# Patient Record
Sex: Female | Born: 1965 | Race: Black or African American | Hispanic: No | Marital: Single | Smoking: Never smoker
Health system: Southern US, Community
[De-identification: ages and names within clinical notes are randomized; demographics above are authoritative.]

## PROBLEM LIST (undated history)

## (undated) DIAGNOSIS — K219 Gastro-esophageal reflux disease without esophagitis: Secondary | ICD-10-CM

## (undated) DIAGNOSIS — E669 Obesity, unspecified: Secondary | ICD-10-CM

## (undated) DIAGNOSIS — G709 Myoneural disorder, unspecified: Secondary | ICD-10-CM

## (undated) DIAGNOSIS — R7303 Prediabetes: Secondary | ICD-10-CM

## (undated) DIAGNOSIS — D649 Anemia, unspecified: Secondary | ICD-10-CM

## (undated) DIAGNOSIS — I10 Essential (primary) hypertension: Secondary | ICD-10-CM

## (undated) DIAGNOSIS — M1990 Unspecified osteoarthritis, unspecified site: Secondary | ICD-10-CM

## (undated) DIAGNOSIS — J45909 Unspecified asthma, uncomplicated: Secondary | ICD-10-CM

## (undated) DIAGNOSIS — T7840XA Allergy, unspecified, initial encounter: Secondary | ICD-10-CM

## (undated) HISTORY — DX: Gastro-esophageal reflux disease without esophagitis: K21.9

## (undated) HISTORY — DX: Myoneural disorder, unspecified: G70.9

## (undated) HISTORY — DX: Prediabetes: R73.03

## (undated) HISTORY — DX: Obesity, unspecified: E66.9

## (undated) HISTORY — DX: Unspecified osteoarthritis, unspecified site: M19.90

## (undated) HISTORY — DX: Allergy, unspecified, initial encounter: T78.40XA

---

## 2007-06-27 ENCOUNTER — Emergency Department (HOSPITAL_COMMUNITY): Admission: EM | Admit: 2007-06-27 | Discharge: 2007-06-27 | Payer: Self-pay | Admitting: Family Medicine

## 2007-07-20 ENCOUNTER — Ambulatory Visit: Payer: Self-pay | Admitting: Family Medicine

## 2007-07-26 ENCOUNTER — Ambulatory Visit (HOSPITAL_COMMUNITY): Admission: RE | Admit: 2007-07-26 | Discharge: 2007-07-26 | Payer: Self-pay | Admitting: Family Medicine

## 2007-08-02 ENCOUNTER — Encounter: Admission: RE | Admit: 2007-08-02 | Discharge: 2007-10-18 | Payer: Self-pay | Admitting: Family Medicine

## 2007-09-28 ENCOUNTER — Ambulatory Visit: Payer: Self-pay | Admitting: Internal Medicine

## 2008-03-02 ENCOUNTER — Ambulatory Visit: Payer: Self-pay | Admitting: Internal Medicine

## 2008-03-03 ENCOUNTER — Encounter (INDEPENDENT_AMBULATORY_CARE_PROVIDER_SITE_OTHER): Payer: Self-pay | Admitting: Internal Medicine

## 2008-03-03 LAB — CONVERTED CEMR LAB
AST: 10 units/L (ref 0–37)
Basophils Absolute: 0 10*3/uL (ref 0.0–0.1)
CO2: 24 meq/L (ref 19–32)
Chloride: 104 meq/L (ref 96–112)
Cholesterol: 168 mg/dL (ref 0–200)
Creatinine, Ser: 0.79 mg/dL (ref 0.40–1.20)
Glucose, Bld: 97 mg/dL (ref 70–99)
LDL Cholesterol: 97 mg/dL (ref 0–99)
Lymphs Abs: 2.3 10*3/uL (ref 0.7–4.0)
Microalb, Ur: 73.1 mg/dL — ABNORMAL HIGH (ref 0.00–1.89)
Monocytes Absolute: 0.6 10*3/uL (ref 0.1–1.0)
Monocytes Relative: 9 % (ref 3–12)
Potassium: 4.1 meq/L (ref 3.5–5.3)
RBC: 4.79 M/uL (ref 3.87–5.11)
Sodium: 139 meq/L (ref 135–145)

## 2008-03-08 ENCOUNTER — Ambulatory Visit (HOSPITAL_COMMUNITY): Admission: RE | Admit: 2008-03-08 | Discharge: 2008-03-08 | Payer: Self-pay | Admitting: Internal Medicine

## 2008-03-13 ENCOUNTER — Encounter (INDEPENDENT_AMBULATORY_CARE_PROVIDER_SITE_OTHER): Payer: Self-pay | Admitting: Internal Medicine

## 2008-03-13 LAB — CONVERTED CEMR LAB
Creatinine, Urine: 108.5 mg/dL
Protein, Ur: 160 mg/24hr — ABNORMAL HIGH (ref 50–100)

## 2008-03-20 ENCOUNTER — Ambulatory Visit: Payer: Self-pay | Admitting: Internal Medicine

## 2008-03-20 LAB — CONVERTED CEMR LAB
RPR Ser Ql: REACTIVE — AB
RPR Titer: 1:2 {titer}
Vit D, 1,25-Dihydroxy: 18 — ABNORMAL LOW (ref 30–89)

## 2008-03-22 ENCOUNTER — Ambulatory Visit (HOSPITAL_COMMUNITY): Admission: RE | Admit: 2008-03-22 | Discharge: 2008-03-22 | Payer: Self-pay | Admitting: Family Medicine

## 2008-03-23 ENCOUNTER — Ambulatory Visit (HOSPITAL_COMMUNITY): Admission: RE | Admit: 2008-03-23 | Discharge: 2008-03-23 | Payer: Self-pay | Admitting: Internal Medicine

## 2012-02-15 ENCOUNTER — Emergency Department (INDEPENDENT_AMBULATORY_CARE_PROVIDER_SITE_OTHER)
Admission: EM | Admit: 2012-02-15 | Discharge: 2012-02-15 | Disposition: A | Discharge status: Home / Self Care | Payer: Self-pay | Source: Home / Self Care | Attending: Emergency Medicine | Admitting: Emergency Medicine

## 2012-02-15 ENCOUNTER — Encounter (HOSPITAL_COMMUNITY): Payer: Self-pay

## 2012-02-15 DIAGNOSIS — J3489 Other specified disorders of nose and nasal sinuses: Secondary | ICD-10-CM

## 2012-02-15 DIAGNOSIS — J029 Acute pharyngitis, unspecified: Secondary | ICD-10-CM

## 2012-02-15 DIAGNOSIS — R0981 Nasal congestion: Secondary | ICD-10-CM

## 2012-02-15 HISTORY — DX: Unspecified asthma, uncomplicated: J45.909

## 2012-02-15 MED ORDER — FEXOFENADINE-PSEUDOEPHED ER 60-120 MG PO TB12: 1.0000 | ORAL_TABLET | Freq: Two times a day (BID) | ORAL | Status: AC

## 2012-02-15 NOTE — ED Provider Notes (Signed)
History     CSN: [id number]  Arrival date & time 02/15/12  1057   First MD Initiated Contact with Patient 02/15/12 1116      Chief Complaint  Patient presents with  . URI    (Consider location/radiation/quality/duration/timing/severity/associated sxs/prior treatment) HPI Comments: The patient presents urgent care describing that for about 3 days she was having a sore throat she felt warm at one 0.2 days ago and has been having a runny nose with active congestion, scratchy throat and eyes irritated and tearing frequently. Patient denies any shortness of breath, wheezing abdominal pain nausea or vomiting. She thinks she might have allergies or may be as a cold but is not sure. She was told by her employer that she needed to bring a note stating that she can go back and she is no longer contagious.  Patient is a 46 y.o. female presenting with URI. The history is provided by the patient.  URI The primary symptoms include fever, sore throat and cough. Primary symptoms do not include wheezing, myalgias, arthralgias or rash. The current episode started 2 days ago. This is a new problem.  The sore throat is not accompanied by stridor.    Past Medical History  Diagnosis Date  . Asthma     History reviewed. No pertinent past surgical history.  History reviewed. No pertinent family history.  History  Substance Use Topics  . Smoking status: Never Smoker   . Smokeless tobacco: Not on file  . Alcohol Use: No    OB History    Grav Para Term Preterm Abortions TAB SAB Ect Mult Living                  Review of Systems  Constitutional: Positive for fever.  HENT: Positive for sore throat.   Eyes: Positive for itching. Negative for photophobia and redness.  Respiratory: Positive for cough. Negative for chest tightness, shortness of breath, wheezing and stridor.   Musculoskeletal: Negative for myalgias and arthralgias.  Skin: Negative for color change, rash and wound.  Neurological:  Negative for light-headedness.    Allergies  Penicillins  Home Medications   Current Outpatient Rx  Name Route Sig Dispense Refill  . FEXOFENADINE-PSEUDOEPHED ER 60-120 MG PO TB12 Oral Take 1 tablet by mouth every 12 (twelve) hours. 14 tablet 0    BP 143/76  Pulse 75  Temp 98.5 F (36.9 C) (Oral)  Resp 12  SpO2 100%  Physical Exam  Nursing note and vitals reviewed. Constitutional: Vital signs are normal. She appears well-developed and well-nourished.  Non-toxic appearance. She does not have a sickly appearance. She does not appear ill. No distress.  HENT:  Mouth/Throat: Uvula is midline, oropharynx is clear and moist and mucous membranes are normal. No oropharyngeal exudate, posterior oropharyngeal edema, posterior oropharyngeal erythema or tonsillar abscesses.  Eyes: Conjunctivae are normal. Right eye exhibits no discharge. Left eye exhibits no discharge.  Neck: Neck supple. No JVD present.  Pulmonary/Chest: Effort normal and breath sounds normal. She has no decreased breath sounds.  Abdominal: Soft.  Musculoskeletal: Normal range of motion.  Lymphadenopathy:    She has no cervical adenopathy.  Neurological: She is alert.  Skin: No rash noted. No erythema.    ED Course  Procedures (including critical care time)  Labs Reviewed - No data to display No results found.   1. Nasal congestion   2. Sore throat       MDM  Patient symptomatic for 3 days. Rhinitis, mild cough and sore  throat. Physical exam was unremarkable patient is afebrile. Prescription for Allegra-D was prescribed for her to use for 5-7 days.        Jimmie Molly, MD 02/15/12 [phone]

## 2012-02-15 NOTE — ED Notes (Signed)
States she has been sick for 3 days; her job wants a note to say she is not contagious, as she works w food at [employer]; congestion, eyes watering; no relief w [facility]

## 2012-10-26 ENCOUNTER — Emergency Department (INDEPENDENT_AMBULATORY_CARE_PROVIDER_SITE_OTHER): Payer: Self-pay

## 2012-10-26 ENCOUNTER — Emergency Department (INDEPENDENT_AMBULATORY_CARE_PROVIDER_SITE_OTHER)
Admission: EM | Admit: 2012-10-26 | Discharge: 2012-10-26 | Disposition: A | Discharge status: Home / Self Care | Payer: Self-pay | Source: Home / Self Care | Attending: Family Medicine | Admitting: Family Medicine

## 2012-10-26 DIAGNOSIS — S93409A Sprain of unspecified ligament of unspecified ankle, initial encounter: Secondary | ICD-10-CM

## 2012-10-26 DIAGNOSIS — S93401A Sprain of unspecified ligament of right ankle, initial encounter: Secondary | ICD-10-CM

## 2012-10-26 MED ORDER — AZITHROMYCIN 250 MG PO TABS: 250.0000 mg | ORAL_TABLET | Freq: Every day | ORAL | Status: DC

## 2012-10-26 MED ORDER — IBUPROFEN 800 MG PO TABS: 800.0000 mg | ORAL_TABLET | Freq: Three times a day (TID) | ORAL | Status: DC

## 2012-10-26 NOTE — ED Notes (Signed)
Pt c/o right ankle inj onset yest 8am States she twisted her ankle while at work Sx include: pain, swelling; hurts to bear pressure   She is alert and oriented w/no signs of acute distress.

## 2012-10-26 NOTE — ED Provider Notes (Signed)
History     CSN: [id number]  Arrival date & time 10/26/12  1108   First MD Initiated Contact with Patient 10/26/12 1235      Chief Complaint  Patient presents with  . Foot Injury    (Consider location/radiation/quality/duration/timing/severity/associated sxs/prior treatment) Patient is a 47 y.o. female presenting with ankle pain. The history is provided by the patient. No language interpreter was used.  Ankle Pain Location:  Ankle Ankle location:  R ankle Pain details:    Quality:  Aching and throbbing   Severity:  Moderate   Onset quality:  Sudden   Timing:  Constant Chronicity:  New Prior injury to area:  Yes Relieved by:  None tried  Pt also complains of a cough for the past 2 weeks.  Pt reports clear production.  No fever. Past Medical History  Diagnosis Date  . Asthma     No past surgical history on file.  No family history on file.  History  Substance Use Topics  . Smoking status: Never Smoker   . Smokeless tobacco: Not on file  . Alcohol Use: No    OB History   Grav Para Term Preterm Abortions TAB SAB Ect Mult Living                  Review of Systems  Musculoskeletal: Positive for myalgias and joint swelling.  All other systems reviewed and are negative.    Allergies  Penicillins  Home Medications   Current Outpatient Rx  Name  Route  Sig  Dispense  Refill  . fexofenadine-pseudoephedrine (ALLEGRA-D) 60-120 MG per tablet   Oral   Take 1 tablet by mouth every 12 (twelve) hours.   14 tablet   0     BP 137/69  Pulse 73  Temp(Src) 97.9 F (36.6 C) (Oral)  Resp 16  SpO2 100%  LMP 10/01/2012  Physical Exam  Nursing note and vitals reviewed. Constitutional: She is oriented to person, place, and time. She appears well-developed and well-nourished.  Eyes: Pupils are equal, round, and reactive to light.  Neck: Normal range of motion.  Cardiovascular: Normal rate and regular rhythm.   Pulmonary/Chest: Effort normal and breath sounds  normal.  Abdominal: Soft.  Musculoskeletal: She exhibits tenderness.  Swollen tender ankle,  From with pain,  nv and ns intact  Neurological: She is alert and oriented to person, place, and time. She has normal reflexes.  Skin: Skin is warm.  Psychiatric: She has a normal mood and affect.    ED Course  Procedures (including critical care time)  Labs Reviewed - No data to display Dg Foot Complete Right  10/26/2012  *RADIOLOGY REPORT*  Clinical Data: Pain post trauma  RIGHT FOOT COMPLETE - 3+ VIEW  Comparison: None.  Findings:  Frontal, oblique, lateral views were obtained.  There is no fracture or dislocation.  Joint spaces appear intact.  There is spurring in the dorsal mid foot.  No erosive change.  IMPRESSION: Mild arthropathy.  No fracture or dislocation.   Original Report Authenticated By: Bretta Bang, M.D.      No diagnosis found.    MDM  aso and crutches       Elson Areas, PA-C 10/26/12 1407  Lonia Skinner Oberlin, PA-C 10/26/12 1421

## 2012-10-27 NOTE — ED Provider Notes (Signed)
Medical screening examination/treatment/procedure(s) were performed by non-physician practitioner and as supervising physician I was immediately available for consultation/collaboration.   [facility]; MD  Sharin Grave, MD 10/27/12 1036

## 2013-04-26 ENCOUNTER — Encounter (HOSPITAL_COMMUNITY): Payer: Self-pay | Admitting: Emergency Medicine

## 2013-04-26 ENCOUNTER — Emergency Department (INDEPENDENT_AMBULATORY_CARE_PROVIDER_SITE_OTHER)
Admission: EM | Admit: 2013-04-26 | Discharge: 2013-04-26 | Disposition: A | Discharge status: Home / Self Care | Payer: Self-pay | Source: Home / Self Care | Attending: Family Medicine | Admitting: Family Medicine

## 2013-04-26 DIAGNOSIS — L501 Idiopathic urticaria: Secondary | ICD-10-CM

## 2013-04-26 MED ORDER — TRIAMCINOLONE ACETONIDE 40 MG/ML IJ SUSP
40.0000 mg | Freq: Once | INTRAMUSCULAR | Status: AC
  Administered 2013-04-26: 40 mg via INTRAMUSCULAR

## 2013-04-26 MED ORDER — HYDROXYZINE HCL 25 MG PO TABS: 25.0000 mg | ORAL_TABLET | Freq: Four times a day (QID) | ORAL | Status: DC

## 2013-04-26 MED ORDER — TRIAMCINOLONE ACETONIDE 40 MG/ML IJ SUSP
INTRAMUSCULAR | Status: AC
  Filled 2013-04-26: qty 1

## 2013-04-26 NOTE — ED Provider Notes (Addendum)
CSN: [id number]     Arrival date & time 04/26/13  1705 History   First MD Initiated Contact with Patient 04/26/13 1801     Chief Complaint  Patient presents with  . Rash   (Consider location/radiation/quality/duration/timing/severity/associated sxs/prior Treatment) Patient is a 47 y.o. female presenting with rash. The history is provided by the patient.  Rash Pain severity:  No pain Onset quality:  Sudden Duration:  8 hours Progression:  Improving Chronicity:  New   Past Medical History  Diagnosis Date  . Asthma    History reviewed. No pertinent past surgical history. History reviewed. No pertinent family history. History  Substance Use Topics  . Smoking status: Never Smoker   . Smokeless tobacco: Not on file  . Alcohol Use: No   OB History   Grav Para Term Preterm Abortions TAB SAB Ect Mult Living                 Review of Systems  Constitutional: Negative.   Gastrointestinal: Negative.   Genitourinary: Negative.   Musculoskeletal: Negative.   Skin: Positive for rash.    Allergies  Penicillins  Home Medications   Current Outpatient Rx  Name  Route  Sig  Dispense  Refill  . azithromycin (ZITHROMAX) 250 MG tablet   Oral   Take 1 tablet (250 mg total) by mouth daily. Take first 2 tablets together, then 1 every day until finished.   6 tablet   0   . hydrOXYzine (ATARAX/VISTARIL) 25 MG tablet   Oral   Take 1 tablet (25 mg total) by mouth every 6 (six) hours. For hives and itching   30 tablet   0   . ibuprofen (ADVIL,MOTRIN) 800 MG tablet   Oral   Take 1 tablet (800 mg total) by mouth 3 (three) times daily.   21 tablet   0    BP 146/87  Pulse 75  Temp(Src) 98.6 F (37 C) (Oral)  Resp 16  SpO2 99%  LMP 04/04/2013 Physical Exam  Nursing note and vitals reviewed. Constitutional: She is oriented to person, place, and time. She appears well-developed and well-nourished.  HENT:  Mouth/Throat: Oropharynx is clear and moist.  Neck: Normal range of  motion. Neck supple.  Pulmonary/Chest: Effort normal and breath sounds normal.  Neurological: She is alert and oriented to person, place, and time.  Skin: Skin is warm and dry. Rash noted.  Scattered hives on bilat upper ext, prutitic, off and on migratory appearance.    ED Course  Procedures (including critical care time) Labs Review Labs Reviewed - No data to display Imaging Review No results found.  EKG Interpretation     Ventricular Rate:    PR Interval:    QRS Duration:   QT Interval:    QTC Calculation:   R Axis:     Text Interpretation:              MDM      Linna Hoff, MD 04/26/13 2004  Linna Hoff, MD 04/26/13 2005

## 2013-04-26 NOTE — ED Notes (Signed)
C/o rash/bumps all over body which started early this morning.  States bumps does itch

## 2013-04-27 ENCOUNTER — Emergency Department (HOSPITAL_COMMUNITY)
Admission: EM | Admit: 2013-04-27 | Discharge: 2013-04-27 | Disposition: A | Discharge status: Home / Self Care | Payer: Self-pay

## 2013-04-27 ENCOUNTER — Other Ambulatory Visit: Payer: Self-pay

## 2014-07-07 ENCOUNTER — Emergency Department (INDEPENDENT_AMBULATORY_CARE_PROVIDER_SITE_OTHER): Payer: Self-pay

## 2014-07-07 ENCOUNTER — Emergency Department (INDEPENDENT_AMBULATORY_CARE_PROVIDER_SITE_OTHER)
Admission: EM | Admit: 2014-07-07 | Discharge: 2014-07-07 | Disposition: A | Discharge status: Home / Self Care | Payer: Self-pay | Source: Home / Self Care | Attending: Emergency Medicine | Admitting: Emergency Medicine

## 2014-07-07 ENCOUNTER — Encounter (HOSPITAL_COMMUNITY): Payer: Self-pay | Admitting: *Deleted

## 2014-07-07 DIAGNOSIS — M79673 Pain in unspecified foot: Secondary | ICD-10-CM

## 2014-07-07 DIAGNOSIS — M79671 Pain in right foot: Secondary | ICD-10-CM

## 2014-07-07 DIAGNOSIS — M2141 Flat foot [pes planus] (acquired), right foot: Secondary | ICD-10-CM

## 2014-07-07 DIAGNOSIS — M2142 Flat foot [pes planus] (acquired), left foot: Secondary | ICD-10-CM

## 2014-07-07 MED ORDER — TRAMADOL HCL 50 MG PO TABS: 50.0000 mg | ORAL_TABLET | Freq: Four times a day (QID) | ORAL | Status: DC | PRN

## 2014-07-07 NOTE — ED Notes (Signed)
Discoloration R foot over instep onset 1 month ago.  No known injury.  Is on her feet at work all the time.  Pain got worse in Dec.

## 2014-07-07 NOTE — Discharge Instructions (Signed)
You have some arthritis in your foot.  The arthritis and loss of your arches are contributing to your foot pain. Try getting an [facility] to be [facility] for orthotics. You can also try an arch support orthotic at [facility] When you start wearing the orthotic, start with 2 hours a day and gradually increase until you are wearing them all day.  Follow up as needed.

## 2014-07-07 NOTE — ED Provider Notes (Signed)
CSN: [id number]     Arrival date & time 07/07/14  1422 History   First MD Initiated Contact with Patient 07/07/14 1457     Chief Complaint  Patient presents with  . Foot Injury   (Consider location/radiation/quality/duration/timing/severity/associated sxs/prior Treatment) HPI  She is a 49 year old woman here for evaluation of right foot pain. This started about a month ago and has gradually been getting worse. She has noted some swelling over the medial foot. The pain is located at the medial foot where it joins the ankle. The pain will sometimes shoot all the way up her leg. She does work on her feet all day. She does not remember a specific injury. She has not had a recent increase in weightbearing activity.  Past Medical History  Diagnosis Date  . Asthma    History reviewed. No pertinent past surgical history. Family History  Problem Relation Age of Onset  . Diabetes Mother   . Kidney failure Mother   . Cancer Father     prostate   History  Substance Use Topics  . Smoking status: Never Smoker   . Smokeless tobacco: Not on file  . Alcohol Use: No   OB History    No data available     Review of Systems As in history of present illness Allergies  Penicillins  Home Medications   Prior to Admission medications   Medication Sig Start Date End Date Taking? Authorizing Provider  ibuprofen (ADVIL,MOTRIN) 200 MG tablet Take 800 mg by mouth every 6 (six) hours as needed for moderate pain.   Yes Historical Provider, MD  azithromycin (ZITHROMAX) 250 MG tablet Take 1 tablet (250 mg total) by mouth daily. Take first 2 tablets together, then 1 every day until finished. 10/26/12   Elson AreasLeslie K Sofia, PA-C  hydrOXYzine (ATARAX/VISTARIL) 25 MG tablet Take 1 tablet (25 mg total) by mouth every 6 (six) hours. For hives and itching 04/26/13   Linna HoffJames D Kindl, MD  ibuprofen (ADVIL,MOTRIN) 800 MG tablet Take 1 tablet (800 mg total) by mouth 3 (three) times daily. 10/26/12   Elson AreasLeslie K Sofia, PA-C   traMADol (ULTRAM) 50 MG tablet Take 1 tablet (50 mg total) by mouth every 6 (six) hours as needed. 07/07/14   Charm RingsErin J Honig, MD   BP 148/85 mmHg  Pulse 68  Temp(Src) 98.6 F (37 C) (Oral)  Resp 12  SpO2 100%  LMP 07/07/2014 Physical Exam  Constitutional: She is oriented to person, place, and time. She appears well-developed and well-nourished. No distress.  Cardiovascular: Normal rate.   Pulmonary/Chest: Effort normal.  Musculoskeletal:  Right foot: Mild swelling over the medial proximal foot. Tender to palpation over the deltoid ligaments. She has 5 out of 5 strength in dorsiflexion, plantar flexion, inversion, fevers and. Pain with resisted dorsiflexion and inversion. 2+ DP pulse. Bilateral pes planus. Also some splaying of the toes.  Neurological: She is alert and oriented to person, place, and time.    ED Course  Procedures (including critical care time) Labs Review Labs Reviewed - No data to display  Imaging Review Dg Foot Complete Right  07/07/2014   CLINICAL DATA:  Initial encounter for right foot pain for 2 months without trauma.  EXAM: RIGHT FOOT COMPLETE - 3+ VIEW  COMPARISON:  10/26/2012  FINDINGS: Mild pes planus deformity. Small calcaneal spur. Mild midfoot osteoarthritis. Apparent soft tissue swelling about the hindfoot. This was present on the prior exam. No periosteal reaction or callus deposition.  IMPRESSION: No acute osseous abnormality.  Hindfoot and ankle soft tissue swelling, nonspecific and felt to be grossly similar.   Electronically Signed   By: Jeronimo Greaves M.D.   On: 07/07/2014 16:07     MDM   1. Right foot pain   2. Foot pain   3. Foot pain   4. Pes planus of both feet    Her pain is likely secondary to arthritis and loss of the normal foot architecture. Prescription for tramadol to use as needed for severe pain. Recommended follow-up at the sports medicine center for evaluation for orthotics. As she does not have insurance right now, she could also  try an arch support insole from Dr. Margart Sickles. Follow-up as needed.    Charm Rings, MD 07/07/14 [phone]

## 2015-03-23 ENCOUNTER — Emergency Department (HOSPITAL_COMMUNITY)
Admission: EM | Admit: 2015-03-23 | Discharge: 2015-03-23 | Disposition: A | Discharge status: Home / Self Care | Payer: Self-pay | Attending: Emergency Medicine | Admitting: Emergency Medicine

## 2015-03-23 ENCOUNTER — Encounter (HOSPITAL_COMMUNITY): Payer: Self-pay | Admitting: Nurse Practitioner

## 2015-03-23 DIAGNOSIS — Z79899 Other long term (current) drug therapy: Secondary | ICD-10-CM | POA: Insufficient documentation

## 2015-03-23 DIAGNOSIS — R109 Unspecified abdominal pain: Secondary | ICD-10-CM | POA: Insufficient documentation

## 2015-03-23 DIAGNOSIS — J45909 Unspecified asthma, uncomplicated: Secondary | ICD-10-CM | POA: Insufficient documentation

## 2015-03-23 DIAGNOSIS — R350 Frequency of micturition: Secondary | ICD-10-CM | POA: Insufficient documentation

## 2015-03-23 DIAGNOSIS — Z791 Long term (current) use of non-steroidal anti-inflammatories (NSAID): Secondary | ICD-10-CM | POA: Insufficient documentation

## 2015-03-23 DIAGNOSIS — N939 Abnormal uterine and vaginal bleeding, unspecified: Secondary | ICD-10-CM | POA: Insufficient documentation

## 2015-03-23 DIAGNOSIS — Z3202 Encounter for pregnancy test, result negative: Secondary | ICD-10-CM | POA: Insufficient documentation

## 2015-03-23 DIAGNOSIS — Z880 Allergy status to penicillin: Secondary | ICD-10-CM | POA: Insufficient documentation

## 2015-03-23 DIAGNOSIS — Z862 Personal history of diseases of the blood and blood-forming organs and certain disorders involving the immune mechanism: Secondary | ICD-10-CM | POA: Insufficient documentation

## 2015-03-23 DIAGNOSIS — R42 Dizziness and giddiness: Secondary | ICD-10-CM | POA: Insufficient documentation

## 2015-03-23 HISTORY — DX: Anemia, unspecified: D64.9

## 2015-03-23 LAB — I-STAT CHEM 8, ED
BUN: 14 mg/dL (ref 6–20)
CREATININE: 0.8 mg/dL (ref 0.44–1.00)
Calcium, Ion: 1.23 mmol/L (ref 1.12–1.23)
Chloride: 103 mmol/L (ref 101–111)
Glucose, Bld: 92 mg/dL (ref 65–99)
HEMATOCRIT: 34 % — AB (ref 36.0–46.0)
HEMOGLOBIN: 11.6 g/dL — AB (ref 12.0–15.0)
Potassium: 3.8 mmol/L (ref 3.5–5.1)
SODIUM: 139 mmol/L (ref 135–145)
TCO2: 24 mmol/L (ref 0–100)

## 2015-03-23 LAB — URINALYSIS, ROUTINE W REFLEX MICROSCOPIC
BILIRUBIN URINE: NEGATIVE
Glucose, UA: NEGATIVE mg/dL
KETONES UR: NEGATIVE mg/dL
NITRITE: NEGATIVE
PH: 5.5 (ref 5.0–8.0)
Protein, ur: NEGATIVE mg/dL
Specific Gravity, Urine: 1.02 (ref 1.005–1.030)
Urobilinogen, UA: 0.2 mg/dL (ref 0.0–1.0)

## 2015-03-23 LAB — CBC
HEMATOCRIT: 31 % — AB (ref 36.0–46.0)
Hemoglobin: 9.5 g/dL — ABNORMAL LOW (ref 12.0–15.0)
MCH: 21.2 pg — ABNORMAL LOW (ref 26.0–34.0)
MCHC: 30.6 g/dL (ref 30.0–36.0)
MCV: 69 fL — ABNORMAL LOW (ref 78.0–100.0)
Platelets: 281 10*3/uL (ref 150–400)
RBC: 4.49 MIL/uL (ref 3.87–5.11)
RDW: 18 % — AB (ref 11.5–15.5)
WBC: 5 10*3/uL (ref 4.0–10.5)

## 2015-03-23 LAB — I-STAT BETA HCG BLOOD, ED (MC, WL, AP ONLY)

## 2015-03-23 LAB — URINE MICROSCOPIC-ADD ON

## 2015-03-23 MED ORDER — MEGESTROL ACETATE 40 MG/ML PO SUSP
100.0000 mg | Freq: Every day | ORAL | Status: DC
  Administered 2015-03-23: 100 mg via ORAL
  Filled 2015-03-23: qty 5

## 2015-03-23 MED ORDER — MEGESTROL ACETATE 40 MG PO TABS: 40.0000 mg | ORAL_TABLET | Freq: Two times a day (BID) | ORAL | Status: DC

## 2015-03-23 NOTE — ED Provider Notes (Signed)
CSN: [id number]     Arrival date & time 03/23/15  1220 History   First MD Initiated Contact with Patient 03/23/15 1251     Chief Complaint  Patient presents with  . Vaginal Bleeding   Tammie Solis is a 49 y.o. female with a history of asthma and anemia who presents to the ED complaining of heavy vaginal bleeding since yesterday. The patient reports she's had intermittent vaginal bleeding for the past month. She reports she had 2 weeks of bleeding approximately 2 weeks ago. She reports for the last week she's had no bleeding and then yesterday she started having heavy vaginal bleeding again. She reports she has been changing her pad almost every 30 minutes today. She complains of midline low abdominal cramping currently. Patient also reports feeling lightheaded with position change today. She also reports urinary frequency since yesterday but no other urinary symptoms. The patient reports she still has regular menstrual cycles up until this past month. The patient is not followed by an OB/GYN. The patient denies fevers, chills, loss of consciousness, shortness of breath, chest pain, nausea, vomiting, diarrhea, dysuria, rashes or recent sexual activity.  (Consider location/radiation/quality/duration/timing/severity/associated sxs/prior Treatment) HPI  Past Medical History  Diagnosis Date  . Asthma   . Anemia    History reviewed. No pertinent past surgical history. Family History  Problem Relation Age of Onset  . Diabetes Mother   . Kidney failure Mother   . Cancer Father     prostate   Social History  Substance Use Topics  . Smoking status: Never Smoker   . Smokeless tobacco: None  . Alcohol Use: No   OB History    No data available     Review of Systems  Constitutional: Negative for fever and chills.  HENT: Negative for congestion and sore throat.   Eyes: Negative for visual disturbance.  Respiratory: Negative for cough and shortness of breath.   Cardiovascular: Negative for  chest pain and palpitations.  Gastrointestinal: Positive for abdominal pain. Negative for nausea, vomiting, diarrhea and blood in stool.  Genitourinary: Positive for frequency, vaginal bleeding and menstrual problem. Negative for dysuria, urgency, flank pain, decreased urine volume, vaginal discharge, difficulty urinating and genital sores.  Musculoskeletal: Negative for back pain and neck pain.  Skin: Negative for rash.  Neurological: Positive for light-headedness. Negative for syncope, weakness, numbness and headaches.      Allergies  Penicillins  Home Medications   Prior to Admission medications   Medication Sig Start Date End Date Taking? Authorizing Provider  azithromycin (ZITHROMAX) 250 MG tablet Take 1 tablet (250 mg total) by mouth daily. Take first 2 tablets together, then 1 every day until finished. 10/26/12   Elson Areas, PA-C  hydrOXYzine (ATARAX/VISTARIL) 25 MG tablet Take 1 tablet (25 mg total) by mouth every 6 (six) hours. For hives and itching 04/26/13   Linna Hoff, MD  ibuprofen (ADVIL,MOTRIN) 200 MG tablet Take 800 mg by mouth every 6 (six) hours as needed for moderate pain.    Historical Provider, MD  ibuprofen (ADVIL,MOTRIN) 800 MG tablet Take 1 tablet (800 mg total) by mouth 3 (three) times daily. 10/26/12   Elson Areas, PA-C  megestrol (MEGACE) 40 MG tablet Take 1 tablet (40 mg total) by mouth 2 (two) times daily. 03/23/15   Everlene Farrier, PA-C  traMADol (ULTRAM) 50 MG tablet Take 1 tablet (50 mg total) by mouth every 6 (six) hours as needed. 07/07/14   Charm Rings, MD   BP 117/70  mmHg  Pulse 74  Temp(Src) 98 F (36.7 C) (Oral)  Resp 17  SpO2 98% Physical Exam  Constitutional: She is oriented to person, place, and time. She appears well-developed and well-nourished. No distress.  Nontoxic appearing.  HENT:  Head: Normocephalic and atraumatic.  Mouth/Throat: Oropharynx is clear and moist.  Eyes: Conjunctivae are normal. Pupils are equal, round, and  reactive to light. Right eye exhibits no discharge. Left eye exhibits no discharge.  Neck: Neck supple.  Cardiovascular: Normal rate, regular rhythm, normal heart sounds and intact distal pulses.  Exam reveals no gallop and no friction rub.   No murmur heard. Pulmonary/Chest: Effort normal and breath sounds normal. No respiratory distress. She has no wheezes. She has no rales.  Abdominal: Soft. Bowel sounds are normal. She exhibits no distension and no mass. There is tenderness. There is no guarding.  Abdomen is soft. Bowel sounds are present. Patient has some mild suprapubic abdominal tenderness to palpation. No lateral tenderness appreciated. No right lower quadrant abdominal tenderness.  Genitourinary:  Pelvic exam performed by me with female nurse chaperone. No external lesions or rashes noted. The patient has a moderate amount of blood in her vaginal vault. No vaginal lesions noted. No cervical lacerations noted. Patient's cervix is closed. Patient's cervix is oozing some mild amount of blood and her vaginal vault. There is suprapubic abdominal tenderness, but no adnexal tenderness or fullness.   Musculoskeletal: She exhibits no edema or tenderness.  No lower extremity edema or tenderness.  Lymphadenopathy:    She has no cervical adenopathy.  Neurological: She is alert and oriented to person, place, and time. Coordination normal.  Skin: Skin is warm and dry. No rash noted. She is not diaphoretic. No erythema. No pallor.  Psychiatric: She has a normal mood and affect. Her behavior is normal.  Nursing note and vitals reviewed.   ED Course  Procedures (including critical care time) Labs Review Labs Reviewed  CBC - Abnormal; Notable for the following:    Hemoglobin 9.5 (*)    HCT 31.0 (*)    MCV 69.0 (*)    MCH 21.2 (*)    RDW 18.0 (*)    All other components within normal limits  URINALYSIS, ROUTINE W REFLEX MICROSCOPIC (NOT AT [facility]) - Abnormal; Notable for the following:    Color,  Urine RED (*)    APPearance CLOUDY (*)    Hgb urine dipstick LARGE (*)    Leukocytes, UA SMALL (*)    All other components within normal limits  I-STAT CHEM 8, ED - Abnormal; Notable for the following:    Hemoglobin 11.6 (*)    HCT 34.0 (*)    All other components within normal limits  URINE CULTURE  URINE MICROSCOPIC-ADD ON  I-STAT BETA HCG BLOOD, ED ([facility], [facility], [facility] ONLY)    Imaging Review No results found.    EKG Interpretation None      Filed Vitals:   03/23/15 1235 03/23/15 1453  BP: 144/71 117/70  Pulse: 83 74  Temp: 98.4 F (36.9 C) 98 F (36.7 C)  TempSrc: Oral Oral  Resp: 17 17  SpO2: 100% 98%     MDM   Meds given in ED:  Medications  megestrol (MEGACE) 40 MG/ML suspension 100 mg (not administered)    New Prescriptions   MEGESTROL (MEGACE) 40 MG TABLET    Take 1 tablet (40 mg total) by mouth 2 (two) times daily.    Final diagnoses:  Vaginal bleeding   This  is  a 49 y.o. female with a history of asthma and anemia who presents to the ED complaining of heavy vaginal bleeding since yesterday. The patient reports she's had intermittent vaginal bleeding for the past month. She reports she had 2 weeks of bleeding approximately 2 weeks ago. She reports for the last week she's had no bleeding and then yesterday she started having heavy vaginal bleeding again. She reports she has been changing her pad almost every 30 minutes today. She complains of midline low abdominal cramping currently. Patient also complains of some urinary frequency starting today but otherwise no urinary symptoms. On exam the patient is afebrile nontoxic appearing. Her abdomen is soft and she has a mild suprapubic abdominal tenderness to palpation without peritoneal signs. No adnexal tenderness or fullness. On pelvic exam the patient has no external lesions are lacerations noted. Patient has a moderate amount of blood in her vaginal vault. Her cervix is closed and using a small amount of blood into her  vaginal vault. It is not briskly filling the vaginal vault. There is no evidence of cervical laceration. No cervical motion tenderness. No adnexal tenderness or fullness. Mild suprapubic tenderness to palpation. She is a negative i-STAT hCG. CBC reveals a hemoglobin of 9.5 with a hematocrit of 31. This is similar to her CBC in 2009. I-STAT Chem-8 is unremarkable. Urinalysis is markedly for small leukocytes and large hemoglobin.  Nitrite negative. Will send urine for culture. Orthostatic vital signs were obtained which were negative for orthostatics. Will provide Megace 100 mg in the ED. reevaluation the patient reports she is ambulated to the bathroom and did not feel lightheaded. She feels comfortable being discharged. I discussed very strict return precautions and specific reasons she should return to the ED immediately. We'll discharge with prescription for Megace 40 mg twice a day and I advised her to follow-up with OB/GYN at the [facility] outpatient clinic in 2 days, this Monday. She reports she will be able to do this. I advised the patient to follow-up with their primary care provider this week. I advised the patient to return to the emergency department with new or worsening symptoms or new concerns. The patient verbalized understanding and agreement with plan.     This patient was discussed with Dr. Hyacinth Meeker who agrees with assessment and plan.    Everlene Farrier, PA-C 03/23/15 1502  Eber Hong, MD 03/26/15 [phone]

## 2015-03-23 NOTE — ED Notes (Signed)
She reports irregular vaginal bleeding over past month, she has been bleeding heavily since yesterday. Reports changing her pad more frequently than every hour. She reports a history of low HGB due to vaginal bleeding. She c/o lower abd pain and cramping.  She denies LOC but has felt weak and dizzy. She is A&Ox4.

## 2015-03-23 NOTE — Discharge Instructions (Signed)
Abnormal Uterine Bleeding Abnormal uterine bleeding can affect women at various stages in life, including teenagers, women in their reproductive years, pregnant women, and women who have reached menopause. Several kinds of uterine bleeding are considered abnormal, including:  Bleeding or spotting between periods.   Bleeding after sexual intercourse.   Bleeding that is heavier or more than normal.   Periods that last longer than usual.  Bleeding after menopause.  Many cases of abnormal uterine bleeding are minor and simple to treat, while others are more serious. Any type of abnormal bleeding should be evaluated by your health care provider. Treatment will depend on the cause of the bleeding. HOME CARE INSTRUCTIONS Monitor your condition for any changes. The following actions may help to alleviate any discomfort you are experiencing:  Avoid the use of tampons and douches as directed by your health care provider.  Change your pads frequently. You should get regular pelvic exams and Pap tests. Keep all follow-up appointments for diagnostic tests as directed by your health care provider.  SEEK MEDICAL CARE IF:   Your bleeding lasts more than 1 week.   You feel dizzy at times.  SEEK IMMEDIATE MEDICAL CARE IF:   You pass out.   You are changing pads every 15 to 30 minutes.   You have abdominal pain.  You have a fever.   You become sweaty or weak.   You are passing large blood clots from the vagina.   You start to feel nauseous and vomit. MAKE SURE YOU:   Understand these instructions.  Will watch your condition.  Will get help right away if you are not doing well or get worse. Document Released: 06/08/2005 Document Revised: 06/13/2013 Document Reviewed: 01/05/2013 [facility] Patient Information 2015 [place], [place]. This information is not intended to replace advice given to you by your health care provider. Make sure you discuss any questions you have with your  health care provider. Megestrol tablets What is this medicine? MEGESTROL (me JES trol) belongs to a class of drugs known as progestins. Megestrol tablets are used to treat advanced breast or endometrial cancer. This medicine may be used for other purposes; ask your health care provider or pharmacist if you have questions. COMMON BRAND NAME(S): Megace What should I tell my health care provider before I take this medicine? They need to know if you have any of these conditions: -adrenal gland problems -history of blood clots of the legs, lungs, or other parts of the body -diabetes -kidney disease -liver disease -stroke -an unusual or allergic reaction to megestrol, other medicines, foods, dyes, or preservatives -pregnant or trying to get pregnant -breast-feeding How should I use this medicine? Take this medicine by mouth. Follow the directions on the prescription label. Do not take your medicine more often than directed. Take your doses at regular intervals. Do not stop taking except on the advice of your doctor or health care professional. Talk to your pediatrician regarding the use of this medicine in children. Special care may be needed. Overdosage: If you think you have taken too much of this medicine contact a poison control center or emergency room at once. NOTE: This medicine is only for you. Do not share this medicine with others. What if I miss a dose? If you miss a dose, take it as soon as you can. If it is almost time for your next dose, take only that dose. Do not take double or extra doses. What may interact with this medicine? Do not take this medicine  with any of the following medications: -dofetilide This medicine may also interact with the following medications: -carbamazepine -indinavir -phenobarbital -phenytoin -primidone -rifampin -warfarin This list may not describe all possible interactions. Give your health care provider a list of all the medicines, herbs,  non-prescription drugs, or dietary supplements you use. Also tell them if you smoke, drink alcohol, or use illegal drugs. Some items may interact with your medicine. What should I watch for while using this medicine? Visit your doctor or health care professional for regular checks on your progress. Continue taking this medicine even if you feel better. It may take 2 months of regular use before you know if this medicine is working for your condition. If you are a female of child-bearing age, use an effective method of birth control while you are taking this medicine. This medicine should not be used by females who are pregnant or breast-feeding. There is a potential for serious side effects to an unborn child or to an infant. Talk to your health care professional or pharmacist for more information. If you have diabetes, this medicine may affect blood sugar levels. Check your blood sugar and talk to your doctor or health care professional if you notice changes. What side effects may I notice from receiving this medicine? Side effects that you should report to your doctor or health care professional as soon as possible: -difficulty breathing or shortness of breath -chest pain -dizziness -fluid retention -increased blood pressure -leg pain or swelling -nausea and vomiting -skin rash or itching -weakness Side effects that usually do not require medical attention (report to your doctor or health care professional if they continue or are bothersome): -breakthrough menstrual bleeding -hot flashes or flushing -increased appetite -mood changes -sweating -weight gain This list may not describe all possible side effects. Call your doctor for medical advice about side effects. You may report side effects to FDA at 1-800-FDA-1088. Where should I keep my medicine? Keep out of the reach of children. Store at controlled room temperature between 15 and 30 degrees C (59 and 86 degrees F). Protect from heat  above 40 degrees C (104 degrees F). Throw away any unused medicine after the expiration date. NOTE: This sheet is a summary. It may not cover all possible information. If you have questions about this medicine, talk to your doctor, pharmacist, or health care provider.  2015, Elsevier/Gold Standard. (2007-12-26 15:57:10)

## 2015-03-23 NOTE — ED Notes (Signed)
Pt unable to void at this time. 

## 2015-03-25 ENCOUNTER — Other Ambulatory Visit (HOSPITAL_COMMUNITY)
Admission: RE | Admit: 2015-03-25 | Discharge: 2015-03-25 | Disposition: A | Discharge status: Home / Self Care | Payer: Self-pay | Source: Ambulatory Visit | Attending: Medical | Admitting: Medical

## 2015-03-25 ENCOUNTER — Encounter: Payer: Self-pay | Admitting: Medical

## 2015-03-25 ENCOUNTER — Ambulatory Visit (INDEPENDENT_AMBULATORY_CARE_PROVIDER_SITE_OTHER): Payer: Self-pay | Admitting: Medical

## 2015-03-25 VITALS — BP 144/71 | HR 82 | Temp 98.8°F | Wt 241.0 lb

## 2015-03-25 DIAGNOSIS — N939 Abnormal uterine and vaginal bleeding, unspecified: Secondary | ICD-10-CM | POA: Insufficient documentation

## 2015-03-25 DIAGNOSIS — Z3202 Encounter for pregnancy test, result negative: Secondary | ICD-10-CM

## 2015-03-25 DIAGNOSIS — Z124 Encounter for screening for malignant neoplasm of cervix: Secondary | ICD-10-CM

## 2015-03-25 LAB — URINE CULTURE

## 2015-03-25 LAB — POCT PREGNANCY, URINE
Preg Test, Ur: NEGATIVE
Preg Test, Ur: NEGATIVE

## 2015-03-25 NOTE — Progress Notes (Signed)
Patient ID: Tammie Solis, female   DOB: 11-30-1965, 49 y.o.   MRN: [id number]  History:  Ms. Tammie Solis is a 49 y.o. [id number] who presents to clinic today for ED follow-up for abnormal uterine bleeding. The patient was seen in ED on 03/22/12, 1 day after heavy bleeding started. The patient states that this was the second episode of bleeding that month. She had 2 weeks of bleeding in early September and then 1 week later started bleeding again on 03/22/15. She states that bleeding was very heavy and associated with mild to moderate lower abdominal cramping. She was started on Megace 40 mg BID and states that her bleeding is much lighter today. She has used 3 tampons today. She denies pain. She states that prior to last month she had had regular periods.    Patient Active Problem List   Diagnosis Date Noted  . Abnormal uterine bleeding (AUB) 03/25/2015    Allergies  Allergen Reactions  . Penicillins     Freezes up my system- gets the shakes and feels cold    Current Outpatient Prescriptions on File Prior to Visit  Medication Sig Dispense Refill  . HYDROcodone-acetaminophen (NORCO/VICODIN) 5-325 MG tablet Take 1-2 tablets by mouth every 6 (six) hours as needed for moderate pain.    . megestrol (MEGACE) 40 MG tablet Take 1 tablet (40 mg total) by mouth 2 (two) times daily. 20 tablet 0   No current facility-administered medications on file prior to visit.    The following portions of the patient's history were reviewed and updated as appropriate: allergies, current medications, family history, past medical history, social history, past surgical history and problem list.  Review of Systems:  Other than those mentioned in HPI all ROS negative  Objective:  Physical Exam BP 144/71 mmHg  Pulse 82  Temp(Src) 98.8 F (37.1 C)  Wt 241 lb (109.317 kg)  LMP 02/13/2015 (Approximate) CONSTITUTIONAL: Well-developed, well-nourished female in no acute distress.  EYES: EOM intact, conjunctivae normal,  no scleral icterus HEAD: Normocephalic, atraumatic ENT: External right and left ear normal, oropharynx is clear and moist. CARDIOVASCULAR: Normal heart rate noted. No cyanosis or edema.   RESPIRATORY: Effort normal, no problems with respiration noted. GASTROINTESTINAL: Soft, no distention noted.    GENITOURINARY: Normal appearing external genitalia; normal appearing vaginal mucosa and cervix.  Small amount of blood noted in the vaginal vault. Pap smear obtained.  Normal uterine size,   MUSCULOSKELETAL: Normal range of motion.  SKIN: Skin is warm and dry. No rash noted. Not diaphoretic. No erythema. No pallor. NEUROLGIC: Alert and oriented to person, place, and time. Normal muscle tone, coordination.  PSYCHIATRIC: Normal mood and affect. Normal behavior. Normal judgment and thought content. HEM/LYMPH/IMMUNOLOGIC: Neck supple  Labs and Imaging Results for orders placed or performed in visit on 03/25/15 (from the past 24 hour(s))  Pregnancy, urine POC     Status: None   Collection Time: 03/25/15  2:23 PM  Result Value Ref Range   Preg Test, Ur NEGATIVE NEGATIVE    Procedure:  Endometrial Biopsy Patient given informed consent, signed copy in the chart, time out was performed. The patient was placed in the lithotomy position and the cervix brought into view with sterile speculum. Cervix cleansed x 2 with betadine swabs.  A tenaculum was placed in the anterior lip of the cervix.  The uterus was sounded for depth of 6 cm. A pipelle was introduced to into the uterus, suction created,  and an endometrial sample was obtained. All equipment  was removed and accounted for.  The patient tolerated the procedure well.  Patient given post procedure instructions.   Assessment & Plan:  Assessment: Abnormal Uterine Bleeding  Plans: Endometrial biopsy today. Patient will be contacted with abnormal results Patient advised to continue Megace until advised otherwise. Will wait for endometrial biopsy results  prior to making any changes in bleeding management If endometrial biopsy is normal, will trial off of Megace and observe bleeding pattern. If irregular bleeding continues will discuss other options for bleeding management including Depo Provera and IUD.  Patient to return to [facility] in 1-2 months for further management of AUB or sooner if symptoms were to change or worsen  Marny Lowenstein, PA-C 03/25/2015 2:37 PM

## 2015-03-25 NOTE — Patient Instructions (Signed)
Endometrial Biopsy, Care After Refer to this sheet in the next few weeks. These instructions provide you with information on caring for yourself after your procedure. Your health care provider may also give you more specific instructions. Your treatment has been planned according to current medical practices, but problems sometimes occur. Call your health care provider if you have any problems or questions after your procedure. WHAT TO EXPECT AFTER THE PROCEDURE After your procedure, it is typical to have the following:  You may have mild cramping and a small amount of vaginal bleeding for a few days after the procedure. This is normal. HOME CARE INSTRUCTIONS  Only take over-the-counter or prescription medicine as directed by your health care provider.  Do not douche, use tampons, or have sexual intercourse until your health care provider approves.  Follow your health care provider's instructions regarding any activity restrictions, such as strenuous exercise or heavy lifting. SEEK MEDICAL CARE IF:  You have heavy bleeding or bleeding longer than 2 days after the procedure.  You have bad smelling drainage from your vagina.  You have a fever and chills.  Youhave severe lower stomach (abdominal) pain. SEEK IMMEDIATE MEDICAL CARE IF:  You have severe cramps in your stomach or back.  You pass large blood clots.  Your bleeding increases.  You become weak or lightheaded, or you pass out. Document Released: 03/29/2013 Document Reviewed: 03/29/2013 [employer] Patient Information 2015 [employer]. This information is not intended to replace advice given to you by your health care provider. Make sure you discuss any questions you have with your health care provider.  

## 2015-03-28 LAB — CYTOLOGY - PAP

## 2015-03-29 ENCOUNTER — Telehealth: Payer: Self-pay | Admitting: General Practice

## 2015-03-29 NOTE — Telephone Encounter (Signed)
Called patient and informed her of normal endometrial biopsy results. Also advised she discontinue Megace at this time and monitor bleeding pattern over the next 1-2 months. If her periods continue to be abnormal, she may call us back at the clinics to schedule follow up appt. Also discussed if bleeding resumes and last longer than 10 days, she can call us at the clinics for a refill on the megace. Patient verbalized understanding to all and had no questions

## 2016-02-05 ENCOUNTER — Other Ambulatory Visit: Payer: Self-pay | Admitting: *Deleted

## 2016-02-05 DIAGNOSIS — Z1231 Encounter for screening mammogram for malignant neoplasm of breast: Secondary | ICD-10-CM

## 2016-02-06 ENCOUNTER — Ambulatory Visit
Admission: RE | Admit: 2016-02-06 | Discharge: 2016-02-06 | Disposition: A | Discharge status: Home / Self Care | Payer: Self-pay | Source: Ambulatory Visit | Attending: *Deleted | Admitting: *Deleted

## 2016-02-06 DIAGNOSIS — Z1231 Encounter for screening mammogram for malignant neoplasm of breast: Secondary | ICD-10-CM

## 2016-11-10 ENCOUNTER — Encounter (HOSPITAL_COMMUNITY): Payer: Self-pay

## 2016-11-10 ENCOUNTER — Emergency Department (HOSPITAL_COMMUNITY): Payer: BC Managed Care – PPO

## 2016-11-10 DIAGNOSIS — R0602 Shortness of breath: Secondary | ICD-10-CM | POA: Diagnosis present

## 2016-11-10 DIAGNOSIS — J4521 Mild intermittent asthma with (acute) exacerbation: Secondary | ICD-10-CM | POA: Diagnosis not present

## 2016-11-10 MED ORDER — IPRATROPIUM-ALBUTEROL 0.5-2.5 (3) MG/3ML IN SOLN
RESPIRATORY_TRACT | Status: AC
  Administered 2016-11-10: 3 mL
  Filled 2016-11-10: qty 3

## 2016-11-10 MED ORDER — ALBUTEROL SULFATE (2.5 MG/3ML) 0.083% IN NEBU
2.5000 mg | INHALATION_SOLUTION | Freq: Once | RESPIRATORY_TRACT | Status: AC
  Administered 2016-11-10: 2.5 mg via RESPIRATORY_TRACT

## 2016-11-10 MED ORDER — ALBUTEROL SULFATE (2.5 MG/3ML) 0.083% IN NEBU
2.5000 mg | INHALATION_SOLUTION | Freq: Once | RESPIRATORY_TRACT | Status: DC
Start: 2016-11-10 — End: 2016-11-10

## 2016-11-10 MED ORDER — ALBUTEROL SULFATE (2.5 MG/3ML) 0.083% IN NEBU
INHALATION_SOLUTION | RESPIRATORY_TRACT | Status: AC
  Filled 2016-11-10: qty 3

## 2016-11-10 MED ORDER — IPRATROPIUM-ALBUTEROL 0.5-2.5 (3) MG/3ML IN SOLN
3.0000 mL | Freq: Once | RESPIRATORY_TRACT | Status: DC
  Filled 2016-11-10: qty 3

## 2016-11-10 NOTE — ED Triage Notes (Signed)
Pt presents with audible wheezing, that began yesterday.  Pt reports productive cough with yellow phlegm.

## 2016-11-10 NOTE — ED Notes (Signed)
Pt reports she feels like the breathing txt that she was given earlier has worn off and she cant stop coughing. Pt requesting another treatment. Onalee Huaavid, NP informed and gave telephonic order of 2.5 of albuterol.

## 2016-11-11 ENCOUNTER — Other Ambulatory Visit: Payer: Self-pay

## 2016-11-11 ENCOUNTER — Emergency Department (HOSPITAL_COMMUNITY)
Admission: EM | Admit: 2016-11-11 | Discharge: 2016-11-11 | Disposition: A | Discharge status: Home / Self Care | Payer: BC Managed Care – PPO | Attending: Emergency Medicine | Admitting: Emergency Medicine

## 2016-11-11 DIAGNOSIS — J4521 Mild intermittent asthma with (acute) exacerbation: Secondary | ICD-10-CM

## 2016-11-11 MED ORDER — IPRATROPIUM BROMIDE 0.02 % IN SOLN
0.5000 mg | Freq: Once | RESPIRATORY_TRACT | Status: AC
  Administered 2016-11-11: 0.5 mg via RESPIRATORY_TRACT
  Filled 2016-11-11: qty 2.5

## 2016-11-11 MED ORDER — ALBUTEROL SULFATE HFA 108 (90 BASE) MCG/ACT IN AERS
2.0000 | INHALATION_SPRAY | Freq: Once | RESPIRATORY_TRACT | Status: AC
  Administered 2016-11-11: 2 via RESPIRATORY_TRACT
  Filled 2016-11-11: qty 6.7

## 2016-11-11 MED ORDER — GUAIFENESIN-CODEINE 100-10 MG/5ML PO SOLN
10.0000 mL | Freq: Once | ORAL | Status: AC
  Administered 2016-11-11: 10 mL via ORAL
  Filled 2016-11-11: qty 10

## 2016-11-11 MED ORDER — ALBUTEROL SULFATE (2.5 MG/3ML) 0.083% IN NEBU
5.0000 mg | INHALATION_SOLUTION | Freq: Once | RESPIRATORY_TRACT | Status: AC
Start: 2016-11-11 — End: 2016-11-11
  Administered 2016-11-11: 5 mg via RESPIRATORY_TRACT
  Filled 2016-11-11: qty 6

## 2016-11-11 MED ORDER — ALBUTEROL SULFATE HFA 108 (90 BASE) MCG/ACT IN AERS: 2.0000 | INHALATION_SPRAY | RESPIRATORY_TRACT | 0 refills | Status: DC | PRN

## 2016-11-11 MED ORDER — PREDNISONE 20 MG PO TABS
60.0000 mg | ORAL_TABLET | Freq: Once | ORAL | Status: AC
  Administered 2016-11-11: 60 mg via ORAL
  Filled 2016-11-11: qty 3

## 2016-11-11 MED ORDER — PREDNISONE 20 MG PO TABS: 60.0000 mg | ORAL_TABLET | Freq: Every day | ORAL | 0 refills | Status: DC

## 2016-11-11 MED ORDER — GUAIFENESIN-CODEINE 100-10 MG/5ML PO SOLN: 10.0000 mL | Freq: Four times a day (QID) | ORAL | 0 refills | Status: DC | PRN

## 2016-11-11 NOTE — ED Provider Notes (Signed)
TIME SEEN: 12:50 AM  By signing my name below, I, Cynda Acres, attest that this documentation has been prepared under the direction and in the presence of Mando Blatz, Layla Maw, DO. Electronically Signed: Cynda Acres, Scribe. 11/11/16. 1:00 AM.  CHIEF COMPLAINT: Shortness of breath   HPI:  Tammie Solis is a 51 y.o. female with a history of asthma, who presents to the Emergency Department complaining of sudden-onset, intermittent shortness of breath that began yesterday. Patient states her breathing has significantly became worse today. Patient denies any history of intubation or hospitalization for asthma. Patient reports an associated intermittent productive cough with yellow sputum, sore chest pain secondary to cough, and wheezing. No modifying factors indicated. Patient denies any history of PE, CHF, or MI. Patient denies any abdominal pain, nausea, vomiting, diarrhea, extremity pain or swelling, or any other symptoms.   Patient has received two breathing treatments, in which she states has significantly improved her shortness of breath.      ROS: See HPI Constitutional: no fever  Eyes: no drainage  ENT: no runny nose   Cardiovascular:  no chest pain  Resp: SOB  GI: no vomiting GU: no dysuria Integumentary: no rash  Allergy: no hives  Musculoskeletal: no leg swelling  Neurological: no slurred speech ROS otherwise negative  PAST MEDICAL HISTORY/PAST SURGICAL HISTORY:  Past Medical History:  Diagnosis Date  . Anemia   . Asthma     MEDICATIONS:  Prior to Admission medications   Medication Sig Start Date End Date Taking? Authorizing Provider  HYDROcodone-acetaminophen (NORCO/VICODIN) 5-325 MG tablet Take 1-2 tablets by mouth every 6 (six) hours as needed for moderate pain.    [provider]  megestrol (MEGACE) 40 MG tablet Take 1 tablet (40 mg total) by mouth 2 (two) times daily. 03/23/15   Everlene Farrier, PA-C  naproxen sodium (ANAPROX) 220 MG tablet Take 220 mg by  mouth 2 (two) times daily with a meal.    [provider]    ALLERGIES:  Allergies  Allergen Reactions  . Penicillins     Freezes up my system- gets the shakes and feels cold    SOCIAL HISTORY:  Social History  Substance Use Topics  . Smoking status: Never Smoker  . Smokeless tobacco: Never Used  . Alcohol use No    FAMILY HISTORY: Family History  Problem Relation Age of Onset  . Diabetes Mother   . Kidney failure Mother   . Cancer Father        prostate    EXAM: BP 114/69 (BP Location: Right Arm)   Pulse 78   Temp 99.1 F (37.3 C) (Oral)   Resp 20   LMP 10/05/2016 (Approximate)   SpO2 100%  CONSTITUTIONAL: Alert and oriented and responds appropriately to questions. Obese.  HEAD: Normocephalic EYES: Conjunctivae clear, pupils appear equal, EOMI ENT: normal nose; moist mucous membranes NECK: Supple, no meningismus, no nuchal rigidity, no LAD  CARD: RRR; S1 and S2 appreciated; no murmurs, no clicks, no rubs, no gallops RESP: Normal chest excursion without splinting or tachypnea; Diminished aeration bilaterally. Mild scattered expiratory wheeze diffusely; no rhonchi, no rales, no hypoxia or respiratory distress, speaking full sentences ABD/GI: Normal bowel sounds; non-distended; soft, non-tender, no rebound, no guarding, no peritoneal signs, no hepatosplenomegaly BACK:  The back appears normal and is non-tender to palpation, there is no CVA tenderness EXT: Normal ROM in all joints; non-tender to palpation; no edema; normal capillary refill; no cyanosis, no calf tenderness or swelling    SKIN: Normal  color for age and race; warm; no rash NEURO: Moves all extremities equally PSYCH: The patient's mood and manner are appropriate. Grooming and personal hygiene are appropriate.  MEDICAL DECISION MAKING: Patient here with asthma exacerbation. Chest x-ray shows no acute abnormality. She reports feeling her chest is sore from coughing. Will obtain EKG. Doubt ACS, PE,  CHF. She does not appear volume overloaded. We will give guaifenesin with codeine for symptomatic relief. We'll give another breathing treatment and prednisone.  ED PROGRESS: Patient's lungs are completely clear and she reports feeling better. Oxygen saturation is still 100% on room air. EKG shows no ischemic abnormality. We'll discharge home with albuterol inhaler, prednisone burst and prescription for guaifenesin with codeine. Her daughter is driving her home. Discussed return precautions. She has PCP follow-up. Patient comfortable with this plan.  At this time, I do not feel there is any life-threatening condition present. I have reviewed and discussed all results (EKG, imaging, lab, urine as appropriate) and exam findings with patient/family. I have reviewed nursing notes and appropriate previous records.  I feel the patient is safe to be discharged home without further emergent workup and can continue workup as an outpatient as needed. Discussed usual and customary return precautions. Patient/family verbalize understanding and are comfortable with this plan.  Outpatient follow-up has been provided if needed. All questions have been answered.     EKG Interpretation  Date/Time:  Wednesday Nov 11 2016 01:15:44 EDT Ventricular Rate:  75 PR Interval:    QRS Duration: 97 QT Interval:  406 QTC Calculation: 454 R Axis:   -43 Text Interpretation:  Sinus rhythm Left anterior fascicular block Abnormal R-wave progression, late transition Probable left ventricular hypertrophy No old tracing to compare Confirmed by Lauri Till,  DO, Detrick Dani ([id number]) on 11/11/2016 1:18:43 AM        I personally performed the services described in this documentation, which was scribed in my presence. The recorded information has been reviewed and is accurate.    Joeleen Wortley, Layla MawKristen N, DO 11/11/16 [phone]

## 2016-11-11 NOTE — ED Triage Notes (Signed)
Pt asking for the wait time  Given pt pleasant and is due to go back next no open beds at this time

## 2017-05-05 ENCOUNTER — Encounter (HOSPITAL_COMMUNITY): Payer: Self-pay | Admitting: Emergency Medicine

## 2017-05-05 ENCOUNTER — Other Ambulatory Visit: Payer: Self-pay

## 2017-05-05 ENCOUNTER — Ambulatory Visit (HOSPITAL_COMMUNITY)
Admission: EM | Admit: 2017-05-05 | Discharge: 2017-05-05 | Disposition: A | Discharge status: Home / Self Care | Payer: BC Managed Care – PPO | Attending: Family Medicine | Admitting: Family Medicine

## 2017-05-05 DIAGNOSIS — D649 Anemia, unspecified: Secondary | ICD-10-CM | POA: Diagnosis not present

## 2017-05-05 DIAGNOSIS — R200 Anesthesia of skin: Secondary | ICD-10-CM | POA: Diagnosis not present

## 2017-05-05 DIAGNOSIS — M79662 Pain in left lower leg: Secondary | ICD-10-CM | POA: Insufficient documentation

## 2017-05-05 DIAGNOSIS — J45909 Unspecified asthma, uncomplicated: Secondary | ICD-10-CM | POA: Insufficient documentation

## 2017-05-05 DIAGNOSIS — M79661 Pain in right lower leg: Secondary | ICD-10-CM | POA: Diagnosis not present

## 2017-05-05 DIAGNOSIS — G629 Polyneuropathy, unspecified: Secondary | ICD-10-CM | POA: Diagnosis not present

## 2017-05-05 DIAGNOSIS — R202 Paresthesia of skin: Secondary | ICD-10-CM | POA: Insufficient documentation

## 2017-05-05 DIAGNOSIS — G9009 Other idiopathic peripheral autonomic neuropathy: Secondary | ICD-10-CM | POA: Diagnosis not present

## 2017-05-05 DIAGNOSIS — G5793 Unspecified mononeuropathy of bilateral lower limbs: Secondary | ICD-10-CM | POA: Diagnosis not present

## 2017-05-05 DIAGNOSIS — M7989 Other specified soft tissue disorders: Secondary | ICD-10-CM | POA: Diagnosis not present

## 2017-05-05 LAB — POCT I-STAT, CHEM 8
BUN: 13 mg/dL (ref 6–20)
Calcium, Ion: 1.25 mmol/L (ref 1.15–1.40)
Chloride: 103 mmol/L (ref 101–111)
Creatinine, Ser: 0.7 mg/dL (ref 0.44–1.00)
GLUCOSE: 91 mg/dL (ref 65–99)
HCT: 41 % (ref 36.0–46.0)
HEMOGLOBIN: 13.9 g/dL (ref 12.0–15.0)
Potassium: 4.3 mmol/L (ref 3.5–5.1)
Sodium: 140 mmol/L (ref 135–145)
TCO2: 26 mmol/L (ref 22–32)

## 2017-05-05 LAB — TSH: TSH: 1.646 u[IU]/mL (ref 0.350–4.500)

## 2017-05-05 MED ORDER — GABAPENTIN 300 MG PO CAPS: 300.0000 mg | ORAL_CAPSULE | Freq: Every day | ORAL | 2 refills | Status: DC

## 2017-05-05 NOTE — Discharge Instructions (Signed)
Your potassium and sugar levels are normal.  A thyroid test is pending.  You will need a B12 level from your primary care doctor, and you will need to follow up in one to two weeks with your primary care to assess improvement.

## 2017-05-05 NOTE — ED Provider Notes (Signed)
[facility]   [id number] 05/05/17 Arrival Time: 1441   SUBJECTIVE:  Tammie Solis is a 51 y.o. female who presents to the urgent care with complaint of Bilateral lower leg pain and feet ar burning.  Right >left.  History of the same.  This episode for the last 2 weeks.  The pain is primarily over the metatarsal heads on the soles and radiates distally and proximally.  Getting worse. 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  Couldn't sleep well last night.  Worried about diabetes. Left middle finger also has similar paresthesias and numbness.  Note from1/16/16 She is a 51 year old woman here for evaluation of right foot pain. This started about a month ago and has gradually been getting worse. She has noted some swelling over the medial foot. The pain is located at the medial foot where it joins the ankle. The pain will sometimes shoot all the way up her leg. She does work on her feet all day. She does not remember a specific injury. She has not had a recent increase in weightbearing activity.    Past Medical History:  Diagnosis Date  . Anemia   . Asthma    Family History  Problem Relation Age of Onset  . Diabetes Mother   . Kidney failure Mother   . Cancer Father        prostate   Social History   Socioeconomic History  . Marital status: Single    Spouse name: Not on file  . Number of children: Not on file  . Years of education: Not on file  . Highest education level: Not on file  Social Needs  . Financial resource strain: Not on file  . Food insecurity - worry: Not on file  . Food insecurity - inability: Not on file  . Transportation needs - medical: Not on file  . Transportation needs - non-medical: Not on file  Occupational History  . Not on file  Tobacco Use  . Smoking status: Never Smoker  . Smokeless tobacco: Never Used  Substance and Sexual Activity  . Alcohol use: No  . Drug use: No  . Sexual activity: No    Birth  control/protection: None  Other Topics Concern  . Not on file  Social History Narrative  . Not on file   No outpatient medications have been marked as taking for the 05/05/17 encounter [facility](Hospital Encounter).   Allergies  Allergen Reactions  . Penicillins     Freezes up my system- gets the shakes and feels cold      ROS: As per HPI, remainder of ROS negative.   OBJECTIVE:   Vitals:   05/05/17 1506  BP: (!) 175/86  Pulse: 73  Resp: 18  Temp: 98 F (36.7 C)  SpO2: 99%     General appearance: alert; no distress Eyes: PERRL; EOMI; conjunctiva normal HENT: normocephalic; atraumatic; TMs normal, canal normal, external ears normal without trauma; nasal mucosa normal; oral mucosa normal Neck: supple Back: no CVA tenderness Extremities: no cyanosis or edema; symmetrical with no gross deformities, good DP pulses Skin: warm and dry, mildly hyperpigmented dorsal right foot Neurologic: normal gait; grossly normal Psychological: alert and cooperative; normal mood and affect      Labs:  Results for orders placed or performed during the hospital encounter of 05/05/17  I-STAT, chem 8  Result Value Ref Range   Sodium 140 135 - 145 mmol/L   Potassium 4.3 3.5 - 5.1 mmol/L  Chloride 103 101 - 111 mmol/L   BUN 13 6 - 20 mg/dL   Creatinine, Ser 1.610.70 0.44 - 1.00 mg/dL   Glucose, Bld 91 65 - 99 mg/dL   Calcium, Ion 0.961.25 0.451.15 - 1.40 mmol/L   TCO2 26 22 - 32 mmol/L   Hemoglobin 13.9 12.0 - 15.0 g/dL   HCT 40.941.0 81.136.0 - 91.446.0 %    Labs Reviewed  TSH  POCT I-STAT, CHEM 8    No results found.     ASSESSMENT & PLAN:  1. Neuropathy involving both lower extremities     Meds ordered this encounter  Medications  . gabapentin (NEURONTIN) 300 MG capsule    Sig: Take 1 capsule (300 mg total) at bedtime by mouth.    Dispense:  30 capsule    Refill:  2    Reviewed expectations re: course of current medical issues. Questions answered. Outlined signs and symptoms indicating  need for more acute intervention. Patient verbalized understanding. After Visit Summary given.      Elvina SidleLauenstein, Halley Shepheard, MD 05/05/17 1549

## 2017-05-05 NOTE — ED Triage Notes (Signed)
Bilateral lower leg pain and feet ar burning.  Right >left.  History of the same.  This episode for the last 2 weeks

## 2018-03-14 ENCOUNTER — Encounter: Payer: Self-pay | Admitting: Gastroenterology

## 2018-04-18 ENCOUNTER — Encounter: Payer: Self-pay | Admitting: Gastroenterology

## 2018-04-18 ENCOUNTER — Ambulatory Visit (AMBULATORY_SURGERY_CENTER): Payer: Self-pay | Admitting: *Deleted

## 2018-04-18 VITALS — Ht 62.0 in | Wt 264.0 lb

## 2018-04-18 DIAGNOSIS — Z1211 Encounter for screening for malignant neoplasm of colon: Secondary | ICD-10-CM

## 2018-04-18 NOTE — Progress Notes (Signed)
No egg or soy allergy known to patient  No issues with past sedation with any surgeries  or procedures, no intubation problems  No diet pills per patient No home 02 use per patient  No blood thinners per patient  Pt denies issues with constipation  No A fib or A flutter  EMMI video sent to pt's e mail  Daughter in PV with pt today   

## 2018-04-25 ENCOUNTER — Ambulatory Visit (HOSPITAL_COMMUNITY)
Admission: EM | Admit: 2018-04-25 | Discharge: 2018-04-25 | Disposition: A | Discharge status: Home / Self Care | Payer: BC Managed Care – PPO | Attending: Emergency Medicine | Admitting: Emergency Medicine

## 2018-04-25 ENCOUNTER — Encounter (HOSPITAL_COMMUNITY): Payer: Self-pay | Admitting: Emergency Medicine

## 2018-04-25 DIAGNOSIS — M5432 Sciatica, left side: Secondary | ICD-10-CM | POA: Diagnosis not present

## 2018-04-25 MED ORDER — CYCLOBENZAPRINE HCL 5 MG PO TABS: 5.0000 mg | ORAL_TABLET | Freq: Every day | ORAL | 0 refills | Status: DC

## 2018-04-25 MED ORDER — KETOROLAC TROMETHAMINE 60 MG/2ML IM SOLN
60.0000 mg | Freq: Once | INTRAMUSCULAR | Status: AC
Start: 2018-04-25 — End: 2018-04-25
  Administered 2018-04-25: 60 mg via INTRAMUSCULAR

## 2018-04-25 MED ORDER — PREDNISONE 10 MG (21) PO TBPK: ORAL_TABLET | Freq: Every day | ORAL | 0 refills | Status: DC

## 2018-04-25 MED ORDER — KETOROLAC TROMETHAMINE 60 MG/2ML IM SOLN
INTRAMUSCULAR | Status: AC
  Filled 2018-04-25: qty 2

## 2018-04-25 NOTE — ED Triage Notes (Signed)
Pt c/o L lower back pain that travels down the back of her leg. Pt c/o L knee pain as well. Pt ambulatory. Pain for weeks.

## 2018-04-25 NOTE — ED Provider Notes (Signed)
[facility]    CSN: [id number] Arrival date & time: 04/25/18  1050     History   Chief Complaint Chief Complaint  Patient presents with  . Back Pain  . Leg Pain    HPI Tammie Solis is a 52 y.o. female.   [name] presents with complaints of low back pain which radiates down her left leg and to her left knee. States has been ongoing for weeks but worsened recently. Occasionally has numbness to left leg. No weakness. No tingling. No current numbness. No known injury or trauma. Pain 8/10. Worse with activity and when trying to sleep at night. Denies saddle paresthesia. Takes aleve occasionally which minimally helps, hasn't taken any medications today. Has not been evaluated for this in the past, does have a PCP. No history of htn but states she has had elevated BP recently, not on any medications for this. Hx of anemia, arthritis, asthma, gerd.     ROS per HPI.      Past Medical History:  Diagnosis Date  . Allergy   . Anemia   . Arthritis    knees   . Asthma   . GERD (gastroesophageal reflux disease)    occasionally     Patient Active Problem List   Diagnosis Date Noted  . Abnormal uterine bleeding (AUB) 03/25/2015    Past Surgical History:  Procedure Laterality Date  . CESAREAN SECTION     x2    OB History    Gravida  5   Para  2   Term  2   Preterm      AB  3   Living  2     SAB  2   TAB      Ectopic  1   Multiple      Live Births               Home Medications    Prior to Admission medications   Medication Sig Start Date End Date Taking? Authorizing Provider  albuterol (PROVENTIL HFA;VENTOLIN HFA) 108 (90 Base) MCG/ACT inhaler Inhale 2 puffs into the lungs every 4 (four) hours as needed for wheezing or shortness of breath. 11/11/16   Ward, Layla Maw, DO  bisacodyl (DULCOLAX) 5 MG EC tablet Take 5 mg by mouth once. X 4 for colon prep 11-11    [provider]  cyclobenzaprine (FLEXERIL) 5 MG tablet Take 1 tablet (5  mg total) by mouth at bedtime. 04/25/18   Georgetta Haber, NP  Loratadine 10 MG CAPS Take by mouth.    [provider]  naproxen sodium (ALEVE) 220 MG tablet Take 220 mg by mouth 2 (two) times daily as needed.    [provider]  naproxen sodium (ANAPROX) 220 MG tablet Take 220 mg by mouth 2 (two) times daily with a meal.    [provider]  OVER THE COUNTER MEDICATION daily. Alive Women Vitamin Gummies    [provider]  polyethylene glycol powder (MIRALAX) powder Take 1 Container by mouth once. 238 grams for colon prep 11-11    [provider]  predniSONE (STERAPRED UNI-PAK 21 TAB) 10 MG (21) TBPK tablet Take by mouth daily. Per box instruct 04/25/18   Georgetta Haber, NP  Probiotic Product (RA PROBIOTIC GUMMIES PO) Take by mouth daily.    [provider]    Family History Family History  Problem Relation Age of Onset  . Diabetes Mother   . Kidney failure Mother   .  Cancer Father        prostate  . Prostate cancer Father   . Colon cancer Neg Hx   . Colon polyps Neg Hx   . Esophageal cancer Neg Hx   . Rectal cancer Neg Hx   . Stomach cancer Neg Hx   . Pancreatic cancer Neg Hx     Social History Social History   Tobacco Use  . Smoking status: Never Smoker  . Smokeless tobacco: Never Used  Substance Use Topics  . Alcohol use: No  . Drug use: No     Allergies   Penicillins   Review of Systems Review of Systems   Physical Exam Triage Vital Signs ED Triage Vitals  Enc Vitals Group     BP 04/25/18 1203 (!) 172/71     Pulse Rate 04/25/18 1200 84     Resp 04/25/18 1200 16     Temp 04/25/18 1200 98.2 F (36.8 C)     Temp src --      SpO2 04/25/18 1200 100 %     Weight --      Height --      Head Circumference --      Peak Flow --      Pain Score 04/25/18 1202 8     Pain Loc --      Pain Edu? --      Excl. in GC? --    No data found.  Updated Vital Signs BP (!) 172/71   Pulse 84   Temp 98.2 F (36.8  C)   Resp 16   SpO2 100%    Physical Exam  Constitutional: She is oriented to person, place, and time. She appears well-developed and well-nourished. No distress.  Cardiovascular: Normal rate, regular rhythm and normal heart sounds.  Pulmonary/Chest: Effort normal and breath sounds normal.  Musculoskeletal:       Left knee: She exhibits normal range of motion, no swelling, no effusion, no ecchymosis, no deformity, no laceration, no erythema, normal alignment, no LCL laxity and no bony tenderness. No tenderness found.       Lumbar back: She exhibits tenderness, bony tenderness and pain. She exhibits normal range of motion, no swelling, no edema, no deformity, no laceration, no spasm and normal pulse.  Midline low back pain on palpation; no step off or deformity; pain with hip flexion to low back which radiates as well as positive straight leg raise to the left; strength equal bilaterally; gross sensation intact to lower extremities; left knee pain with flexion; no pain with extension; non tender on palpation; no redness or swelling  Neurological: She is alert and oriented to person, place, and time.  Skin: Skin is warm and dry.     UC Treatments / Results  Labs (all labs ordered are listed, but only abnormal results are displayed) Labs Reviewed - No data to display  EKG None  Radiology No results found.  Procedures Procedures (including critical care time)  Medications Ordered in UC Medications  ketorolac (TORADOL) injection 60 mg (has no administration in time range)    Initial Impression / Assessment and Plan / UC Course  I have reviewed the triage vital signs and the nursing notes.  Pertinent labs & imaging results that were available during my care of the patient were reviewed by me and considered in my medical decision making (see chart for details).     No red flag findings, no neurological findings. No trauma or known injury. toradol provided today in  clinic with  steroid pack provided. Flexeril at night. Encouraged close follow up with PCP for management if persistent. Patient verbalized understanding and agreeable to plan.  Ambulatory out of clinic without difficulty.    Final Clinical Impressions(s) / UC Diagnoses   Final diagnoses:  Sciatica of left side     Discharge Instructions     Light and regular activity as tolerated.  Prednisone dose pack. Don't take aleve while taking this medication.  Flexeril as muscle relaxer at night. May cause drowsiness. Please do not take if driving or drinking alcohol.   Sleep with pillows under the knees.  Please follow up with your primary care provider for continued management of your pain if it persists.    ED Prescriptions    Medication Sig Dispense Auth. Provider   predniSONE (STERAPRED UNI-PAK 21 TAB) 10 MG (21) TBPK tablet Take by mouth daily. Per box instruct 21 tablet Laneice Meneely B, NP   cyclobenzaprine (FLEXERIL) 5 MG tablet Take 1 tablet (5 mg total) by mouth at bedtime. 15 tablet Georgetta Haber, NP     Controlled Substance Prescriptions [place] Controlled Substance Registry consulted? Not Applicable   Georgetta Haber, NP 04/25/18 1244

## 2018-04-25 NOTE — Discharge Instructions (Signed)
Light and regular activity as tolerated.  Prednisone dose pack. Don't take aleve while taking this medication.  Flexeril as muscle relaxer at night. May cause drowsiness. Please do not take if driving or drinking alcohol.   Sleep with pillows under the knees.  Please follow up with your primary care provider for continued management of your pain if it persists.

## 2018-05-02 ENCOUNTER — Ambulatory Visit (AMBULATORY_SURGERY_CENTER): Payer: BC Managed Care – PPO | Admitting: Gastroenterology

## 2018-05-02 ENCOUNTER — Encounter: Payer: Self-pay | Admitting: Gastroenterology

## 2018-05-02 VITALS — BP 130/78 | HR 76 | Temp 96.8°F | Resp 16 | Ht 62.0 in | Wt 264.0 lb

## 2018-05-02 DIAGNOSIS — Z1211 Encounter for screening for malignant neoplasm of colon: Secondary | ICD-10-CM

## 2018-05-02 HISTORY — PX: COLONOSCOPY: SHX174

## 2018-05-02 MED ORDER — SODIUM CHLORIDE 0.9 % IV SOLN: 500.0000 mL | Freq: Once | INTRAVENOUS | Status: DC

## 2018-05-02 NOTE — Op Note (Signed)
[facility] Patient Name: Tammie Solis Procedure Date: 05/02/2018 2:05 PM MRN: [id number] Endoscopist: Tressia Danas MD, MD Age: 52 Referring MD:  Date of Birth: 1965/08/03 Gender: Female Account #: [id number] Procedure:                Colonoscopy Indications:              Screening for colorectal malignant neoplasm. No                            known family history of colon cancer or polyps. No                            baseline GI symptoms. Medicines:                See the Anesthesia note for documentation of the                            administered medications Procedure:                Pre-Anesthesia Assessment:                           - Prior to the procedure, a History and Physical                            was performed, and patient medications and                            allergies were reviewed. The patient's tolerance of                            previous anesthesia was also reviewed. The risks                            and benefits of the procedure and the sedation                            options and risks were discussed with the patient.                            All questions were answered, and informed consent                            was obtained. Prior Anticoagulants: The patient has                            taken no previous anticoagulant or antiplatelet                            agents. ASA Grade Assessment: III - A patient with                            severe systemic disease. After reviewing the risks  and benefits, the patient was deemed in                            satisfactory condition to undergo the procedure.                           After obtaining informed consent, the colonoscope                            was passed under direct vision. Throughout the                            procedure, the patient's blood pressure, pulse, and                            oxygen saturations were monitored  continuously. The                            Colonoscope was introduced through the anus and                            advanced to the the terminal ileum, with                            identification of the appendiceal orifice and IC                            valve. The colonoscopy was performed without                            difficulty. The patient tolerated the procedure                            well. The quality of the bowel preparation was good. Scope In: 2:11:09 PM Scope Out: 2:22:30 PM Scope Withdrawal Time: 0 hours 5 minutes 38 seconds  Total Procedure Duration: 0 hours 11 minutes 21 seconds  Findings:                 The perianal and digital rectal examinations were                            normal.                           A few small-mouthed diverticula were found in the                            sigmoid colon and descending colon.                           The exam was otherwise without abnormality on                            direct and retroflexion views. Complications:  No immediate complications. Estimated Blood Loss:     Estimated blood loss: none. Estimated blood loss:                            none. Impression:               - The entire examined colon is normal on direct and                            retroflexion views.                           - No specimens collected. Recommendation:           - Discharge patient to home.                           - Resume previous diet today. High-fiber diet                            recommended.                           - Continue present medications.                           - Repeat colonoscopy in 10 years for screening                            purposes or earlier with the onset of any new                            gastrointestinal symptoms. Tressia Danas MD, MD 05/02/2018 2:26:25 PM This report has been signed electronically.

## 2018-05-02 NOTE — Patient Instructions (Signed)
Handouts provided:  Diverticulosis and High Fiber Diet  YOU HAD AN ENDOSCOPIC PROCEDURE TODAY AT THE [facility]:   Refer to the procedure report that was given to you for any specific questions about what was found during the examination.  If the procedure report does not answer your questions, please call your gastroenterologist to clarify.  If you requested that your care partner not be given the details of your procedure findings, then the procedure report has been included in a sealed envelope for you to review at your convenience later.  YOU SHOULD EXPECT: Some feelings of bloating in the abdomen. Passage of more gas than usual.  Walking can help get rid of the air that was put into your GI tract during the procedure and reduce the bloating. If you had a lower endoscopy (such as a colonoscopy or flexible sigmoidoscopy) you may notice spotting of blood in your stool or on the toilet paper. If you underwent a bowel prep for your procedure, you may not have a normal bowel movement for a few days.  Please Note:  You might notice some irritation and congestion in your nose or some drainage.  This is from the oxygen used during your procedure.  There is no need for concern and it should clear up in a day or so.  SYMPTOMS TO REPORT IMMEDIATELY:   Following lower endoscopy (colonoscopy or flexible sigmoidoscopy):  Excessive amounts of blood in the stool  Significant tenderness or worsening of abdominal pains  Swelling of the abdomen that is new, acute  Fever of 100F or higher  For urgent or emergent issues, a gastroenterologist can be reached at any hour by calling [phone].   DIET:  We do recommend a small meal at first, but then you may proceed to your regular diet.  Drink plenty of fluids but you should avoid alcoholic beverages for 24 hours.  ACTIVITY:  You should plan to take it easy for the rest of today and you should NOT DRIVE or use heavy machinery until tomorrow  (because of the sedation medicines used during the test).    FOLLOW UP: Our staff will call the number listed on your records the next business day following your procedure to check on you and address any questions or concerns that you may have regarding the information given to you following your procedure. If we do not reach you, we will leave a message.  However, if you are feeling well and you are not experiencing any problems, there is no need to return our call.  We will assume that you have returned to your regular daily activities without incident.  If any biopsies were taken you will be contacted by phone or by letter within the next 1-3 weeks.  Please call us at [phone] if you have not heard about the biopsies in 3 weeks.    SIGNATURES/CONFIDENTIALITY: You and/or your care partner have signed paperwork which will be entered into your electronic medical record.  These signatures attest to the fact that that the information above on your After Visit Summary has been reviewed and is understood.  Full responsibility of the confidentiality of this discharge information lies with you and/or your care-partner.

## 2018-05-02 NOTE — Progress Notes (Signed)
PT taken to PACU. Monitors in place. VSS. Report given to RN. 

## 2018-05-03 ENCOUNTER — Telehealth: Payer: Self-pay | Admitting: *Deleted

## 2018-05-03 NOTE — Telephone Encounter (Signed)
  Follow up Call-  Call back number 05/02/2018  Post procedure Call Back phone  # [phone]  Permission to leave phone message Yes  Some recent data might be hidden     Patient questions:  Do you have a fever, pain , or abdominal swelling? No. Pain Score  0 *  Have you tolerated food without any problems? Yes.    Have you been able to return to your normal activities? Yes.    Do you have any questions about your discharge instructions: Diet   No. Medications  No. Follow up visit  No.  Do you have questions or concerns about your Care? No.  Actions: * If pain score is 4 or above: No action needed, pain <4.

## 2019-04-13 ENCOUNTER — Ambulatory Visit (HOSPITAL_COMMUNITY)
Admission: EM | Admit: 2019-04-13 | Discharge: 2019-04-13 | Disposition: A | Discharge status: Home / Self Care | Payer: BC Managed Care – PPO | Attending: Family Medicine | Admitting: Family Medicine

## 2019-04-13 ENCOUNTER — Other Ambulatory Visit: Payer: Self-pay

## 2019-04-13 ENCOUNTER — Encounter (HOSPITAL_COMMUNITY): Payer: Self-pay

## 2019-04-13 DIAGNOSIS — B372 Candidiasis of skin and nail: Secondary | ICD-10-CM | POA: Diagnosis not present

## 2019-04-13 MED ORDER — NYSTATIN 100000 UNIT/GM EX POWD: Freq: Four times a day (QID) | CUTANEOUS | 0 refills | Status: DC

## 2019-04-13 MED ORDER — NYSTATIN-TRIAMCINOLONE 100000-0.1 UNIT/GM-% EX CREA: TOPICAL_CREAM | CUTANEOUS | 0 refills | Status: DC

## 2019-04-13 NOTE — Discharge Instructions (Signed)
Keep area clean and dry Apply cream 2 times a day and rub in thoroughly When the rash is cleared up, you will apply powder every morning and night to prevent recurrence Wash area, pat dry, and apply powder to dry skin Return as needed

## 2019-04-13 NOTE — ED Triage Notes (Signed)
Pt presents to UC w/ c/o rash on right lower abdomen fold. Pt states she noticed it yesterday but is unsure of when it started.

## 2019-04-13 NOTE — ED Provider Notes (Signed)
[facility]    CSN: [id number] Arrival date & time: 04/13/19  1233      History   Chief Complaint No chief complaint on file.   HPI Tammie Solis is a 53 y.o. female.   HPI  Rash underneath abdominal pannus on the right side.  Itching and stinging.  Malodorous.  Just noticed it yesterday.  Never had problems before.  States she is always been "plus sized".  Past Medical History:  Diagnosis Date  . Allergy   . Anemia   . Arthritis    knees   . Asthma   . GERD (gastroesophageal reflux disease)    occasionally   . Neuromuscular disorder [facility])    sciatica left leg    Patient Active Problem List   Diagnosis Date Noted  . Abnormal uterine bleeding (AUB) 03/25/2015    Past Surgical History:  Procedure Laterality Date  . CESAREAN SECTION     x2    OB History    Gravida  5   Para  2   Term  2   Preterm      AB  3   Living  2     SAB  2   TAB      Ectopic  1   Multiple      Live Births               Home Medications    Prior to Admission medications   Medication Sig Start Date End Date Taking? Authorizing Provider  Loratadine 10 MG CAPS Take by mouth.    [provider]  naproxen sodium (ALEVE) 220 MG tablet Take 220 mg by mouth 2 (two) times daily as needed.    [provider]  nystatin (MYCOSTATIN/NYSTOP) powder Apply topically 4 (four) times daily. 04/13/19   Eustace Moore, MD  nystatin-triamcinolone [facility] II) cream Apply to affected area daily 04/13/19   Eustace Moore, MD  OVER THE COUNTER MEDICATION daily. Alive Women Vitamin Gummies    [provider]  polyethylene glycol powder (MIRALAX) powder Take 1 Container by mouth once. 238 grams for colon prep 11-11    [provider]  Probiotic Product (RA PROBIOTIC GUMMIES PO) Take by mouth daily.    [provider]  albuterol (PROVENTIL HFA;VENTOLIN HFA) 108 (90 Base) MCG/ACT inhaler Inhale 2 puffs into the lungs every 4  (four) hours as needed for wheezing or shortness of breath. Patient not taking: Reported on 05/02/2018 11/11/16 04/13/19  Ward, Layla Maw, DO    Family History Family History  Problem Relation Age of Onset  . Diabetes Mother   . Kidney failure Mother   . Cancer Father        prostate  . Prostate cancer Father   . Colon cancer Neg Hx   . Colon polyps Neg Hx   . Esophageal cancer Neg Hx   . Rectal cancer Neg Hx   . Stomach cancer Neg Hx   . Pancreatic cancer Neg Hx     Social History Social History   Tobacco Use  . Smoking status: Never Smoker  . Smokeless tobacco: Never Used  Substance Use Topics  . Alcohol use: No  . Drug use: No     Allergies   Penicillins   Review of Systems Review of Systems  Constitutional: Negative for chills and fever.  HENT: Negative for ear pain and sore throat.   Eyes: Negative for pain and visual disturbance.  Respiratory: Negative for cough  and shortness of breath.   Cardiovascular: Negative for chest pain and palpitations.  Gastrointestinal: Negative for abdominal pain and vomiting.  Genitourinary: Negative for dysuria and hematuria.  Musculoskeletal: Negative for arthralgias and back pain.  Skin: Positive for rash. Negative for color change.  Neurological: Negative for seizures and syncope.  All other systems reviewed and are negative.    Physical Exam Triage Vital Signs ED Triage Vitals  Enc Vitals Group     BP 04/13/19 1343 (!) 154/70     Pulse Rate 04/13/19 1340 81     Resp 04/13/19 1340 16     Temp 04/13/19 1343 98.4 F (36.9 C)     Temp Source 04/13/19 1340 Oral     SpO2 04/13/19 1340 99 %     Weight --      Height --      Head Circumference --      Peak Flow --      Pain Score 04/13/19 1344 0     Pain Loc --      Pain Edu? --      Excl. in GC? --    No data found.  Updated Vital Signs BP (!) 154/70 (BP Location: Right Arm)   Pulse 78   Temp 98.4 F (36.9 C) (Oral)   Resp 16   SpO2 100%      Physical  Exam Constitutional:      General: She is not in acute distress.    Appearance: She is well-developed. She is obese.  HENT:     Head: Normocephalic and atraumatic.  Eyes:     Conjunctiva/sclera: Conjunctivae normal.     Pupils: Pupils are equal, round, and reactive to light.  Neck:     Musculoskeletal: Normal range of motion.  Cardiovascular:     Rate and Rhythm: Normal rate and regular rhythm.     Heart sounds: Normal heart sounds.  Pulmonary:     Effort: Pulmonary effort is normal. No respiratory distress.     Breath sounds: Normal breath sounds.  Abdominal:     General: There is no distension.     Palpations: Abdomen is soft.     Comments: Large abdominal pannus.  Underneath the right side there is erythema in the fold no tenderness.  No satellite lesions.  No odor  Musculoskeletal: Normal range of motion.  Skin:    General: Skin is warm and dry.  Neurological:     Mental Status: She is alert.      UC Treatments / Results  Labs (all labs ordered are listed, but only abnormal results are displayed) Labs Reviewed - No data to display  EKG   Radiology No results found.  Procedures Procedures (including critical care time)  Medications Ordered in UC Medications - No data to display  Initial Impression / Assessment and Plan / UC Course  I have reviewed the triage vital signs and the nursing notes.  Pertinent labs & imaging results that were available during my care of the patient were reviewed by me and considered in my medical decision making (see chart for details).     No evidence of cellulitis.  No open skin.  No tenderness.  Likely a candidal rash.  Treat accordingly and have her come back if worse Final Clinical Impressions(s) / UC Diagnoses   Final diagnoses:  Candidal intertrigo     Discharge Instructions     Keep area clean and dry Apply cream 2 times a day and rub in thoroughly  When the rash is cleared up, you will apply powder every morning  and night to prevent recurrence Wash area, pat dry, and apply powder to dry skin Return as needed   ED Prescriptions    Medication Sig Dispense Auth. Provider   nystatin-triamcinolone (MYCOLOG II) cream Apply to affected area daily 15 g Raylene Everts, MD   nystatin (MYCOSTATIN/NYSTOP) powder Apply topically 4 (four) times daily. 15 g Raylene Everts, MD     PDMP not reviewed this encounter.   Raylene Everts, MD 04/13/19 [phone]

## 2019-08-17 ENCOUNTER — Ambulatory Visit: Payer: BC Managed Care – PPO | Attending: Family

## 2019-08-17 DIAGNOSIS — Z23 Encounter for immunization: Secondary | ICD-10-CM | POA: Insufficient documentation

## 2019-08-17 NOTE — Progress Notes (Signed)
   Covid-19 Vaccination Clinic  Name:  Tammie Solis    MRN: [id number] DOB: January 20, 1966  08/17/2019  [name] was observed post Covid-19 immunization for 15 minutes without incidence. She was provided with Vaccine Information Sheet and instruction to access the V-Safe system.   [name] was instructed to call 911 with any severe reactions post vaccine: Marland Kitchen Difficulty breathing  . Swelling of your face and throat  . A fast heartbeat  . A bad rash all over your body  . Dizziness and weakness    Immunizations Administered    Name Date Dose VIS Date Route   Moderna COVID-19 Vaccine 08/17/2019  2:07 PM 0.5 mL 05/23/2019 Intramuscular   Manufacturer: Moderna   Lot: [id number]   NDC: 62035-597-41

## 2019-09-19 ENCOUNTER — Ambulatory Visit: Payer: BC Managed Care – PPO | Attending: Family

## 2019-09-19 DIAGNOSIS — Z23 Encounter for immunization: Secondary | ICD-10-CM

## 2019-09-19 NOTE — Progress Notes (Signed)
   Covid-19 Vaccination Clinic  Name:  Tammie Solis    MRN: [id number] DOB: 03-25-1966  09/19/2019  [name] was observed post Covid-19 immunization for 15 minutes without incident. She was provided with Vaccine Information Sheet and instruction to access the V-Safe system.   [name] was instructed to call 911 with any severe reactions post vaccine: Marland Kitchen Difficulty breathing  . Swelling of face and throat  . A fast heartbeat  . A bad rash all over body  . Dizziness and weakness   Immunizations Administered    Name Date Dose VIS Date Route   Moderna COVID-19 Vaccine 09/19/2019  1:09 PM 0.5 mL 05/23/2019 Intramuscular   Manufacturer: Moderna   Lot: [id number]   NDC: 90300-923-30

## 2020-03-01 ENCOUNTER — Ambulatory Visit (HOSPITAL_COMMUNITY)
Admission: EM | Admit: 2020-03-01 | Discharge: 2020-03-01 | Disposition: A | Discharge status: Home / Self Care | Payer: BC Managed Care – PPO | Attending: Physician Assistant | Admitting: Physician Assistant

## 2020-03-01 ENCOUNTER — Other Ambulatory Visit: Payer: Self-pay

## 2020-03-01 ENCOUNTER — Encounter (HOSPITAL_COMMUNITY): Payer: Self-pay

## 2020-03-01 DIAGNOSIS — M5432 Sciatica, left side: Secondary | ICD-10-CM | POA: Diagnosis not present

## 2020-03-01 DIAGNOSIS — G8929 Other chronic pain: Secondary | ICD-10-CM

## 2020-03-01 DIAGNOSIS — R030 Elevated blood-pressure reading, without diagnosis of hypertension: Secondary | ICD-10-CM

## 2020-03-01 DIAGNOSIS — M25562 Pain in left knee: Secondary | ICD-10-CM

## 2020-03-01 MED ORDER — DICLOFENAC SODIUM 1 % EX GEL: 4.0000 g | Freq: Four times a day (QID) | CUTANEOUS | 0 refills | Status: DC

## 2020-03-01 MED ORDER — NAPROXEN 500 MG PO TABS: 500.0000 mg | ORAL_TABLET | Freq: Two times a day (BID) | ORAL | 0 refills | Status: AC

## 2020-03-01 MED ORDER — TIZANIDINE HCL 4 MG PO TABS: 4.0000 mg | ORAL_TABLET | Freq: Every day | ORAL | 0 refills | Status: AC

## 2020-03-01 NOTE — Discharge Instructions (Signed)
Take the naproxen as prescribed with food for the next 3 to 4 days and then as needed Take the Zanaflex/tizanidine as a muscle relaxer only at night, this will make you sleepy do not drive, drink alcohol or operate machinery within 8 hours of taking Apply the gel to your left knee up to 4 times a day  Call the internal medicine center today take their soonest available appointment, you need to have your blood pressure rechecked  Call the sports medicine group for follow-up of your chronic leg pains  If you develop chest pain shortness of breath or severe headache or other concerning symptoms go to emergency department

## 2020-03-01 NOTE — ED Provider Notes (Signed)
[facility]    CSN: [id number] Arrival date & time: 03/01/20  1142      History   Chief Complaint No chief complaint on file.   HPI Tammie Solis is a 54 y.o. female.   Patient with history of sciatica and neuropathic pain in lower extremities presents for left-sided leg pain.  She reports pain will at times start in her hip and radiate down her leg.  Other times she will note pain starting in the knee and radiating down the rest of her leg.  She reports this comes and goes.  Is been more frequent lately.  She works on her feet a lot.  She reports bending causes more pain.  Denies any change in bowel or bladder.  Denies weakness, tingling.  Denies any traumas.  Has occasionally tried ibuprofen without much relief.   States she has never been diagnosed with high blood pressure.  Denies any chest pain shortness of breath.  Denies headache, dizziness.     Past Medical History:  Diagnosis Date  . Allergy   . Anemia   . Arthritis    knees   . Asthma   . GERD (gastroesophageal reflux disease)    occasionally   . Neuromuscular disorder [facility])    sciatica left leg    Patient Active Problem List   Diagnosis Date Noted  . Abnormal uterine bleeding (AUB) 03/25/2015    Past Surgical History:  Procedure Laterality Date  . CESAREAN SECTION     x2    OB History    Gravida  5   Para  2   Term  2   Preterm      AB  3   Living  2     SAB  2   TAB      Ectopic  1   Multiple      Live Births               Home Medications    Prior to Admission medications   Medication Sig Start Date End Date Taking? Authorizing Provider  Loratadine 10 MG CAPS Take by mouth.   Yes [provider]  naproxen sodium (ALEVE) 220 MG tablet Take 220 mg by mouth 2 (two) times daily as needed.   Yes [provider]  polyethylene glycol powder (MIRALAX) powder Take 1 Container by mouth once. 238 grams for colon prep 11-11   Yes [provider]    diclofenac Sodium (VOLTAREN) 1 % GEL Apply 4 g topically 4 (four) times daily. 03/01/20   Latonyia Lopata, Veryl Speak, PA-C  naproxen (NAPROSYN) 500 MG tablet Take 1 tablet (500 mg total) by mouth 2 (two) times daily with a meal for 7 days. 03/01/20 03/08/20  Shaketha Jeon, Veryl Speak, PA-C  nystatin (MYCOSTATIN/NYSTOP) powder Apply topically 4 (four) times daily. 04/13/19   Eustace Moore, MD  nystatin-triamcinolone [facility] II) cream Apply to affected area daily 04/13/19   Eustace Moore, MD  OVER THE COUNTER MEDICATION daily. Alive Women Vitamin Gummies    [provider]  Probiotic Product (RA PROBIOTIC GUMMIES PO) Take by mouth daily.    [provider]  tiZANidine (ZANAFLEX) 4 MG tablet Take 1 tablet (4 mg total) by mouth at bedtime for 10 days. 03/01/20 03/11/20  Kongmeng Santoro, Veryl Speak, PA-C  albuterol (PROVENTIL HFA;VENTOLIN HFA) 108 (90 Base) MCG/ACT inhaler Inhale 2 puffs into the lungs every 4 (four) hours as needed for wheezing or shortness of breath. Patient not taking: Reported  on 05/02/2018 11/11/16 04/13/19  Ward, Layla Maw, DO    Family History Family History  Problem Relation Age of Onset  . Diabetes Mother   . Kidney failure Mother   . Cancer Father        prostate  . Prostate cancer Father   . Colon cancer Neg Hx   . Colon polyps Neg Hx   . Esophageal cancer Neg Hx   . Rectal cancer Neg Hx   . Stomach cancer Neg Hx   . Pancreatic cancer Neg Hx     Social History Social History   Tobacco Use  . Smoking status: Never Smoker  . Smokeless tobacco: Never Used  Substance Use Topics  . Alcohol use: No  . Drug use: No     Allergies   Penicillins   Review of Systems Review of Systems   Physical Exam Triage Vital Signs ED Triage Vitals  Enc Vitals Group     BP 03/01/20 1319 (!) 194/84     Pulse Rate 03/01/20 1319 76     Resp 03/01/20 1319 20     Temp 03/01/20 1319 98.6 F (37 C)     Temp Source 03/01/20 1319 Oral     SpO2 03/01/20 1319 98 %     Weight --       Height --      Head Circumference --      Peak Flow --      Pain Score 03/01/20 1317 10     Pain Loc --      Pain Edu? --      Excl. in GC? --    No data found.  Updated Vital Signs BP (!) 194/84 (BP Location: Right Wrist)   Pulse 76   Temp 98.6 F (37 C) (Oral)   Resp 20   SpO2 98%   Provider recheck of blood pressure 166/76 Visual Acuity Right Eye Distance:   Left Eye Distance:   Bilateral Distance:    Right Eye Near:   Left Eye Near:    Bilateral Near:     Physical Exam Vitals and nursing note reviewed.  Constitutional:      General: She is not in acute distress.    Appearance: She is well-developed. She is not ill-appearing.  HENT:     Head: Normocephalic and atraumatic.  Eyes:     Conjunctiva/sclera: Conjunctivae normal.  Cardiovascular:     Rate and Rhythm: Normal rate and regular rhythm.     Heart sounds: No murmur heard.   Pulmonary:     Effort: Pulmonary effort is normal. No respiratory distress.     Breath sounds: Normal breath sounds.  Abdominal:     Palpations: Abdomen is soft.     Tenderness: There is no abdominal tenderness.  Musculoskeletal:     Cervical back: Neck supple.     Comments: There is some tenderness to palpation in the lower lumbar spinal musculature into the left gluteus.  No significant tenderness of the musculature of the upper leg or lower leg.  There is no lower leg swelling.  Patient is ambulatory without a lot of issue.  Full range of motion at the hip and knee.  Some pain elicited with straight leg raise of the left leg.  No pain with FABER or FADIR.  Knee joint stable to anterior and posterior drawer.  No bony tenderness.  Distal pulse 2+.  Sensation intact.  Strength is 5 out of 5 equal bilaterally lower extremities.  Skin:  General: Skin is warm and dry.  Neurological:     Mental Status: She is alert.      UC Treatments / Results  Labs (all labs ordered are listed, but only abnormal results are displayed) Labs  Reviewed - No data to display  EKG   Radiology No results found.  Procedures Procedures (including critical care time)  Medications Ordered in UC Medications - No data to display  Initial Impression / Assessment and Plan / UC Course  I have reviewed the triage vital signs and the nursing notes.  Pertinent labs & imaging results that were available during my care of the patient were reviewed by me and considered in my medical decision making (see chart for details).     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  Also found to have elevated blood pressure in clinic.  Asymptomatic from blood pressure standpoint.  May be elevated due to pain.  Will trial NSAID and muscle relaxer.  Encouraging her to establish with primary care follow-up on her blood pressure.  Encouraged her to follow-up with sports medicine group to discuss her chronic lower extremity issues.  Discussed return, follow-up and emergency room precautions.  Patient verbalized understand plan of care Final Clinical Impressions(s) / UC Diagnoses   Final diagnoses:  Sciatica of left side  Elevated blood pressure reading in office without diagnosis of hypertension  Chronic pain of left knee     Discharge Instructions     Take the naproxen as prescribed with food for the next 3 to 4 days and then as needed Take the Zanaflex/tizanidine as a muscle relaxer only at night, this will make you sleepy do not drive, drink alcohol or operate machinery within 8 hours of taking Apply the gel to your left knee up to 4 times a day  Call the internal medicine center today take their soonest available appointment, you need to have your blood pressure rechecked  Call the sports medicine group for follow-up of your chronic leg pains  If you develop chest pain shortness of breath  or severe headache or other concerning symptoms go to emergency department      ED Prescriptions    Medication Sig Dispense Auth. Provider   naproxen (NAPROSYN) 500 MG tablet Take 1 tablet (500 mg total) by mouth 2 (two) times daily with a meal for 7 days. 14 tablet Guillermina Shaft, Veryl Speak, PA-C   tiZANidine (ZANAFLEX) 4 MG tablet Take 1 tablet (4 mg total) by mouth at bedtime for 10 days. 10 tablet Kycen Spalla, Veryl Speak, PA-C   diclofenac Sodium (VOLTAREN) 1 % GEL Apply 4 g topically 4 (four) times daily. 100 g Penni Penado, Veryl Speak, PA-C     PDMP not reviewed this encounter.   Hermelinda Medicus, PA-C 03/01/20 2306

## 2020-03-01 NOTE — ED Triage Notes (Signed)
Pt c/o left hip/leg pain chronic in nature but worsening for the past two weeks. Pt describes pain along anterior aspect hip/leg/back area and radiates to ankle. Also c/o right knee pain chronic in nature. Denies numbness, tingling to feet. +2 DP pulses bilaterally, +1 edema to BLE.

## 2020-03-11 ENCOUNTER — Ambulatory Visit: Payer: BC Managed Care – PPO | Admitting: Family Medicine

## 2020-03-12 ENCOUNTER — Encounter: Payer: Self-pay | Admitting: Student

## 2020-03-12 ENCOUNTER — Other Ambulatory Visit: Payer: Self-pay

## 2020-03-12 ENCOUNTER — Ambulatory Visit: Payer: BC Managed Care – PPO | Admitting: Student

## 2020-03-12 VITALS — BP 175/61 | HR 67 | Temp 99.1°F | Ht 62.0 in | Wt 274.7 lb

## 2020-03-12 DIAGNOSIS — Z114 Encounter for screening for human immunodeficiency virus [HIV]: Secondary | ICD-10-CM

## 2020-03-12 DIAGNOSIS — Z833 Family history of diabetes mellitus: Secondary | ICD-10-CM

## 2020-03-12 DIAGNOSIS — D509 Iron deficiency anemia, unspecified: Secondary | ICD-10-CM | POA: Insufficient documentation

## 2020-03-12 DIAGNOSIS — M7062 Trochanteric bursitis, left hip: Secondary | ICD-10-CM | POA: Insufficient documentation

## 2020-03-12 DIAGNOSIS — M1612 Unilateral primary osteoarthritis, left hip: Secondary | ICD-10-CM | POA: Diagnosis not present

## 2020-03-12 DIAGNOSIS — I10 Essential (primary) hypertension: Secondary | ICD-10-CM

## 2020-03-12 DIAGNOSIS — Z1159 Encounter for screening for other viral diseases: Secondary | ICD-10-CM

## 2020-03-12 DIAGNOSIS — Z0000 Encounter for general adult medical examination without abnormal findings: Secondary | ICD-10-CM

## 2020-03-12 LAB — GLUCOSE, CAPILLARY: Glucose-Capillary: 96 mg/dL (ref 70–99)

## 2020-03-12 LAB — POCT GLYCOSYLATED HEMOGLOBIN (HGB A1C): Hemoglobin A1C: 5.3 % (ref 4.0–5.6)

## 2020-03-12 MED ORDER — NAPROXEN SODIUM 220 MG PO TABS: 220.0000 mg | ORAL_TABLET | Freq: Two times a day (BID) | ORAL | 2 refills | Status: DC | PRN

## 2020-03-12 MED ORDER — LISINOPRIL-HYDROCHLOROTHIAZIDE 10-12.5 MG PO TABS: 1.0000 | ORAL_TABLET | Freq: Every day | ORAL | 3 refills | Status: DC

## 2020-03-12 MED ORDER — DICLOFENAC SODIUM 1 % EX GEL: 4.0000 g | Freq: Two times a day (BID) | CUTANEOUS | 0 refills | Status: DC

## 2020-03-12 NOTE — Patient Instructions (Addendum)
Dear Tammie Solis,  It was a pleasure meeting you in clinic today.  For your hypertension, we will start a medication that is a combination of two medications to lower your blood pressure (lisinopril and HCTZ). We will also obtain some labs which I will call you to discuss the results.  For your hip pain and leg pain, continue taking the naproxen twice daily as needed, applying voltaren gel twice daily as needed, and we have placed a referral to physical therapy.  Finally, for your weight management, we recommend attempting the DASH diet which I have included below. Also, your weight loss should help with your leg pain. As your leg pain improves with physical therapy, your activity level should increase as well.  Otherwise, if you have any new symptoms or concerns please let us know. We will schedule a follow-up in 4 weeks to see how you are doing, but please come to clinic sooner if you desire.  Sincerely, Despina Hidden, MD   DASH Eating Plan DASH stands for "Dietary Approaches to Stop Hypertension." The DASH eating plan is a healthy eating plan that has been shown to reduce high blood pressure (hypertension). It may also reduce your risk for type 2 diabetes, heart disease, and stroke. The DASH eating plan may also help with weight loss. What are tips for following this plan?  General guidelines  Avoid eating more than 2,300 mg (milligrams) of salt (sodium) a day. If you have hypertension, you may need to reduce your sodium intake to 1,500 mg a day.  Limit alcohol intake to no more than 1 drink a day for nonpregnant women and 2 drinks a day for men. One drink equals 12 oz of beer, 5 oz of wine, or 1 oz of hard liquor.  Work with your health care provider to maintain a healthy body weight or to lose weight. Ask what an ideal weight is for you.  Get at least 30 minutes of exercise that causes your heart to beat faster (aerobic exercise) most days of the week. Activities may include walking,  swimming, or biking.  Work with your health care provider or diet and nutrition specialist (dietitian) to adjust your eating plan to your individual calorie needs. Reading food labels   Check food labels for the amount of sodium per serving. Choose foods with less than 5 percent of the Daily Value of sodium. Generally, foods with less than 300 mg of sodium per serving fit into this eating plan.  To find whole grains, look for the word "whole" as the first word in the ingredient list. Shopping  Buy products labeled as "low-sodium" or "no salt added."  Buy fresh foods. Avoid canned foods and premade or frozen meals. Cooking  Avoid adding salt when cooking. Use salt-free seasonings or herbs instead of table salt or sea salt. Check with your health care provider or pharmacist before using salt substitutes.  Do not fry foods. Cook foods using healthy methods such as baking, boiling, grilling, and broiling instead.  Cook with heart-healthy oils, such as olive, canola, soybean, or sunflower oil. Meal planning  Eat a balanced diet that includes: ? 5 or more servings of fruits and vegetables each day. At each meal, try to fill half of your plate with fruits and vegetables. ? Up to 6-8 servings of whole grains each day. ? Less than 6 oz of lean meat, poultry, or fish each day. A 3-oz serving of meat is about the same size as a deck of  cards. One egg equals 1 oz. ? 2 servings of low-fat dairy each day. ? A serving of nuts, seeds, or beans 5 times each week. ? Heart-healthy fats. Healthy fats called Omega-3 fatty acids are found in foods such as flaxseeds and coldwater fish, like sardines, salmon, and mackerel.  Limit how much you eat of the following: ? Canned or prepackaged foods. ? Food that is high in trans fat, such as fried foods. ? Food that is high in saturated fat, such as fatty meat. ? Sweets, desserts, sugary drinks, and other foods with added sugar. ? Full-fat dairy  products.  Do not salt foods before eating.  Try to eat at least 2 vegetarian meals each week.  Eat more home-cooked food and less restaurant, buffet, and fast food.  When eating at a restaurant, ask that your food be prepared with less salt or no salt, if possible. What foods are recommended? The items listed may not be a complete list. Talk with your dietitian about what dietary choices are best for you. Grains Whole-grain or whole-wheat bread. Whole-grain or whole-wheat pasta. Brown rice. Modena Morrow. Bulgur. Whole-grain and low-sodium cereals. Pita bread. Low-fat, low-sodium crackers. Whole-wheat flour tortillas. Vegetables Fresh or frozen vegetables (raw, steamed, roasted, or grilled). Low-sodium or reduced-sodium tomato and vegetable juice. Low-sodium or reduced-sodium tomato sauce and tomato paste. Low-sodium or reduced-sodium canned vegetables. Fruits All fresh, dried, or frozen fruit. Canned fruit in natural juice (without added sugar). Meat and other protein foods Skinless chicken or [place]. Ground chicken or [place]. Pork with fat trimmed off. Fish and seafood. Egg whites. Dried beans, peas, or lentils. Unsalted nuts, nut butters, and seeds. Unsalted canned beans. Lean cuts of beef with fat trimmed off. Low-sodium, lean deli meat. Dairy Low-fat (1%) or fat-free (skim) milk. Fat-free, low-fat, or reduced-fat cheeses. Nonfat, low-sodium ricotta or cottage cheese. Low-fat or nonfat yogurt. Low-fat, low-sodium cheese. Fats and oils Soft margarine without trans fats. Vegetable oil. Low-fat, reduced-fat, or light mayonnaise and salad dressings (reduced-sodium). Canola, safflower, olive, soybean, and sunflower oils. Avocado. Seasoning and other foods Herbs. Spices. Seasoning mixes without salt. Unsalted popcorn and pretzels. Fat-free sweets. What foods are not recommended? The items listed may not be a complete list. Talk with your dietitian about what dietary choices are best for  you. Grains Baked goods made with fat, such as croissants, muffins, or some breads. Dry pasta or rice meal packs. Vegetables Creamed or fried vegetables. Vegetables in a cheese sauce. Regular canned vegetables (not low-sodium or reduced-sodium). Regular canned tomato sauce and paste (not low-sodium or reduced-sodium). Regular tomato and vegetable juice (not low-sodium or reduced-sodium). Angie Fava. Olives. Fruits Canned fruit in a light or heavy syrup. Fried fruit. Fruit in cream or butter sauce. Meat and other protein foods Fatty cuts of meat. Ribs. Fried meat. Berniece Salines. Sausage. Bologna and other processed lunch meats. Salami. Fatback. Hotdogs. Bratwurst. Salted nuts and seeds. Canned beans with added salt. Canned or smoked fish. Whole eggs or egg yolks. Chicken or [place] with skin. Dairy Whole or 2% milk, cream, and half-and-half. Whole or full-fat cream cheese. Whole-fat or sweetened yogurt. Full-fat cheese. Nondairy creamers. Whipped toppings. Processed cheese and cheese spreads. Fats and oils Butter. Stick margarine. Lard. Shortening. Ghee. Bacon fat. Tropical oils, such as coconut, palm kernel, or palm oil. Seasoning and other foods Salted popcorn and pretzels. Onion salt, garlic salt, seasoned salt, table salt, and sea salt. Worcestershire sauce. Tartar sauce. Barbecue sauce. Teriyaki sauce. Soy sauce, including reduced-sodium. Steak sauce. Canned and packaged  gravies. Fish sauce. Oyster sauce. Cocktail sauce. Horseradish that you find on the shelf. Ketchup. Mustard. Meat flavorings and tenderizers. Bouillon cubes. Hot sauce and Tabasco sauce. Premade or packaged marinades. Premade or packaged taco seasonings. Relishes. Regular salad dressings. Where to find more information:  National Heart, Lung, and [place]: https://wilson-eaton.com/  American Heart Association: www.heart.org Summary  The DASH eating plan is a healthy eating plan that has been shown to reduce high blood pressure  (hypertension). It may also reduce your risk for type 2 diabetes, heart disease, and stroke.  With the DASH eating plan, you should limit salt (sodium) intake to 2,300 mg a day. If you have hypertension, you may need to reduce your sodium intake to 1,500 mg a day.  When on the DASH eating plan, aim to eat more fresh fruits and vegetables, whole grains, lean proteins, low-fat dairy, and heart-healthy fats.  Work with your health care provider or diet and nutrition specialist (dietitian) to adjust your eating plan to your individual calorie needs. This information is not intended to replace advice given to you by your health care provider. Make sure you discuss any questions you have with your health care provider. Document Revised: 05/21/2017 Document Reviewed: 06/01/2016 Elsevier Patient Education  2020 [employer].

## 2020-03-12 NOTE — Assessment & Plan Note (Signed)
Patient reports taking iron supplementation once weekly, but is unsure why she takes this medication. Per chart review, patient had been diagnosed with anemia in the past. No recent labs. -CBC -Ferritin -Continue once weekly dosing of iron for now

## 2020-03-12 NOTE — Assessment & Plan Note (Addendum)
Patient's blood pressure initially 166/79 with repeat recording at end of visit 175/61. She reports that her blood pressure is always elevated at office visits due to white coat hypertension, however she doesn't have a blood pressure monitor at home to check her pressure in any other setting. Given the dramatic elevation in her blood pressure today and recent ED visit, starting an antihypertensive is warranted. -Start lisinopril-HCTZ 10mg -12.5mg  combination pill -Obtain BMP -Obtain HbA1c -Recheck blood pressure at next visit and increase medication regimen as tolerated

## 2020-03-12 NOTE — Progress Notes (Signed)
   CC: Establish care  HPI:  [name] is a 54 y.o. with past medical history of chronic leg pain, anemia, arthritis, GERD who presents to clinic as a new patient visit. Please, refer to Problem List for Assessment/Plan based charting of patient's chronic medical conditions as well as any acute problems addressed at this visit.  Past Medical History: Asthma Seasonal allergies Osteoarthritis of right knee Sciatica in left leg History of anemia  Past Surgical History: None  Medications: Probiotics Iron once weekly Aspirin daily Multivitamin Claritin  Allergies: Penicillins - cold sensation, shakes  Family History: Mother, deceased - T2DM, HTN Father, deceased - Prostate cancer Sister, deceased - AIDS Brother, living - healthy  Social History: Two children, son and daughter. Lives in [place] with daughter. No tobacco use, occasional alcohol use, no recreational drug use  Review of Systems:  Denies fevers, chills, chest pain, shortness of breath, nausea, vomiting, abdominal pain. Endorses left hip pain, bilateral knee pain, left leg weakness.  Physical Exam:  Vitals:   03/12/20 1537  BP: (!) 166/79  Pulse: 75  Temp: 99.1 F (37.3 C)  TempSrc: Oral  SpO2: 100%  Weight: 274 lb 11.2 oz (124.6 kg)  Height: 5\' 2"  (1.575 m)   Physical Exam Constitutional:      Appearance: She is obese.  HENT:     Head: Normocephalic and atraumatic.  Eyes:     Extraocular Movements: Extraocular movements intact.     Conjunctiva/sclera: Conjunctivae normal.  Cardiovascular:     Rate and Rhythm: Normal rate and regular rhythm.     Pulses: Normal pulses.     Heart sounds: Normal heart sounds.  Pulmonary:     Effort: Pulmonary effort is normal.     Breath sounds: Normal breath sounds.  Abdominal:     General: Abdomen is flat. Bowel sounds are normal.     Palpations: Abdomen is soft.  Musculoskeletal:        General: Tenderness present. Normal range of motion.      Cervical back: Normal range of motion and neck supple.     Right lower leg: Edema present.     Left lower leg: Edema present.     Comments: Patient has tenderness to palpation of left hip. Patient has limitation and pain with internal rotation of the left hip. Weakness with hip flexion.  Skin:    General: Skin is warm and dry.     Capillary Refill: Capillary refill takes less than 2 seconds.  Neurological:     General: No focal deficit present.     Mental Status: She is alert. Mental status is at baseline.  Psychiatric:        Mood and Affect: Mood normal.        Behavior: Behavior normal.        Thought Content: Thought content normal.        Judgment: Judgment normal.    Assessment & Plan:   See Encounters Tab for problem based charting.  Patient seen with Dr. 

## 2020-03-12 NOTE — Assessment & Plan Note (Signed)
Patient reports left hip pain ongoing for two years. Her left hip is tender to palpation on physical examination. 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  -Refilled naproxen which she reports helps with her pain -Can consider steroid injection at future visit

## 2020-03-12 NOTE — Assessment & Plan Note (Signed)
Patient reports two year history of left hip pain which shoots down to her ankle. She states that the symptoms can be severe at times, but currently the pain is mild. The pain worsens with walking and improves with naproxen. She has been seen in urgent care multiple times for this complaint over the past two years. She was referred to sports medicine previously, but would prefer to have her PCP manage this condition. Physical examination reveals pain with internal rotation of the hip with weakness on flexion of the hip. Her symptoms are likely secondary to osteoarthritis of the left hip. She would benefit greatly from physical therapy, weight loss, and continued NSAIDs as needed. -Physical therapy referral -Weight loss -Naproxen PRN -Voltaren gel as patient states this helps

## 2020-03-12 NOTE — Assessment & Plan Note (Signed)
Patient reports significant weight gain over the past few years. She attributes this to mostly consuming fast food and limited physical activity due to her chronic leg pain. She is interested in working on this condition. -Provided counseling on diet and exercise -Provided information on DASH Diet -Can consider GLP-1 agonist if patient's HbA1c reveals diabetes

## 2020-03-14 ENCOUNTER — Other Ambulatory Visit: Payer: Self-pay | Admitting: Student

## 2020-03-14 DIAGNOSIS — M1612 Unilateral primary osteoarthritis, left hip: Secondary | ICD-10-CM

## 2020-03-14 LAB — BMP8+ANION GAP
Anion Gap: 18 mmol/L (ref 10.0–18.0)
BUN/Creatinine Ratio: 21 (ref 9–23)
BUN: 15 mg/dL (ref 6–24)
CO2: 22 mmol/L (ref 20–29)
Calcium: 9.7 mg/dL (ref 8.7–10.2)
Chloride: 101 mmol/L (ref 96–106)
Creatinine, Ser: 0.73 mg/dL (ref 0.57–1.00)
GFR calc Af Amer: 106 mL/min/{1.73_m2} (ref >59)
GFR calc non Af Amer: 92 mL/min/{1.73_m2} (ref >59)
Glucose: 86 mg/dL (ref 65–99)
Potassium: 4.4 mmol/L (ref 3.5–5.2)
Sodium: 141 mmol/L (ref 134–144)

## 2020-03-14 LAB — HIV ANTIBODY (ROUTINE TESTING W REFLEX): HIV Screen 4th Generation wRfx: NONREACTIVE

## 2020-03-14 LAB — CBC
Hematocrit: 42 % (ref 34.0–46.6)
Hemoglobin: 13.1 g/dL (ref 11.1–15.9)
MCH: 22.7 pg — ABNORMAL LOW (ref 26.6–33.0)
MCHC: 31.2 g/dL — ABNORMAL LOW (ref 31.5–35.7)
MCV: 73 fL — ABNORMAL LOW (ref 79–97)
Platelets: 287 10*3/uL (ref 150–450)
RBC: 5.78 x10E6/uL — ABNORMAL HIGH (ref 3.77–5.28)
RDW: 16.1 % — ABNORMAL HIGH (ref 11.7–15.4)
WBC: 7.4 10*3/uL (ref 3.4–10.8)

## 2020-03-14 LAB — HEPATITIS C ANTIBODY: Hep C Virus Ab: <0.1 s/co ratio (ref 0.0–0.9)

## 2020-03-14 LAB — FERRITIN: Ferritin: 101 ng/mL (ref 15–150)

## 2020-03-14 MED ORDER — NAPROXEN 500 MG PO TABS: 500.0000 mg | ORAL_TABLET | Freq: Two times a day (BID) | ORAL | 2 refills | Status: DC

## 2020-03-14 NOTE — Progress Notes (Signed)
Internal Medicine Clinic Attending  I saw and evaluated the patient.  I personally confirmed the key portions of the history and exam documented by Dr. Johnson and I reviewed pertinent patient test results.  The assessment, diagnosis, and plan were formulated together and I agree with the documentation in the resident's note.  

## 2020-03-20 ENCOUNTER — Other Ambulatory Visit: Payer: Self-pay | Admitting: Student

## 2020-03-20 DIAGNOSIS — Z1231 Encounter for screening mammogram for malignant neoplasm of breast: Secondary | ICD-10-CM

## 2020-04-08 ENCOUNTER — Ambulatory Visit: Payer: BC Managed Care – PPO | Admitting: Physical Therapy

## 2020-04-10 ENCOUNTER — Telehealth: Payer: Self-pay

## 2020-04-10 NOTE — Telephone Encounter (Signed)
Pls contact Tamia [phone] at [facility]

## 2020-04-10 NOTE — Telephone Encounter (Signed)
Return call to Tamia, at [facility]- stated they had called for a rx which they received this morning so they do not need anything else.

## 2020-05-02 ENCOUNTER — Ambulatory Visit: Payer: BC Managed Care – PPO | Attending: Family

## 2020-05-02 DIAGNOSIS — Z23 Encounter for immunization: Secondary | ICD-10-CM

## 2020-05-06 ENCOUNTER — Inpatient Hospital Stay: Admission: RE | Admit: 2020-05-06 | Payer: BC Managed Care – PPO | Source: Ambulatory Visit

## 2020-07-26 ENCOUNTER — Other Ambulatory Visit: Payer: Self-pay

## 2020-07-26 ENCOUNTER — Ambulatory Visit: Payer: BC Managed Care – PPO | Admitting: Student

## 2020-07-26 ENCOUNTER — Encounter: Payer: Self-pay | Admitting: Student

## 2020-07-26 DIAGNOSIS — I10 Essential (primary) hypertension: Secondary | ICD-10-CM | POA: Diagnosis not present

## 2020-07-26 DIAGNOSIS — E7841 Elevated Lipoprotein(a): Secondary | ICD-10-CM

## 2020-07-26 DIAGNOSIS — M1712 Unilateral primary osteoarthritis, left knee: Secondary | ICD-10-CM | POA: Insufficient documentation

## 2020-07-26 MED ORDER — ACETAMINOPHEN ER 650 MG PO TBCR: 650.0000 mg | EXTENDED_RELEASE_TABLET | Freq: Three times a day (TID) | ORAL | 0 refills | Status: DC | PRN

## 2020-07-26 MED ORDER — LISINOPRIL-HYDROCHLOROTHIAZIDE 10-12.5 MG PO TABS: 1.0000 | ORAL_TABLET | Freq: Every day | ORAL | 3 refills | Status: DC

## 2020-07-26 NOTE — Assessment & Plan Note (Signed)
Patient continues to gain weight. Her physical inactivity is likely limited due to her persistent left knee pain. -Steroid injection of left knee -Continue to encourage weight loss -Obtain TSH to rule out associated thyroid disorder

## 2020-07-26 NOTE — Assessment & Plan Note (Signed)
Patient reports several year history of progressive posterior knee pain. She endorses stiffness upon awakening and needs to swing her leg to "loosen it up." She states that the pain has limited her mobility which she attributes to her continued weight gain. She denies swelling, redness or tenderness of the knee. She has tried using naproxen and voltaren gel without improvement. She did not desire to follow-up with physical therapy or sports medicine referrals. She is concerned that she may eventually need a knee replacement. On physical examination, patient has no erythema, swelling, tenderness to palpation, patient has full range of motion of the knee with palpable crepitus over the anterior aspect of the knee.   Patient would benefit from trial of steroid injection today, continuation of analgesics and close follow-up. -Acetaminophen 650mg  Q8H PRN -Knee injection today -Discussed recommendation for physical therapy, however patient declined -Discussed importance of weight loss in alleviating knee pain

## 2020-07-26 NOTE — Progress Notes (Signed)
   CC: Left knee pain, medication refills  HPI:  Tammie Solis is a 55 y.o. with past medical history significant for HTN and osteoarthritis of the left knee who presents to clinic for routine follow-up. Refer to problem list for charting of this encounter.  Past Medical History:  Diagnosis Date  . Allergy   . Anemia   . Arthritis    knees   . Asthma   . GERD (gastroesophageal reflux disease)    occasionally   . Neuromuscular disorder (HCC)    sciatica left leg   Review of Systems:  Endorses left knee pain. Denies left knee swelling, redness, tenderness, injury, fevers, chills.  Physical Exam:  Vitals:   07/26/20 1003  BP: 139/67  Pulse: 87  Temp: 98.5 F (36.9 C)  TempSrc: Oral  SpO2: 100%  Weight: 278 lb 4.8 oz (126.2 kg)  Height: 5\' 2"  (1.575 m)   Physical Exam Constitutional:      General: She is not in acute distress.    Appearance: She is obese. She is not ill-appearing.  Cardiovascular:     Rate and Rhythm: Normal rate and regular rhythm.     Pulses: Normal pulses.     Heart sounds: Normal heart sounds.  Pulmonary:     Effort: Pulmonary effort is normal.     Breath sounds: Normal breath sounds.  Abdominal:     General: Abdomen is flat. Bowel sounds are normal.     Palpations: Abdomen is soft.     Tenderness: There is no abdominal tenderness.  Musculoskeletal:        General: Normal range of motion.     Comments: No visible deformity, erythema or swelling of the left knee. No valgus/varus deformity. Crepitus of the left knee with passive range of motion. 5/5 strength with knee extension and flexion.  Neurological:     Mental Status: She is alert.    Assessment & Plan:   See Encounters Tab for problem based charting.  PROCEDURE NOTE  PROCEDURE: Left knee joint steroid injection.  PREOPERATIVE DIAGNOSIS: Osteoarthritis of the Left knee.  POSTOPERATIVE DIAGNOSIS: Osteoarthritis of the Left knee.  PROCEDURE: The patient was apprised of the risks  and the benefits of the procedure and informed consent was obtained, as witnessed by Dr. . Time-out procedure was performed, with confirmation of the patient's name, date of birth, and correct identification of the left knee to be injected. The patient's knee was then marked at the appropriate site for injection placement. The knee was sterilely prepped with Betadine. A 40 mg (1 milliliter) solution of Kenalog was drawn up into a 5 mL syringe with a 2 mL of 1% lidocaine. The patient was injected with a 25-gauge needle at the superior-medial aspect of her left flexed knee. There were no complications. The patient tolerated the procedure well. There was minimal bleeding. The patient was instructed to ice her knee upon leaving clinic and refrain from overuse over the next 3 days. The patient was instructed to go to the emergency room with any usual pain, swelling, or redness occurred in the injected area. The patient was given a followup appointment to evaluate response to the injection to his increased range of motion and reduction of pain.  The procedure was supervised by attending physician, Dr. Carlynn Purl.  Patient seen with Dr. Carlynn Purl

## 2020-07-26 NOTE — Patient Instructions (Addendum)
Tammie Solis,  It was a pleasure seeing you again in clinic today.  For your hypertension (high blood pressure): I have refilled your lisinopril-HCTZ 10-12.5mg . Please continue taking this medication daily. I also would like to collect some additional labs (BMP, Lipid Profile and TSH) to ensure that there are no other conditions that we should be managing.  For your knee pain: We have done the steroid injection today. Please stop taking naproxen and using voltaren gel if they are not helping. I also recommend that you take acetaminophen (Tylenol) two to three times daily for your pain.  Sincerely, Dr. Jasmine December, MD

## 2020-07-26 NOTE — Assessment & Plan Note (Signed)
Patient's blood pressure much improved from prior at today's visit (139/67). Patient reports adherence to her prescribed lisinopril-HCTZ 10-12.5mg  daily. Patient hopeful to be able to discontinue this medication, however she understands and agrees with the need for continuation. -Continue lisinopril-HCTZ 10-12.5mg  daily -BMP today -Lipid profile

## 2020-07-27 LAB — BMP8+ANION GAP
Anion Gap: 12 mmol/L (ref 10.0–18.0)
BUN/Creatinine Ratio: 19 (ref 9–23)
BUN: 17 mg/dL (ref 6–24)
CO2: 28 mmol/L (ref 20–29)
Calcium: 9.9 mg/dL (ref 8.7–10.2)
Chloride: 102 mmol/L (ref 96–106)
Creatinine, Ser: 0.88 mg/dL (ref 0.57–1.00)
GFR calc Af Amer: 85 mL/min/{1.73_m2} (ref >59)
GFR calc non Af Amer: 74 mL/min/{1.73_m2} (ref >59)
Glucose: 85 mg/dL (ref 65–99)
Potassium: 4 mmol/L (ref 3.5–5.2)
Sodium: 142 mmol/L (ref 134–144)

## 2020-07-27 LAB — LIPID PANEL
Chol/HDL Ratio: 4.2 ratio (ref 0.0–4.4)
Cholesterol, Total: 183 mg/dL (ref 100–199)
HDL: 44 mg/dL (ref >39)
LDL Chol Calc (NIH): 119 mg/dL — ABNORMAL HIGH (ref 0–99)
Triglycerides: 111 mg/dL (ref 0–149)
VLDL Cholesterol Cal: 20 mg/dL (ref 5–40)

## 2020-07-27 LAB — TSH: TSH: 1.18 u[IU]/mL (ref 0.450–4.500)

## 2020-07-29 NOTE — Progress Notes (Signed)
Internal Medicine Clinic Attending  I saw and evaluated the patient.  I personally confirmed the key portions of the history and exam documented by Dr. Johnson and I reviewed pertinent patient test results.  The assessment, diagnosis, and plan were formulated together and I agree with the documentation in the resident's note.  I was present for the entire procedure.  

## 2020-07-30 DIAGNOSIS — E785 Hyperlipidemia, unspecified: Secondary | ICD-10-CM | POA: Insufficient documentation

## 2020-07-30 MED ORDER — ROSUVASTATIN CALCIUM 5 MG PO TABS: 5.0000 mg | ORAL_TABLET | Freq: Every day | ORAL | 11 refills | Status: DC

## 2020-07-30 NOTE — Addendum Note (Signed)
Addended by: Jasmine December on: 07/30/2020 10:19 AM   Modules accepted: Orders

## 2020-07-30 NOTE — Assessment & Plan Note (Signed)
Lipid Profile: ASCVD of 7.8%. Recommendation for moderate to high intensity statin. -Start rosuvastatin 5mg  daily

## 2020-07-31 NOTE — Progress Notes (Signed)
   Covid-19 Vaccination Clinic  Name:  Rosario Duey    MRN: [id number] DOB: 07/21/1965  07/31/2020  Ms. Ginther was observed post Covid-19 immunization for 15 minutes without incident. She was provided with Vaccine Information Sheet and instruction to access the V-Safe system.   Ms. Stegenga was instructed to call 911 with any severe reactions post vaccine: Marland Kitchen Difficulty breathing  . Swelling of face and throat  . A fast heartbeat  . A bad rash all over body  . Dizziness and weakness   Immunizations Administered    Name Date Dose VIS Date Route   Moderna Covid-19 Booster Vaccine 05/02/2020  2:45 PM 0.25 mL 04/10/2020 Intramuscular   Manufacturer: Moderna   Lot: [id number]   NDC: 98338-250-53

## 2020-09-17 ENCOUNTER — Ambulatory Visit: Payer: BC Managed Care – PPO | Admitting: Internal Medicine

## 2020-09-17 ENCOUNTER — Encounter: Payer: Self-pay | Admitting: Internal Medicine

## 2020-09-17 DIAGNOSIS — M1712 Unilateral primary osteoarthritis, left knee: Secondary | ICD-10-CM | POA: Diagnosis not present

## 2020-09-17 MED ORDER — DICLOFENAC SODIUM 50 MG PO TBEC: 50.0000 mg | DELAYED_RELEASE_TABLET | Freq: Two times a day (BID) | ORAL | 2 refills | Status: AC

## 2020-09-17 NOTE — Patient Instructions (Addendum)
Ms. Schussler,   It was a pleasure meeting you today! Today we talked about your knee pain, we will try Voltaren in pill form. It is a medication to take twice a day as needed. For your allergies, please try Zyrtec and Flonase nasal spray. You can get both over the counter. Have a good day, and we will have you follow in a month's time to reassess your knee pain.  Have a good day,  Dolan Amen, MD

## 2020-09-18 NOTE — Assessment & Plan Note (Signed)
Patient presents back to the clinic for her knee pain, which she states has not had any improvement since her last visit. She has tried tylenol, ibuprofen, naproxen lidocaine cream, icy hot, Voltaren gel, and a joint injection at her last visit on 07/26/2020 did not provide any relief. She states that she knows that she needs to lose weight for knee pain, but is still looking for relief in the short term as she works on diet.   She is hesitant to undergo surgical evaluation for her knees.   A/P:  Patient presents to the clinic today for F/U on her knee pain. Patient continues to have pain after undergoing multiple modalities for pain modification. Discussed continued work on lifestyle modification. She states that she is working on it, but her pain was especially severe while she was at a family member's birthday party this past weekend.   We discussed surgical intervention, which was declined. Additionally discussed orthopedic referral for injections, which she also declined. Ultimately, she may need surgical intervention. Will trial Voltaren tabs PRN at this time and have her follow back in 4 weeks.  - Voltaren 50 mg twice daily, if pain is resistant may need referral for injections  - Follow up in 4 weeks

## 2020-09-18 NOTE — Progress Notes (Signed)
   CC: Knee Pain   HPI:  [name] is a 55 y.o. M/F, with a PMH noted below, who presents to the clinic for follow up on knee pain. To see the management of their acute and chronic conditions, please see the A&P note under the Encounters tab.   Past Medical History:  Diagnosis Date  . Allergy   . Anemia   . Arthritis    knees   . Asthma   . GERD (gastroesophageal reflux disease)    occasionally   . Neuromuscular disorder (HCC)    sciatica left leg   Review of Systems:   Review of Systems  Constitutional: Negative for chills, fever and weight loss.  Gastrointestinal: Negative for abdominal pain, constipation, diarrhea, nausea and vomiting.  Musculoskeletal: Positive for back pain and joint pain. Negative for falls, myalgias and neck pain.  Neurological: Negative for dizziness, tingling, tremors, sensory change and headaches.     Physical Exam:  Vitals:   09/17/20 1041 09/17/20 1115  BP: (!) 169/72 (!) 166/53  Pulse: 81 71  Temp: 98.4 F (36.9 C)   TempSrc: Oral   SpO2: 99%   Weight: 280 lb 14.4 oz (127.4 kg)   Height: 5\' 2"  (1.575 m)    Physical Exam Vitals reviewed.  Constitutional:      General: She is not in acute distress.    Appearance: She is obese. She is not ill-appearing, toxic-appearing or diaphoretic.  HENT:     Head: Normocephalic and atraumatic.  Cardiovascular:     Rate and Rhythm: Normal rate and regular rhythm.     Pulses: Normal pulses.     Heart sounds: Normal heart sounds. No murmur heard. No friction rub. No gallop.   Pulmonary:     Effort: Pulmonary effort is normal.     Breath sounds: Normal breath sounds. No wheezing, rhonchi or rales.  Musculoskeletal:        General: No swelling, tenderness, deformity or signs of injury.     Right lower leg: No edema.     Left lower leg: No edema.      Assessment & Plan:   See Encounters Tab for problem based charting.  Patient discussed with Dr. 

## 2020-09-19 ENCOUNTER — Telehealth: Payer: Self-pay

## 2020-09-19 NOTE — Telephone Encounter (Signed)
Letter signed by MD and taken to front desk.  Pt aware.Kingsley Spittle Cassady3/31/20221:55 PM

## 2020-09-19 NOTE — Telephone Encounter (Signed)
Pt states she was seen in office on 09/17/20-leg/knee pain States she dicussed with MD she was having some "sinus problems"  C/o runny nose, watery eyes, itchy throat, thinks she had a low grade fever yesterday.    states MD recommended zyrtec and flonase.  She just started this yesterday, but feels like she needs to give it a few days to work.   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

## 2020-09-19 NOTE — Telephone Encounter (Signed)
Pls contact pt regarding getting a return to work note [phone]

## 2020-09-19 NOTE — Telephone Encounter (Signed)
Note routed to imp refill, will need signature.

## 2020-09-20 ENCOUNTER — Encounter: Payer: Self-pay | Admitting: Student

## 2020-09-21 NOTE — Progress Notes (Signed)
Internal Medicine Clinic Attending ? ?Case discussed with Dr. Winters  At the time of the visit.  We reviewed the resident?s history and exam and pertinent patient test results.  I agree with the assessment, diagnosis, and plan of care documented in the resident?s note.  ?

## 2020-09-24 ENCOUNTER — Ambulatory Visit: Payer: BC Managed Care – PPO | Admitting: Dietician

## 2020-09-24 ENCOUNTER — Encounter: Payer: Self-pay | Admitting: Dietician

## 2020-09-24 DIAGNOSIS — Z713 Dietary counseling and surveillance: Secondary | ICD-10-CM | POA: Diagnosis not present

## 2020-09-24 NOTE — Progress Notes (Signed)
Medical Nutrition Therapy:  Appt start time: 1040 end time:  1130. Total time: 50 Visit # 1  Assessment:  Primary concerns today: weight loss. Tammie Solis says she has significant knee pain that started with one knee and now  in both knees that she hopes will improve with weight loss. She has lost weight before using diet pills 'lipsome'. She states that her weight has increased with her decrease in activity over the years due to progressive knee pain. Note that she gained 23# over 3 years from 2016 to 2019 and then another 44 # in the past 3 years. She states that she eats fast food more often lately and realizes that this is a problem. This is also due to knee pain and feels this has also contributed to her weight gain. She rated her readiness to change as a "5"/10 and we discussed the negative impact of setting goals and not being ready to change.  She wanted to try setting a goal and following up. Preferred Learning Style: No preference indicated  Learning Readiness: Contemplating  ANTHROPOMETRICS: Estimated body mass index is 50.9 kg/m as calculated from the following:   Height as of 09/17/20: 5\' 2"  (1.575 m).   Weight as of this encounter: 278 lb 4.8 oz (126.2 kg).  WEIGHT HISTORY: usual is ~ 240# Highest: current Lowest 200s# Wt Readings from Last 10 Encounters:  09/24/20 278 lb 4.8 oz (126.2 kg)  09/17/20 280 lb 14.4 oz (127.4 kg)  07/26/20 278 lb 4.8 oz (126.2 kg)  03/12/20 274 lb 11.2 oz (124.6 kg)  05/02/18 264 lb (119.7 kg)  04/18/18 264 lb (119.7 kg)  03/25/15 241 lb (109.3 kg)    SLEEP:bedtime 9 PM-12 AM to 340 AM, she reports good except when she has knee pain MEDICATIONS/supplemets and herbs: centrum silver 1/day, keto 1500 2 pills /day, skin/hair /nails- 3 pills/day, probiotic 1 pill/day BLOOD SUGAR: DIETARY INTAKE: Usual eating pattern includes 3 meals and 1 snacks per day..  Constipation: denies Dining Out (times/week): 7x/week 24-hr recall:  B ( 915 AM): coffee with  creamer and equal, peanut butter sandwich L ( 12 -1 PM):pot pie or meat lovers pizza x2 slices or   Snack with 3 YO grandson- yogurt and peanuts or sun chips D ( 7 PM): fast food- piology 2 slices or salad with chick fil a chicken and dressing or [employer] leg and 2 thighs and fries or [employer] chicken and pasta(took half home) or Kick back jack chicken ceaser salad & [place] Beverages: water  Usual physical activity: limited by ongoing knee pain Estimated energy needs for weight maintenance ~ 2500 calories/day For weight loss ~ 2100 calories/day aim for 200 calories per day   Progress Towards Goal(s):  In progress.   Nutritional Diagnosis:  NB-1.1 Food and nutrition-related knowledge deficit As related to lack of sufficient dietary counseling for weight loss.  As evidenced by her report and questions.    Intervention:  Nutrition educational about weight loss and goal setting. used motivational interviewing to assist her with goal setting and assessing where she is with readiness to change. Action Goal:  Outcome goal: increased knoweldge about weight loss Coordination of care:   Teaching Method Utilized: Visual, Auditory,Hands on Handouts given during visit include: After visit summary Barriers to learning/adherence to lifestyle change: competing values Demonstrated degree of understanding via:  Teach Back   Monitoring/Evaluation:  Dietary intake, exercise, food records, and body weight in 2 week(s). 2101, RD 09/24/2020 2:31 PM.

## 2020-09-24 NOTE — Patient Instructions (Addendum)
Thank you for your visit today!  We talked about:   Weight loss aiming for behavior change in food/diet and or activity.   I suggest focusing on the food/diet with your hurting knee. You can do non- weight bearing activity such as yoga or pilates- any type of activity where you  You need about 2500 calories per day to maintain your current weight You need about 2100-2200 calorie per day to decrease your weight.  I recommend aiming of about 2000 calories per day to start.    Goal to work on:   In the next 2 weeks I will start writing down what I eat and the times I eat for each day of that two weeks and bring this back with me to our next visit.   Please feel free to call me anytime.  Tammie Solis [phone]

## 2020-10-08 ENCOUNTER — Ambulatory Visit: Payer: BC Managed Care – PPO | Admitting: Dietician

## 2020-11-24 ENCOUNTER — Encounter: Payer: Self-pay | Admitting: *Deleted

## 2020-12-02 ENCOUNTER — Ambulatory Visit: Payer: BC Managed Care – PPO | Admitting: Student

## 2020-12-02 ENCOUNTER — Encounter: Payer: Self-pay | Admitting: Student

## 2020-12-02 VITALS — BP 182/64 | HR 81 | Temp 98.5°F | Ht 62.0 in | Wt 276.5 lb

## 2020-12-02 DIAGNOSIS — M1612 Unilateral primary osteoarthritis, left hip: Secondary | ICD-10-CM

## 2020-12-02 DIAGNOSIS — M1712 Unilateral primary osteoarthritis, left knee: Secondary | ICD-10-CM

## 2020-12-02 DIAGNOSIS — I10 Essential (primary) hypertension: Secondary | ICD-10-CM

## 2020-12-02 MED ORDER — SEMAGLUTIDE(0.25 OR 0.5MG/DOS) 2 MG/1.5ML ~~LOC~~ SOPN: 0.2500 mg | PEN_INJECTOR | SUBCUTANEOUS | 0 refills | Status: DC

## 2020-12-02 MED ORDER — LISINOPRIL-HYDROCHLOROTHIAZIDE 20-12.5 MG PO TABS: 1.0000 | ORAL_TABLET | Freq: Every day | ORAL | 2 refills | Status: DC

## 2020-12-02 NOTE — Assessment & Plan Note (Signed)
Patient presents to clinic with severe elevation in blood pressure to 182/64 although asymptomatic. Patient attributes blood pressure to rushing to get to clinic as quickly as possible and pain. She states that she took her antihypertensive this morning -Increase to lisinopril-HCTZ 20-12.5mg  daily -Continue to assess adherence and escalate as tolerated

## 2020-12-02 NOTE — Progress Notes (Signed)
   CC: Left knee pain  HPI:  Ms.Akina Rylander is a 55 y.o. 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 Refer to problem list for charting of this encounter.  Past Medical History:  Diagnosis Date   Allergy    Anemia    Arthritis    knees    Asthma    GERD (gastroesophageal reflux disease)    occasionally    Neuromuscular disorder (HCC)    sciatica left leg   Review of Systems:  Endorses left hip pain, left knee pain, and mild right knee pain. Denies fevers, chills, swelling, redness, falls.  Physical Exam:  Vitals:   12/02/20 1602 12/02/20 1626  BP: (!) 193/63 (!) 182/64  Pulse: 96 81  Temp: 98.5 F (36.9 C)   TempSrc: Oral   SpO2: 100%   Weight: 276 lb 8 oz (125.4 kg)   Height: 5\' 2"  (1.575 m)    Physical Exam Constitutional:      General: She is not in acute distress.    Appearance: She is obese. She is not ill-appearing.  Cardiovascular:     Rate and Rhythm: Normal rate and regular rhythm.     Pulses: Normal pulses.     Heart sounds: Normal heart sounds.  Pulmonary:     Effort: Pulmonary effort is normal.     Breath sounds: Normal breath sounds.  Abdominal:     General: Abdomen is flat. Bowel sounds are normal.     Palpations: Abdomen is soft.     Tenderness: There is no abdominal tenderness.  Musculoskeletal:     Comments: Full ROM of left knee and left hip. No appreciable swelling although body habitus limits assessment. No gross deformity. No tenderness to palpation of knee or hip. Palpable crepitus overlying anterior knee with flexion and extension  Skin:    General: Skin is warm and dry.     Findings: No erythema.     Comments: No erythema of left knee  Neurological:     Comments: 5/5 strength of hip and knee flexion, extension, internal and external rotation, hip abduction and adduction   Assessment & Plan:   See Encounters Tab  for problem based charting.  Patient discussed with Dr.  

## 2020-12-02 NOTE — Patient Instructions (Addendum)
Tammie Solis,  It was a pleasure seeing you in clinic today.  For your knee pain, we recommend that you get imaging of the left hip and left knee. You will be called to schedule this appointment, please make sure to get these imaging studies completed.   Another important factor causing your pain is your weight as we discussed. We do have a few recommendations to help with weight. -Contact Tammie Solis to continue working on diet modifications -Start semaglutide (Tammie Solis) 0.25mg  injection weekly  For your hypertension, your blood pressure is dangerously high. We will need to increase the dose of your blood pressure medication. -Please start taking lisinopril-HCTZ 20-12.5mg  daily  Sincerely, Dr. Jasmine December, MD  [email or web address]">  DASH Eating Plan DASH stands for Dietary Approaches to Stop Hypertension. The DASH eating plan is a healthy eating plan that has been shown to: Reduce high blood pressure (hypertension). Reduce your risk for type 2 diabetes, heart disease, and stroke. Help with weight loss. What are tips for following this plan? Reading food labels Check food labels for the amount of salt (sodium) per serving. Choose foods with less than 5 percent of the Daily Value of sodium. Generally, foods with less than 300 milligrams (mg) of sodium per serving fit into this eating plan. To find whole grains, look for the word "whole" as the first word in the ingredient list. Shopping Buy products labeled as "low-sodium" or "no salt added." Buy fresh foods. Avoid canned foods and pre-made or frozen meals. Cooking Avoid adding salt when cooking. Use salt-free seasonings or herbs instead of table salt or sea salt. Check with your health care provider or pharmacist before using salt substitutes. Do not fry foods. Cook foods using healthy methods such as baking, boiling, grilling, roasting, and broiling instead. Cook with heart-healthy  oils, such as olive, canola, avocado, soybean, or sunflower oil. Meal planning  Eat a balanced diet that includes: 4 or more servings of fruits and 4 or more servings of vegetables each day. Try to fill one-half of your plate with fruits and vegetables. 6-8 servings of whole grains each day. Less than 6 oz (170 g) of lean meat, poultry, or fish each day. A 3-oz (85-g) serving of meat is about the same size as a deck of cards. One egg equals 1 oz (28 g). 2-3 servings of low-fat dairy each day. One serving is 1 cup (237 mL). 1 serving of nuts, seeds, or beans 5 times each week. 2-3 servings of heart-healthy fats. Healthy fats called omega-3 fatty acids are found in foods such as walnuts, flaxseeds, fortified milks, and eggs. These fats are also found in cold-water fish, such as sardines, salmon, and mackerel. Limit how much you eat of: Canned or prepackaged foods. Food that is high in trans fat, such as some fried foods. Food that is high in saturated fat, such as fatty meat. Desserts and other sweets, sugary drinks, and other foods with added sugar. Full-fat dairy products. Do not salt foods before eating. Do not eat more than 4 egg yolks a week. Try to eat at least 2 vegetarian meals a week. Eat more home-cooked food and less restaurant, buffet, and fast food.  Lifestyle When eating at a restaurant, ask that your food be prepared with less salt or no salt, if possible. If you drink alcohol: Limit how much you use to: 0-1 drink a day for women who are not pregnant. 0-2 drinks a day for men. Be aware of how much alcohol is  in your drink. In the [place] one drink equals one 12 oz bottle of beer (355 mL), one 5 oz glass of wine (148 mL), or one 1 oz glass of hard liquor (44 mL). General information Avoid eating more than 2,300 mg of salt a day. If you have hypertension, you may need to reduce your sodium intake to 1,500 mg a day. Work with your health care provider to maintain a healthy body  weight or to lose weight. Ask what an ideal weight is for you. Get at least 30 minutes of exercise that causes your heart to beat faster (aerobic exercise) most days of the week. Activities may include walking, swimming, or biking. Work with your health care provider or dietitian to adjust your eating plan to your individual calorie needs. What foods should I eat? Fruits All fresh, dried, or frozen fruit. Canned fruit in natural juice (without addedsugar). Vegetables Fresh or frozen vegetables (raw, steamed, roasted, or grilled). Low-sodium or reduced-sodium tomato and vegetable juice. Low-sodium or reduced-sodium tomatosauce and tomato paste. Low-sodium or reduced-sodium canned vegetables. Grains Whole-grain or whole-wheat bread. Whole-grain or whole-wheat pasta. Brown rice. Tammie Solis. Bulgur. Whole-grain and low-sodium cereals. Pita bread.Low-fat, low-sodium crackers. Whole-wheat flour tortillas. Meats and other proteins Skinless chicken or Tammie Solis. Ground chicken or Tammie Solis. Pork with fat trimmed off. Fish and seafood. Egg whites. Dried beans, peas, or lentils. Unsalted nuts, nut butters, and seeds. Unsalted canned beans. Lean cuts of beef with fat trimmed off. Low-sodium, lean precooked or cured meat, such as sausages or meatloaves. Dairy Low-fat (1%) or fat-free (skim) milk. Reduced-fat, low-fat, or fat-free cheeses. Nonfat, low-sodium ricotta or cottage cheese. Low-fat or nonfatyogurt. Low-fat, low-sodium cheese. Fats and oils Soft margarine without trans fats. Vegetable oil. Reduced-fat, low-fat, or light mayonnaise and salad dressings (reduced-sodium). Canola, safflower, olive, avocado, soybean, andsunflower oils. Avocado. Seasonings and condiments Herbs. Spices. Seasoning mixes without salt. Other foods Unsalted popcorn and pretzels. Fat-free sweets. The items listed above may not be a complete list of foods and beverages you can eat. Contact a dietitian for more information. What  foods should I avoid? Fruits Canned fruit in a light or heavy syrup. Fried fruit. Fruit in cream or buttersauce. Vegetables Creamed or fried vegetables. Vegetables in a cheese sauce. Regular canned vegetables (not low-sodium or reduced-sodium). Regular canned tomato sauce and paste (not low-sodium or reduced-sodium). Regular tomato and vegetable juice(not low-sodium or reduced-sodium). Rosita Fire. Olives. Grains Baked goods made with fat, such as croissants, muffins, or some breads. Drypasta or rice meal packs. Meats and other proteins Fatty cuts of meat. Ribs. Fried meat. Tomasa Blase. Bologna, salami, and other precooked or cured meats, such as sausages or meat loaves. Fat from the back of a pig (fatback). Bratwurst. Salted nuts and seeds. Canned beans with added salt. Canned orsmoked fish. Whole eggs or egg yolks. Chicken or Tammie Solis with skin. Dairy Whole or 2% milk, cream, and half-and-half. Whole or full-fat cream cheese. Whole-fat or sweetened yogurt. Full-fat cheese. Nondairy creamers. Whippedtoppings. Processed cheese and cheese spreads. Fats and oils Butter. Stick margarine. Lard. Shortening. Ghee. Bacon fat. Tropical oils, suchas coconut, palm kernel, or palm oil. Seasonings and condiments Onion salt, garlic salt, seasoned salt, table salt, and sea salt. Worcestershire sauce. Tartar sauce. Barbecue sauce. Teriyaki sauce. Soy sauce, including reduced-sodium. Steak sauce. Canned and packaged gravies. Fish sauce. Oyster sauce. Cocktail sauce. Store-bought horseradish. Ketchup. Mustard. Meat flavorings and tenderizers. Bouillon cubes. Hot sauces. Pre-made or packaged marinades. Pre-made or packaged taco seasonings. Relishes. Regular saladdressings. Other foods Salted  popcorn and pretzels. The items listed above may not be a complete list of foods and beverages you should avoid. Contact a dietitian for more information. Where to find more information National Heart, Lung, and Blood Institute:  PopSteam.is American Heart Association: www.heart.org Academy of Nutrition and Dietetics: www.eatright.org National Kidney Foundation: www.kidney.org Summary The DASH eating plan is a healthy eating plan that has been shown to reduce high blood pressure (hypertension). It may also reduce your risk for type 2 diabetes, heart disease, and stroke. When on the DASH eating plan, aim to eat more fresh fruits and vegetables, whole grains, lean proteins, low-fat dairy, and heart-healthy fats. With the DASH eating plan, you should limit salt (sodium) intake to 2,300 mg a day. If you have hypertension, you may need to reduce your sodium intake to 1,500 mg a day. Work with your health care provider or dietitian to adjust your eating plan to your individual calorie needs. This information is not intended to replace advice given to you by your health care provider. Make sure you discuss any questions you have with your healthcare provider. Document Revised: 05/12/2019 Document Reviewed: 05/12/2019 Elsevier Patient Education  2022 [employer].

## 2020-12-02 NOTE — Assessment & Plan Note (Addendum)
Patient continues to endorse ongoing left knee pain. Patient states that pain began several years ago and is located in her anterior and superior region of her left knee although exact location is somewhat vague and generalized. Pain is nonradiating. No specific triggers although standing and walking worsens the pain. No specific alleviating factors despite physical therapy, lidocaine, acetaminophen, naproxen, voltaren gel, voltaren tablets and steroid injections. Patient is currently only using voltaren tablets without relief. -Discussed need for weight reduction, encouraged patient to continue following with [name] -Start semaglutide at 0.25mg  weekly with plan to titrate up at 4 weeks to assist with weight reduction -Radiograph of left hip -Radiograph of left knee -Will consider referral to orthopedic surgeon with appropriate weight loss -Acetaminophen 650mg  two tablets in morning, two tablets in evening

## 2020-12-03 NOTE — Progress Notes (Signed)
Internal Medicine Clinic Attending  Case discussed with Dr. Johnson  At the time of the visit.  We reviewed the resident's history and exam and pertinent patient test results.  I agree with the assessment, diagnosis, and plan of care documented in the resident's note.  

## 2020-12-04 ENCOUNTER — Telehealth: Payer: Self-pay

## 2020-12-04 NOTE — Telephone Encounter (Signed)
Pt states she cannot afford Semaglutide,0.25 or 0.5MG /DOS, 2 MG/1.5ML SOPN. Please call pt back.

## 2020-12-04 NOTE — Telephone Encounter (Signed)
Called pt who stated Semaglutide will cost her $935.00 (her pharmacy is [facility]). Too expensive.

## 2020-12-04 NOTE — Telephone Encounter (Signed)
Thank you :)

## 2020-12-04 NOTE — Telephone Encounter (Signed)
Thanks [employer]. I assumed that this patient may have trouble accessing a GLP1 agonist as the medication is being prescribed solely for weight loss assistance as she has no history of prediabetes or diabetes.

## 2020-12-05 NOTE — Telephone Encounter (Signed)
That is fantastic news, thanks [place].

## 2020-12-24 ENCOUNTER — Encounter: Payer: Self-pay | Admitting: *Deleted

## 2020-12-31 ENCOUNTER — Encounter: Payer: Self-pay | Admitting: Internal Medicine

## 2021-01-01 ENCOUNTER — Ambulatory Visit (HOSPITAL_COMMUNITY)
Admission: RE | Admit: 2021-01-01 | Discharge: 2021-01-01 | Disposition: A | Discharge status: Home / Self Care | Payer: BC Managed Care – PPO | Source: Ambulatory Visit | Attending: Internal Medicine | Admitting: Internal Medicine

## 2021-01-01 ENCOUNTER — Other Ambulatory Visit: Payer: Self-pay | Admitting: Student

## 2021-01-01 ENCOUNTER — Other Ambulatory Visit: Payer: Self-pay

## 2021-01-01 DIAGNOSIS — M1712 Unilateral primary osteoarthritis, left knee: Secondary | ICD-10-CM | POA: Diagnosis present

## 2021-01-01 DIAGNOSIS — M1612 Unilateral primary osteoarthritis, left hip: Secondary | ICD-10-CM

## 2021-01-09 ENCOUNTER — Ambulatory Visit: Payer: BC Managed Care – PPO | Admitting: Internal Medicine

## 2021-01-09 ENCOUNTER — Encounter: Payer: Self-pay | Admitting: Internal Medicine

## 2021-01-09 DIAGNOSIS — I10 Essential (primary) hypertension: Secondary | ICD-10-CM | POA: Diagnosis not present

## 2021-01-09 DIAGNOSIS — E7849 Other hyperlipidemia: Secondary | ICD-10-CM | POA: Diagnosis not present

## 2021-01-09 DIAGNOSIS — M1712 Unilateral primary osteoarthritis, left knee: Secondary | ICD-10-CM

## 2021-01-09 DIAGNOSIS — E7841 Elevated Lipoprotein(a): Secondary | ICD-10-CM | POA: Diagnosis not present

## 2021-01-09 DIAGNOSIS — Z0000 Encounter for general adult medical examination without abnormal findings: Secondary | ICD-10-CM

## 2021-01-09 DIAGNOSIS — Z23 Encounter for immunization: Secondary | ICD-10-CM | POA: Diagnosis not present

## 2021-01-09 MED ORDER — ZOSTER VAC RECOMB ADJUVANTED 50 MCG/0.5ML IM SUSR: 0.5000 mL | Freq: Once | INTRAMUSCULAR | 0 refills | Status: AC

## 2021-01-09 MED ORDER — AMLODIPINE BESYLATE 5 MG PO TABS: 5.0000 mg | ORAL_TABLET | Freq: Every day | ORAL | 11 refills | Status: DC

## 2021-01-09 MED ORDER — ROSUVASTATIN CALCIUM 20 MG PO TABS: 20.0000 mg | ORAL_TABLET | Freq: Every day | ORAL | 11 refills | Status: DC

## 2021-01-09 MED ORDER — SEMAGLUTIDE(0.25 OR 0.5MG/DOS) 2 MG/1.5ML ~~LOC~~ SOPN: 0.2500 mg | PEN_INJECTOR | SUBCUTANEOUS | 0 refills | Status: DC

## 2021-01-09 MED ORDER — SEMAGLUTIDE(0.25 OR 0.5MG/DOS) 2 MG/1.5ML ~~LOC~~ SOPN: 0.5000 mg | PEN_INJECTOR | SUBCUTANEOUS | 0 refills | Status: DC

## 2021-01-09 NOTE — Assessment & Plan Note (Addendum)
Patients presents to clinic with elevated BP of 148/63.  She states that this weekend her BP was 161/101 and she had a headache.  She does not regularly check her BP.  She takes Zestoretic 20-12.5 mg for her HTN and states she takes her medications -Continue Zestoretic and add Amlodipine 5 mg, asked patient to start a BP log -Will get BMP at visit in 6 weeks.

## 2021-01-09 NOTE — Assessment & Plan Note (Signed)
Patient continues to have left knee pain.  The pain began several years ago and is located at the back of her knee.  It is worst in the morning and feels "stiff" if she doesn't move for awhile. Activity is limited due to her knee pain. She takes Tylenol, Naproxen, uses voltaren gel, voltaren tablets and has had steroid injections in the past without relief.  She was started on semaglutide in June/2022 and has seen a 6 lbs weight loss since then. -titrate semaglutide to 0.5mg  -f/u in 6 weeks to continue increasing dosage if patient continues to tolerate -encouraged exercising in pool when possible to help increase activity

## 2021-01-09 NOTE — Assessment & Plan Note (Signed)
Lipid profile 02/22: ASCVD of 7.8%. Recommendation for moderate to high intensity statin.  She has tolerated Crestor 5 mg.  Patient has multiple risk factors including obesity. -started Crestor 20 mg  -recheck lipid panel at next visit in 6 weeks.

## 2021-01-09 NOTE — Progress Notes (Signed)
   CC: Chronic left knee pain  HPI:  [name] is a 55 y.o. with medical history as below presenting to [facility] for chronic left knee pain.   Please see problem-based list for further details, assessments, and plans.  Past Medical History:  Diagnosis Date   Allergy    Anemia    Arthritis    knees    Asthma    GERD (gastroesophageal reflux disease)    occasionally    Neuromuscular disorder (HCC)    sciatica left leg   Review of Systems:  As per HPI  Physical Exam:  There were no vitals filed for this visit.  General: well-developed, obese woman HENT: NCAT, no scares Eyes: no scleral icterus, conjunctiva clear CV: normal rate, no murmurs Pulm: CTAB, normal pulmonary effort GI: no tenderness, normal bowel sounds MSK: limited range of motion in left leg in flexion and extension, antalgic gait Skin: no rashes or bruises Psych: normal mood and affect  Assessment & Plan:   See Encounters Tab for problem based charting.  Patient seen with Dr. Antony Solis

## 2021-01-09 NOTE — Patient Instructions (Signed)
[name], it was a pleasure seeing you today!  Today we discussed:  Weight loss goals- we will continue to increase your semaglutide to help with weight loss.  Please continue exercise as you are able and decrease the amount you eat fast food.  High Cholesterol- We are increasing your Crestor to 20mg  and will repeat blood work to check cholesterol at your next visit.  High blood pressure- We are adding a new blood pressure medication today called Norvasc (amlodipine).  Try to check your blood pressure once daily over the next 6 weeks and bring that log in with you at your next visit.   Shingrex- I printed out an order for you for this vaccination to prevent shingles.  Take this to your pharmacy and they will give you that vaccine.  Tetanus- you received your tetanus shot today.  This is good for the next ten years.   I have ordered the following labs today:  Lab Orders  No laboratory test(s) ordered today     Tests ordered today:  None  Referrals ordered today:   Referral Orders  No referral(s) requested today     I have ordered the following medication/changed the following medications:   Stop the following medications: Medications Discontinued During This Encounter  Medication Reason   Semaglutide,0.25 or 0.5MG /DOS, 2 MG/1.5ML SOPN Reorder   Semaglutide,0.25 or 0.5MG /DOS, 2 MG/1.5ML SOPN    rosuvastatin (CRESTOR) 5 MG tablet Change in therapy     Start the following medications: Meds ordered this encounter  Medications   DISCONTD: Semaglutide,0.25 or 0.5MG /DOS, 2 MG/1.5ML SOPN    Sig: Inject 0.25 mg into the skin once a week.    Dispense:  1.5 mL    Refill:  0   Semaglutide,0.25 or 0.5MG /DOS, 2 MG/1.5ML SOPN    Sig: Inject 0.5 mg into the skin once a week.    Dispense:  1.5 mL    Refill:  0   rosuvastatin (CRESTOR) 20 MG tablet    Sig: Take 1 tablet (20 mg total) by mouth daily.    Dispense:  30 tablet    Refill:  11   amLODipine (NORVASC) 5 MG tablet     Sig: Take 1 tablet (5 mg total) by mouth daily.    Dispense:  30 tablet    Refill:  11   Zoster Vaccine Adjuvanted [facility]) injection    Sig: Inject 0.5 mLs into the muscle once for 1 dose.    Dispense:  0.5 mL    Refill:  0     Follow-up:  6 weeks    Please make sure to arrive 15 minutes prior to your next appointment. If you arrive late, you may be asked to reschedule.   We look forward to seeing you next time. Please call our clinic at [phone] if you have any questions or concerns. The best time to call is Monday-Friday from 9am-4pm, but there is someone available 24/7. If after hours or the weekend, call the main hospital number and ask for the Internal Medicine Resident On-Call. If you need medication refills, please notify your pharmacy one week in advance and they will send [phone] a request.  Thank you for letting us take part in your care. Wishing you the best!  Thank you, Dr. [name]

## 2021-01-20 NOTE — Progress Notes (Signed)
Internal Medicine Clinic Attending  I saw and evaluated the patient.  I personally confirmed the key portions of the history and exam documented by Dr. Masters and I reviewed pertinent patient test results.  The assessment, diagnosis, and plan were formulated together and I agree with the documentation in the resident's note.  

## 2021-02-13 ENCOUNTER — Encounter: Payer: Self-pay | Admitting: Internal Medicine

## 2021-02-13 ENCOUNTER — Ambulatory Visit: Payer: BC Managed Care – PPO | Admitting: Internal Medicine

## 2021-02-13 ENCOUNTER — Other Ambulatory Visit: Payer: Self-pay

## 2021-02-13 VITALS — BP 133/55 | HR 75 | Temp 98.7°F | Resp 24 | Ht 62.0 in | Wt 273.0 lb

## 2021-02-13 DIAGNOSIS — M1712 Unilateral primary osteoarthritis, left knee: Secondary | ICD-10-CM

## 2021-02-13 DIAGNOSIS — M79605 Pain in left leg: Secondary | ICD-10-CM | POA: Insufficient documentation

## 2021-02-13 DIAGNOSIS — Z6841 Body Mass Index (BMI) 40.0 and over, adult: Secondary | ICD-10-CM | POA: Diagnosis not present

## 2021-02-13 MED ORDER — SEMAGLUTIDE(0.25 OR 0.5MG/DOS) 2 MG/1.5ML ~~LOC~~ SOPN: 0.5000 mg | PEN_INJECTOR | SUBCUTANEOUS | 0 refills | Status: DC

## 2021-02-13 NOTE — Assessment & Plan Note (Addendum)
Patient with past medical history of OA of left knee presents with increased knee pain over the last few weeks.  She states that she had been able to complete tasks at work prior to onset of increased pain.  She has difficulty sleeping and riding in a car due to this pain.  Pain is located behind her knee.  Ultrasound used in clinic showed compressibility of popliteal artery.  No erythema or effusions present.  She has not taken NSAIDs, but takes tylenol without relief.  Differentials include thrombophlebitis vs gastrocnemius strain vs tendinitis.  Plan: -ordered doppler ultrasound of left knee -Instructed patient to try short course of advil vs motrin.  Take two tablets, three times a day for the next 7 days.   -Patient requested note for work.  She would like follow up appointment next week for FMLA paperwork -follow-up in one to two weeks

## 2021-02-13 NOTE — Patient Instructions (Addendum)
[name], it was a pleasure seeing you today!  Today we discussed: Increased pain in left leg- I ordered an ultrasound that is more detailed to evaluate your leg.  This is a test that will look for blood clots in your leg.  You will get a call to have this set up most likely next week.  Please try taking a short course of advil or motrin.  Take two tablets, three times a day for the next 7 days.   Weight loss- we will increase your semaglutide to 0.5 mg weekly  I have ordered the following labs today:  Lab Orders  No laboratory test(s) ordered today     Tests ordered today:  Doppler ultrasound  Referrals ordered today:   Referral Orders  No referral(s) requested today     I have ordered the following medication/changed the following medications:   Stop the following medications: Medications Discontinued During This Encounter  Medication Reason   Semaglutide,0.25 or 0.5MG /DOS, 2 MG/1.5ML SOPN      Start the following medications: Meds ordered this encounter  Medications   Semaglutide,0.25 or 0.5MG /DOS, 2 MG/1.5ML SOPN    Sig: Inject 0.5 mg into the skin once a week.    Dispense:  1.5 mL    Refill:  0     Follow-up:  1 week    Please make sure to arrive 15 minutes prior to your next appointment. If you arrive late, you may be asked to reschedule.   We look forward to seeing you next time. Please call our clinic at [phone] if you have any questions or concerns. The best time to call is Monday-Friday from 9am-4pm, but there is someone available 24/7. If after hours or the weekend, call the main hospital number and ask for the Internal Medicine Resident On-Call. If you need medication refills, please notify your pharmacy one week in advance and they will send [name] a request.  Thank you for letting us take part in your care. Wishing you the best!  Thank you, Dr. [facility]

## 2021-02-13 NOTE — Progress Notes (Signed)
   CC: follow-up for left knee pain  HPI:  [name] is a 55 y.o. with medical history as below presenting to [facility] for follow-up for left knee pain.  Please see problem-based list for further details, assessments, and plans.  Past Medical History:  Diagnosis Date   Allergy    Anemia    Arthritis    knees    Asthma    GERD (gastroesophageal reflux disease)    occasionally    Neuromuscular disorder (HCC)    sciatica left leg   Physical Exam:  Vitals:   02/13/21 1601 02/13/21 1609  BP: (!) 156/67 (!) 133/55  Pulse: 81 75  Resp: (!) 24   Temp: 98.7 F (37.1 C)   TempSrc: Oral   SpO2: 100%   Weight: 273 lb (123.8 kg)   Height: 5\' 2"  (1.575 m)     General: well-developed, well-nourished, obese  HENT:NCAT, no scars  Eyes:no scleral icterus, conjunctiva clear CV:No m,r,g,  normal rate Pulm:CTAB, pulmonary effort normal GI:no tenderness, bowel sounds present present in popliteal space of left knee. Limited range of motion with left knee flexion and pain with dorsiflexion of foot.  Skin: no erythema or effusions noted of left knee Psych: normal mood and affect  Assessment & Plan:   See Encounters Tab for problem based charting.  Patient seen with Dr. DZH:[id number]

## 2021-02-13 NOTE — Assessment & Plan Note (Signed)
Patient continues to try to lose weight.  Her physical inactivity has decreased more over the last few weeks due to increased pain of left leg.  Increased semaglutide to 0.5 mg. Plan: -increased dose of semaglutide 0.5mg 

## 2021-02-18 NOTE — Progress Notes (Signed)
Internal Medicine Clinic Attending  I saw and evaluated the patient.  I personally confirmed the key portions of the history and exam documented by Dr. Masters and I reviewed pertinent patient test results.  The assessment, diagnosis, and plan were formulated together and I agree with the documentation in the resident's note.  

## 2021-02-19 ENCOUNTER — Ambulatory Visit (HOSPITAL_COMMUNITY)
Admission: RE | Admit: 2021-02-19 | Discharge: 2021-02-19 | Disposition: A | Discharge status: Home / Self Care | Payer: BC Managed Care – PPO | Source: Ambulatory Visit | Attending: Internal Medicine | Admitting: Internal Medicine

## 2021-02-19 ENCOUNTER — Other Ambulatory Visit: Payer: Self-pay

## 2021-02-19 DIAGNOSIS — M79605 Pain in left leg: Secondary | ICD-10-CM | POA: Insufficient documentation

## 2021-02-28 ENCOUNTER — Ambulatory Visit: Payer: BC Managed Care – PPO | Admitting: Internal Medicine

## 2021-02-28 ENCOUNTER — Other Ambulatory Visit: Payer: Self-pay

## 2021-02-28 ENCOUNTER — Encounter: Payer: Self-pay | Admitting: Internal Medicine

## 2021-02-28 VITALS — BP 147/87 | HR 82 | Temp 99.1°F | Resp 24 | Ht 62.0 in | Wt 272.4 lb

## 2021-02-28 DIAGNOSIS — M1712 Unilateral primary osteoarthritis, left knee: Secondary | ICD-10-CM

## 2021-02-28 NOTE — Patient Instructions (Addendum)
Ms. Cramer,   It was a pleasure seeing you again! Today we discussed your knee pain. Since we have tried several different types of therapy for your knee, with little success we will reach out to the sports medicine physicians to see if there are any interventions they can provide. Additionally, congratulations on your weight loss journey, continue the good work! Dolan Amen, MD

## 2021-02-28 NOTE — Progress Notes (Signed)
   CC: Left Knee Pain  HPI:  [name] is a 55 y.o. person, with a PMH noted below, who presents to the clinic for left knee pain. To see the management of their acute and chronic conditions, please see the A&P note under the Encounters tab.   Past Medical History:  Diagnosis Date   Allergy    Anemia    Arthritis    knees    Asthma    GERD (gastroesophageal reflux disease)    occasionally    Neuromuscular disorder (HCC)    sciatica left leg   Review of Systems:   Review of Systems  Constitutional:  Positive for weight loss. Negative for chills, fever and malaise/fatigue.       Intentional weight loss  Cardiovascular:  Negative for chest pain and palpitations.  Gastrointestinal:  Negative for abdominal pain, constipation, diarrhea, nausea and vomiting.  Musculoskeletal:  Positive for joint pain.       Left knee pain    Physical Exam: Vitals:   02/28/21 0959 02/28/21 1007  BP: (!) 163/86 (!) 147/87  Pulse: 88 82  Resp: (!) 24   Temp: 99.1 F (37.3 C)   TempSrc: Oral   SpO2: 100%   Weight: 272 lb 6.4 oz (123.6 kg)   Height: 5\' 2"  (1.575 m)    Physical Exam Constitutional:      General: She is not in acute distress.    Appearance: Normal appearance. She is obese. She is not ill-appearing or toxic-appearing.  Cardiovascular:     Rate and Rhythm: Normal rate and regular rhythm.     Pulses: Normal pulses.     Heart sounds: Normal heart sounds. No murmur heard.   No friction rub. No gallop.  Pulmonary:     Effort: Pulmonary effort is normal. No respiratory distress.     Breath sounds: Normal breath sounds. No stridor. No wheezing or rales.  Musculoskeletal:        General: No swelling or deformity.     Right lower leg: No edema.     Left lower leg: No edema.     Comments: Full range of motion in the left knee limited secondary to pain.  Skin:    General: Skin is warm.     Findings: No erythema or rash.  Neurological:     General: No focal deficit present.      Mental Status: She is alert and oriented to person, place, and time.     Assessment & Plan:   See Encounters Tab for problem based charting.  Patient discussed with Dr. 

## 2021-02-28 NOTE — Assessment & Plan Note (Addendum)
Patient presenting with continued concerns of left knee pain. She has been on multiple medications for controlling her knee pain, and has recently started with a GLP-1 for weight loss, for which she has lost 8 pounds since last time I saw her in March 2022. In the interim, she has tried Tylenol, Advil, naproxen, Voltaren gel and tablets, heating pads, icy hot, Bengay, ibuprofen, and Mobic with no relief of her symptoms.  She has tried steroid injections in the past, which provided little relief. During her last visit she had new posterior knee pain, ultrasound did not show Baker's cyst, and DVT studies were negative. Given that the patient has trialed and failed several medications for osteoarthritis, will reach out to sports medicine for further recommendations, but ultimately, patient's weight loss will continue to relief the symptoms. - Ambulatory referral to sports medicine - Continue conservative treatment - Continue semaglutide 0.5 mg injection once a week

## 2021-03-05 ENCOUNTER — Ambulatory Visit: Payer: BC Managed Care – PPO | Admitting: Family Medicine

## 2021-03-05 ENCOUNTER — Other Ambulatory Visit: Payer: Self-pay

## 2021-03-05 VITALS — Ht 62.0 in | Wt 272.0 lb

## 2021-03-05 DIAGNOSIS — I8002 Phlebitis and thrombophlebitis of superficial vessels of left lower extremity: Secondary | ICD-10-CM | POA: Diagnosis not present

## 2021-03-05 DIAGNOSIS — M25562 Pain in left knee: Secondary | ICD-10-CM

## 2021-03-05 MED ORDER — MELOXICAM 15 MG PO TABS: 15.0000 mg | ORAL_TABLET | Freq: Every day | ORAL | 2 refills | Status: DC

## 2021-03-05 NOTE — Progress Notes (Signed)
PCP: Rudene Christians, DO  Subjective:   HPI: Patient is a 55 y.o. female here for evaluation of left posterior knee pain for about 1 year.  Tammie Solis states that for almost the past year she has had posterior left knee pain.  She denies any inciting event or injury that she can recall.  She has been seen by her primary physician for this and has tried multiple treatment modalities including Tylenol, Voltaren gel, Aleve without much relief.  She also had a cortisone injection earlier in the year that only provided about 2 days of pain relief but did not help significantly.  She did have x-rays performed back in July of this year which showed mild-moderate arthritis of the knee, however these were not performed standing.  She also had a Doppler lower extremity venous study which did not show any evidence of DVT or cystic structure within the popliteal fossa. The patient states she will get sharp pains with flexion of the knee or when ambulating or walking up or down steps. The pain is always in the posterior aspect of the knee.  She denies any anterior knee pain.  She denies any mechanical symptoms of locking or clicking or catching.  At times she will have low back pain, but she denies any sciatic-like pain that starts from the back and shoots into the knee.  She feels like these are separate issues.  She denies any redness, swelling, no fever or chills.   Past Medical History:  Diagnosis Date   Allergy    Anemia    Arthritis    knees    Asthma    GERD (gastroesophageal reflux disease)    occasionally    Neuromuscular disorder (HCC)    sciatica left leg    Current Outpatient Medications on File Prior to Visit  Medication Sig Dispense Refill   acetaminophen (TYLENOL) 650 MG CR tablet Take 1 tablet (650 mg total) by mouth every 8 (eight) hours as needed for pain. 60 tablet 0   amLODipine (NORVASC) 5 MG tablet Take 1 tablet (5 mg total) by mouth daily. 30 tablet 11   DIETARY MANAGEMENT PRODUCT PO  Take 1 each by mouth 2 (two) times daily before a meal.     lisinopril-hydrochlorothiazide (ZESTORETIC) 20-12.5 MG tablet Take 1 tablet by mouth daily. 30 tablet 2   Loratadine 10 MG CAPS Take by mouth.     Multiple Vitamins-Minerals (CENTRUM SILVER 50+WOMEN PO) Take 1 each by mouth daily.     Probiotic Product (RA PROBIOTIC GUMMIES PO) Take by mouth daily.     rosuvastatin (CRESTOR) 20 MG tablet Take 1 tablet (20 mg total) by mouth daily. 30 tablet 11   Semaglutide,0.25 or 0.5MG /DOS, 2 MG/1.5ML SOPN Inject 0.5 mg into the skin once a week. 1.5 mL 0   [DISCONTINUED] albuterol (PROVENTIL HFA;VENTOLIN HFA) 108 (90 Base) MCG/ACT inhaler Inhale 2 puffs into the lungs every 4 (four) hours as needed for wheezing or shortness of breath. (Patient not taking: Reported on 05/02/2018) 1 Inhaler 0   No current facility-administered medications on file prior to visit.    Past Surgical History:  Procedure Laterality Date   CESAREAN SECTION     x2    Allergies  Allergen Reactions   Penicillins     Freezes up my system- gets the shakes and feels cold    Ht 5\' 2"  (1.575 m)   Wt 272 lb (123.4 kg)   LMP 10/05/2016 (Approximate)   BMI 49.75 kg/m   No  flowsheet data found.  No flowsheet data found.      Objective:  Physical Exam:  Gen: Well-appearing, in no acute distress; non-toxic CV: Regular Rate. Well-perfused. Warm.  Resp: Breathing unlabored on room air; no wheezing. Psych: Fluid speech in conversation; appropriate affect; normal thought process Neuro: Sensation intact throughout. No gross coordination deficits.  MSK:  - Left knee: + There is exquisite TTP within the superficial posterior popliteal fossa, with some fullness in this area.  Inspection of the knee is negative for erythema, ecchymosis or effusion.  There are no bony abnormalities.  Patient has no joint line tenderness or patellar pole tenderness to palpation.  There is no knee crepitus.  She is able to extend to 0 degrees  without pain, however any flexion of the knee past 90 degrees recreates significant pain in the posterior aspect of the knee.  She has pain with resisted knee flexion.  Remainder of strength testing is 5/5.  Sensation to light touch intact at the dorsal and plantar aspect of the extremity.  Negative patellar glide, negative Lachman, anterior/posterior drawer.  There is no varus or valgus instability with stress testing at 0 and 30 degrees.  No pain with McMurray's in the anterior aspect of the knee, although again pain does recreate over the posterior lateral popliteal fossa.  MSK Limited knee ultrasound performed, right posterior knee  Popliteal fossa was visualized, there is a very small Baker's cyst between gastrocnemius and semimembranosus.  At the area of point tenderness, there is a superficial vein that is collapsible, scanning both proximally and distally there does not appear to be any clot or abnormality within the vein as it is fully compressible both in short and long axis.   IMPRESSION: Painful superficial vein, without evidence of occlusion.  Small Baker's cyst.    Assessment & Plan:  1.  Posterior knee pain, left 2.  Superficial thrombophlebitis  Patient has had posterior knee pain x1 year that is failed all conservative treatment to this point.  Her knee exam is completely benign, although she has exquisite tenderness to palpation over the posterior popliteal fossa that correlates to the superficial vein that is very tender with digital palpation and ultrasound sono palpation.   -Heat this area for 15-20 minutes at least 3-4 times a day -Meloxicam 15 mg daily to take for inflammation of the vein wall -May add gentle compression, although if this is painful avoid strict compression -We will follow-up in about 4 weeks for reevaluation -Hopeful the inflammation will respond to meloxicam and heat, although if this continues we may consider referral to vein specialist in the future for  potential options  Tammie Brunner, DO PGY-4, Sports Medicine Fellow [facility]

## 2021-03-05 NOTE — Patient Instructions (Addendum)
Your knee exam is reassuring. This corresponds to a superficial vein behind your knee that is very tender. It typically responds to heat, anti-inflammatories. If you're still struggling we can consider referral to the vein specialists to discuss potential options. Compression can be helpful but I wouldn't push this if it's more painful to have a sleeve on the area. Heat 15 minutes at a time at least 3-4 times a day. Meloxicam 15mg  daily with food for inflammation of the vein wall. Don't take ibuprofen, aleve, or diclofenac with this. Follow up with me in 4 weeks for reevaluation.

## 2021-03-11 ENCOUNTER — Encounter: Payer: BC Managed Care – PPO | Admitting: Internal Medicine

## 2021-03-17 ENCOUNTER — Encounter: Payer: Self-pay | Admitting: Internal Medicine

## 2021-03-17 ENCOUNTER — Ambulatory Visit: Payer: BC Managed Care – PPO | Admitting: Internal Medicine

## 2021-03-17 DIAGNOSIS — I809 Phlebitis and thrombophlebitis of unspecified site: Secondary | ICD-10-CM

## 2021-03-17 MED ORDER — SEMAGLUTIDE (1 MG/DOSE) 4 MG/3ML ~~LOC~~ SOPN: 1.0000 mg | PEN_INJECTOR | SUBCUTANEOUS | 0 refills | Status: DC

## 2021-03-17 NOTE — Progress Notes (Signed)
   CC: Sports medicine follow-up  HPI:  [name] is a 55 y.o. person, with a PMH noted below, who presents to the clinic sports medicine follow-up. To see the management of their acute and chronic conditions, please see the A&P note under the Encounters tab.   Past Medical History:  Diagnosis Date   Allergy    Anemia    Arthritis    knees    Asthma    GERD (gastroesophageal reflux disease)    occasionally    Neuromuscular disorder (HCC)    sciatica left leg   Review of Systems:   Review of Systems  Constitutional:  Negative for chills, diaphoresis, fever, malaise/fatigue and weight loss.  Cardiovascular:  Negative for chest pain, palpitations and orthopnea.  Gastrointestinal:  Negative for abdominal pain, constipation, diarrhea, nausea and vomiting.  Musculoskeletal:  Positive for joint pain.  Neurological:  Negative for dizziness and headaches.  Psychiatric/Behavioral:  Negative for depression, substance abuse and suicidal ideas. The patient is nervous/anxious.     Physical Exam:  Vitals:   03/17/21 1108  BP: 135/69  Pulse: 81  Resp: (!) 24  Temp: 98.5 F (36.9 C)  TempSrc: Oral  SpO2: 100%  Weight: 274 lb 8 oz (124.5 kg)  Height: 5\' 2"  (1.575 m)   Physical Exam Constitutional:      General: She is not in acute distress.    Appearance: She is obese. She is not ill-appearing, toxic-appearing or diaphoretic.  HENT:     Head: Normocephalic and atraumatic.  Cardiovascular:     Rate and Rhythm: Normal rate and regular rhythm.  Pulmonary:     Effort: Pulmonary effort is normal.     Breath sounds: Normal breath sounds.  Skin:    General: Skin is warm and dry.  Neurological:     Mental Status: She is alert and oriented to person, place, and time.  Psychiatric:        Mood and Affect: Mood normal.        Behavior: Behavior normal.     Assessment & Plan:   See Encounters Tab for problem based charting.  Patient discussed with Dr. 

## 2021-03-17 NOTE — Assessment & Plan Note (Signed)
Patient's BMI today is 50.21, she has been compliant with her semaglutide 0.5 mg subcutaneous ejection weekly, but has just run out of her medication.  She has not experienced any side effects of semaglutide. - We will increase semaglutide to 1 mg subcutaneous injection weekly - Follow-up in 4 weeks

## 2021-03-17 NOTE — Patient Instructions (Addendum)
Ms. Gauntt,   It was a pleasure seeing you again! Today we discussed your knee pain, stressors at home, and weight loss.   For your knee pain, please continue taking the Mobic 15 mg daily, and using compresses. Please ask your employer where to obtain your specific FMLA paperwork and bring it by the clinic for [name] to work on. Please follow up with Sports medicine next month.   For your stressors at home, please call the clinic at any time, we do have a counselor that you can talk to as well, please let us know if you would like to have this service and we would be happy to refer you to them.   And for your weight loss, I will increase your Ozempic today from the 0.5 mg to 1.0 mg, and I will reorder this medication today.   Have a good day!  Dolan Amen, MD

## 2021-03-17 NOTE — Assessment & Plan Note (Addendum)
Patient referred to sports medicine, and was seen on 03/05/2021.  She had posterior left knee pain, ultrasound was completed there was no clot, and patient was diagnosed with superficial phlebitis.  She has been taking Mobic 15 mg daily and compressions which minimally relieve her pain. She states that the pain from her osteoarthritis and phlebitis have caused increased stress. She is also had an increase in having to provide for family, as her daughter is attending college and finals are coming up. She is having increased responsibilities with her grandchild including taking care of them and transporting them to different appointments.  This past Friday, she was meant to come to the office, but was unable to because she was taking care of her grandchild and had a "breakdown" and was unable to complete either task.  She would like to fill out FMLA work in order to focus more on taking care of her family while continuing treatment for her knee pain.  Assessment: Patient presents for follow-up on her left knee osteoarthritis, and left posterior knee phlebitis.  She states that she will follow-up with sports medicine, but has had increasing stressors including an increase in taking care of grandchildren. We discussed that stress, anxiety can increase pain, and we will continue the current pain management set up by sports medicine.  Patient is in agreement, and will follow up with sports medicine early next month.  Additionally discussed different modalities to address anxiety/stress including medications and speaking with a counselor.  Patient declined at this time, but will call in if she would like to address the symptoms.  Patient will bring in FMLA work to be filled. - Continue Mobic 15 mg daily - Continue warm/cold compressions - Follow-up with sports medicine - Patient will bring in FMLA work

## 2021-03-19 NOTE — Progress Notes (Signed)
Internal Medicine Clinic Attending ? ?Case discussed with Dr. Winters  At the time of the visit.  We reviewed the resident?s history and exam and pertinent patient test results.  I agree with the assessment, diagnosis, and plan of care documented in the resident?s note.  ?

## 2021-04-10 ENCOUNTER — Ambulatory Visit: Payer: BC Managed Care – PPO | Admitting: Student

## 2021-04-10 ENCOUNTER — Encounter: Payer: Self-pay | Admitting: Student

## 2021-04-10 ENCOUNTER — Other Ambulatory Visit: Payer: Self-pay

## 2021-04-10 DIAGNOSIS — M1712 Unilateral primary osteoarthritis, left knee: Secondary | ICD-10-CM

## 2021-04-10 DIAGNOSIS — Z1231 Encounter for screening mammogram for malignant neoplasm of breast: Secondary | ICD-10-CM

## 2021-04-10 DIAGNOSIS — Z0000 Encounter for general adult medical examination without abnormal findings: Secondary | ICD-10-CM | POA: Diagnosis not present

## 2021-04-10 DIAGNOSIS — I10 Essential (primary) hypertension: Secondary | ICD-10-CM | POA: Diagnosis not present

## 2021-04-10 DIAGNOSIS — Z131 Encounter for screening for diabetes mellitus: Secondary | ICD-10-CM

## 2021-04-10 LAB — POCT GLYCOSYLATED HEMOGLOBIN (HGB A1C): Hemoglobin A1C: 5.2 % (ref 4.0–5.6)

## 2021-04-10 LAB — GLUCOSE, CAPILLARY: Glucose-Capillary: 99 mg/dL (ref 70–99)

## 2021-04-10 MED ORDER — AMLODIPINE BESYLATE 10 MG PO TABS: 10.0000 mg | ORAL_TABLET | Freq: Every day | ORAL | 11 refills | Status: DC

## 2021-04-10 NOTE — Assessment & Plan Note (Signed)
Patient BMI remains at 50.32.  States she has been making some dietary changes and has been working with physical therapy but her left knee pain makes it difficult for her to exercise.  She is on Ozempic but not currently interested in a referral to weight loss clinic.  Plan: -- Continue Ozempic 1 mg subcu weekly -- Counseled on importance of dietary changes for weight loss -- Discussed option of weight loss clinic again with patient at next office visit

## 2021-04-10 NOTE — Patient Instructions (Signed)
Thank you, [name] for allowing [name] to provide your care today. Today we discussed your knee pain.  Continue working with physical therapy, taking your medications and trying to cold/hot compressions.  Follow-up with sports medicine as needed.  I have ordered the following labs for you:  Lab Orders         POC Hbg A1C       I will call if any are abnormal. All of your labs can be accessed through "My Chart".   I have ordered the following tests: Mammogram  My Chart Access: [email or web address]  Please follow-up in 4 weeks for follow-up and a Pap smear  Please make sure to arrive 15 minutes prior to your next appointment. If you arrive late, you may be asked to reschedule.    We look forward to seeing you next time. Please call our clinic at [phone] if you have any questions or concerns. The best time to call is Monday-Friday from 9am-4pm, but there is someone available 24/7. If after hours or the weekend, call the main hospital number and ask for the Internal Medicine Resident On-Call. If you need medication refills, please notify your pharmacy one week in advance and they will send [name] a request.   Thank you for letting us take part in your care. Wishing you the best!  Tammie Rainwater, MD 04/10/2021, 11:16 AM IM Resident, PGY-2 [facility]wayne Heck 41:10

## 2021-04-10 NOTE — Assessment & Plan Note (Signed)
Patient continues to have left knee pain that is worse with activity. She described her left knee as aching and sharp.  States she went to the fair with her grandchildren but often had to sit down due to worsening left knee pain from walking. She denies any red flags but reports that she occasionally has left hip pain as well. She has been taking meloxicam daily for the pain. She previously worked with physical therapy but the pain with PT has made it difficult for her to go back. 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 Had a discussion with patient about the possibility that she might have to find another job that does not require constant walking and putting weight on the knee. Patient understanding of this and would like to talk her current employer about extending FLMA form for now. Patient also made aware that her weight is contributing to her knee pain.  Plan: -- Follow-up with sports medicine -- Continue conservative management with Mobic, home exercises, Voltaren gel and heating pad -- Patient advised to re-engage with physical therapy as this will help improve pain long-term. -- Refill FLMA form at next office visit

## 2021-04-10 NOTE — Progress Notes (Signed)
   CC: Left knee pain follow-up  HPI:  [name] is a 55 y.o. female with PMH as below who presents to the clinic for follow-up on her left knee. Please see problem based charting for evaluation, assessment and plan.  Past Medical History:  Diagnosis Date   Allergy    Anemia    Arthritis    knees    Asthma    GERD (gastroesophageal reflux disease)    occasionally    Neuromuscular disorder (HCC)    sciatica left leg    Review of Systems:  Constitutional: Negative for fever or fatigue Eyes: Negative for visual changes Respiratory: Negative for shortness of breath Cardiac: Negative for chest pain MSK: Negative for back pain. Positive for left hip and knee pain Abdomen: Negative for abdominal pain, constipation or diarrhea Neuro: Negative for headache or weakness  Physical Exam: General: Pleasant, severely obese middle-age woman. No acute distress. Cardiac: RRR. No murmurs, rubs or gallops. No LE edema Respiratory: Lungs CTAB. No wheezing or crackles. Abdominal: Soft, symmetric and non tender. Normal BS. Extremities: Atraumatic. Full ROM. Palpable radial and DP pulses. MSK: No tenderness to palpation of the L-spine. Left knee with normal ROM, mild TTP of the posterior popliteal fossa, no effusion, crepitus, warmth or erythema. Neuro: A&O x 3. Moves all extremities   Vitals:   04/10/21 1039 04/10/21 1124  BP: (!) 159/68 (!) 145/58  Pulse: 91 81  Temp: 98.9 F (37.2 C)   TempSrc: Oral   Weight: 275 lb 1.6 oz (124.8 kg)   Height: 5\' 2"  (1.575 m)      Assessment & Plan:   See Encounters Tab for problem based charting.  Patient  Dr. , MD, MPH

## 2021-04-10 NOTE — Assessment & Plan Note (Signed)
Patient's BP elevated with systolic in the 150s with improvement to the 140s on repeat.  Patient denies any headaches, dizziness or blurry vision.  BMP earlier this year showed normal kidney function. Vitals:   04/10/21 1039 04/10/21 1124  BP: (!) 159/68 (!) 145/58   Plan: -- Increase amlodipine from 5 mg to 10 mg -- Continue lisinopril-HCTZ 20-12.5 mg daily -- Repeat BMP at next office visit -- Follow-up in 4 weeks for reevaluation

## 2021-04-10 NOTE — Assessment & Plan Note (Signed)
Screening mammogram ordered today. Patient would like to get her Pap smear done at her 4-week follow-up.

## 2021-04-15 NOTE — Progress Notes (Signed)
Internal Medicine Clinic Attending  Case discussed with Dr. Amponsah  At the time of the visit.  We reviewed the resident's history and exam and pertinent patient test results.  I agree with the assessment, diagnosis, and plan of care documented in the resident's note.  

## 2021-04-28 ENCOUNTER — Ambulatory Visit: Payer: BC Managed Care – PPO

## 2021-05-26 ENCOUNTER — Other Ambulatory Visit: Payer: Self-pay | Admitting: Internal Medicine

## 2021-05-26 MED ORDER — ALBUTEROL SULFATE HFA 108 (90 BASE) MCG/ACT IN AERS: 2.0000 | INHALATION_SPRAY | RESPIRATORY_TRACT | 0 refills | Status: DC | PRN

## 2021-05-26 NOTE — Telephone Encounter (Signed)
Call placed to patient. States this cough feels different fom her normal asthma cough. Denies any other sx. She is out of albuterol HFA and is requesting refill. States she has a home Covid test and will take that now. If she is positive, we will change tomorrow's appt from in-person to tele. She will call back with results.

## 2021-05-26 NOTE — Telephone Encounter (Signed)
Pt reporting a cough x 3 weeks.  Now coughing up yellowish sputum.  Patient has not taken a COVID test but, would like to know if she should take a Home Test before coming into the clinic tomorrow.  Date: 05/27/2021 Status: Sch  Time: 9:15 AM Length: 30  Visit Type: OPEN ESTABLISHED [726] Copay: $10.00  Provider: Gwenevere Abbot, MD     Please call the patient back.

## 2021-05-27 ENCOUNTER — Other Ambulatory Visit: Payer: Self-pay

## 2021-05-27 ENCOUNTER — Encounter: Payer: Self-pay | Admitting: Internal Medicine

## 2021-05-27 ENCOUNTER — Ambulatory Visit: Payer: BC Managed Care – PPO | Admitting: Internal Medicine

## 2021-05-27 VITALS — BP 171/81 | HR 83 | Temp 98.7°F | Ht 62.0 in | Wt 274.0 lb

## 2021-05-27 DIAGNOSIS — R051 Acute cough: Secondary | ICD-10-CM

## 2021-05-27 DIAGNOSIS — J45909 Unspecified asthma, uncomplicated: Secondary | ICD-10-CM | POA: Diagnosis not present

## 2021-05-27 MED ORDER — BENZONATATE 200 MG PO CAPS
200.0000 mg | ORAL_CAPSULE | Freq: Three times a day (TID) | ORAL | 0 refills | Status: AC | PRN
Start: 2021-05-27 — End: 2021-06-26

## 2021-05-27 NOTE — Patient Instructions (Addendum)
Tammie Solis, it was a pleasure seeing you today! You endorsed feeling well today. Below are some of the things we talked about this visit. We look forward to seeing you in the follow up appointment!  Today we discussed: You presented with a cough that has lasted 3 about three weeks. You stated it was getting better. It appears your cough is due to a viral infection. You stated it was getting better. We will give you stronger cough medicine to help with your symptoms. You can use the albuterol inhaler up to 4 times a day to help you recover quickly.   I would like to see you in 2 weeks for blood pressure follow up and we will repeat blood work at that time.   I have ordered the following labs today:  Lab Orders  No laboratory test(s) ordered today      Referrals ordered today:   Referral Orders  No referral(s) requested today     I have ordered the following medication/changed the following medications:   Stop the following medications: There are no discontinued medications.   Start the following medications: Meds ordered this encounter  Medications   benzonatate (TESSALON) 200 MG capsule    Sig: Take 1 capsule (200 mg total) by mouth 3 (three) times daily as needed for cough.    Dispense:  30 capsule    Refill:  0     Follow-up: 2 week follow up for bp   Please make sure to arrive 15 minutes prior to your next appointment. If you arrive late, you may be asked to reschedule.   We look forward to seeing you next time. Please call our clinic at [phone] if you have any questions or concerns. The best time to call is Monday-Friday from 9am-4pm, but there is someone available 24/7. If after hours or the weekend, call the main hospital number and ask for the Internal Medicine Resident On-Call. If you need medication refills, please notify your pharmacy one week in advance and they will send [name] a request.  Thank you for letting us take part in your care. Wishing you the  best!  Thank you, Gwenevere Abbot, MD

## 2021-05-28 ENCOUNTER — Other Ambulatory Visit: Payer: Self-pay | Admitting: Internal Medicine

## 2021-05-28 ENCOUNTER — Other Ambulatory Visit: Payer: Self-pay | Admitting: Student

## 2021-05-28 DIAGNOSIS — Z131 Encounter for screening for diabetes mellitus: Secondary | ICD-10-CM

## 2021-05-28 DIAGNOSIS — I10 Essential (primary) hypertension: Secondary | ICD-10-CM

## 2021-05-28 MED ORDER — SEMAGLUTIDE (1 MG/DOSE) 4 MG/3ML ~~LOC~~ SOPN: 1.0000 mg | PEN_INJECTOR | SUBCUTANEOUS | 0 refills | Status: DC

## 2021-05-28 NOTE — Telephone Encounter (Signed)
Patient to follow-up for elevated BP from office visit 12/06 in 2 weeks.  She had not been taking blood pressure medications for previous week.  If blood pressure better controlled at next office visit, will change medication refill to 90 tablets.

## 2021-05-28 NOTE — Progress Notes (Signed)
Refilled semaglutide

## 2021-05-29 DIAGNOSIS — R059 Cough, unspecified: Secondary | ICD-10-CM | POA: Insufficient documentation

## 2021-05-29 DIAGNOSIS — J45909 Unspecified asthma, uncomplicated: Secondary | ICD-10-CM | POA: Insufficient documentation

## 2021-05-29 NOTE — Assessment & Plan Note (Addendum)
Assessment/Plan Patient presents with an acute cough with sudden onset and a clear mucus. Her associated symptoms include dyspnea on exertion, and wheezing and do not include fevers, chills, runny nose, sensation of fluid running in the back of her throat.She stated it is worse at night compared to the day. DDx considered included acute bronchitis vs GERD vs post-nasal drip. Appears most likely to be acute bronchitis as it started suddenly and is self resolving. It does appear this has caused mild asthma exacerbation in her. GERD is less likely as she is denying reflux symptoms and post nasal drip was considered for worsening of symptoms at night but less likely as she is denying symptoms of post-nasal drip. She is on an ACE inhibitor but this appears to be an unlikely reason for her cough given it is improving.  -Benzoanatate 200 mg TID prn -Albuterol inhaler q6hrs prn

## 2021-05-29 NOTE — Assessment & Plan Note (Signed)
Assessment/Plan Patient had an elevated BP during this visit. She was here around 6 weeks ago and had some adjustments made to her regimen. She does not check her bp regularly at home. Since she was here with an acute illness, it would be best to arrange a telephone visit when patient has a bp log to adjust her regimen. -2 week bp telehealth follow up

## 2021-05-29 NOTE — Progress Notes (Signed)
   CC: coughing  HPI:  Ms.Selin Sox is a 55 y.o. with medical history as below presenting to [facility] for coughing.  Please see problem-based list for further details, assessments, and plans.  Past Medical History:  Diagnosis Date   Allergy    Anemia    Arthritis    knees    Asthma    GERD (gastroesophageal reflux disease)    occasionally    Neuromuscular disorder (HCC)    sciatica left leg   Review of Systems:  Review of system negative unless stated in the problem list or HPI.    Physical Exam:  Vitals:   05/27/21 0931 05/27/21 0942  BP: (!) 164/70 (!) 171/81  Pulse: 82 83  Temp: 98.7 F (37.1 C)   TempSrc: Oral   SpO2: 98%   Weight: 274 lb (124.3 kg)   Height: 5\' 2"  (1.575 m)     Physical Exam General: Well-developed, well-nourished, appearing stated age. Head: Normocephalic without scalp lesions.  Eyes: Conjunctivae pink, sclerae white, without jaundice.  Mouth and Throat: Lips normal color, without lesions. Moist mucus membrane. Neck: Neck supple with full range of motion (ROM).  Lungs: CTAB, wheeze heard with increased expiratory effort from baseline.  Cardiovascular: Normal heart sounds, no r/m/g, 2+ pulses in all extremities Abdomen: No TTP, normal bowel sounds MSK: No asymmetry or muscle atrophy. Full range of motion (ROM) of all joints. Skin: warm, dry good skin turgor, no lesions Neuro: Alert and oriented. CN grossly intact Psych: Normal mood and normal affect   Assessment & Plan:   See Encounters Tab for problem based charting.  Patient seen with Dr. , MD

## 2021-06-05 NOTE — Progress Notes (Signed)
Internal Medicine Clinic Attending  I saw and evaluated the patient.  I personally confirmed the key portions of the history and exam documented by Dr. Khan and I reviewed pertinent patient test results.  The assessment, diagnosis, and plan were formulated together and I agree with the documentation in the resident's note.  

## 2021-06-12 ENCOUNTER — Ambulatory Visit: Payer: BC Managed Care – PPO | Admitting: Internal Medicine

## 2021-06-12 DIAGNOSIS — M79605 Pain in left leg: Secondary | ICD-10-CM

## 2021-06-12 DIAGNOSIS — I10 Essential (primary) hypertension: Secondary | ICD-10-CM

## 2021-06-12 DIAGNOSIS — Z0000 Encounter for general adult medical examination without abnormal findings: Secondary | ICD-10-CM

## 2021-06-12 MED ORDER — LISINOPRIL-HYDROCHLOROTHIAZIDE 20-12.5 MG PO TABS
2.0000 | ORAL_TABLET | Freq: Every day | ORAL | 0 refills | Status: DC
Start: 2021-06-12 — End: 2021-09-04

## 2021-06-12 NOTE — Progress Notes (Signed)
° °  CC: Clearance for return to work  HPI:  [name] is a 55 y.o. female with past medical history as detailed below who presents today for paperwork clearing her to return to work.  Please see problem charting for detail assessment and plan.  Past Medical History:  Diagnosis Date   Allergy    Anemia    Arthritis    knees    Asthma    GERD (gastroesophageal reflux disease)    occasionally    Neuromuscular disorder (HCC)    sciatica left leg   Review of Systems:  Review of Systems  Constitutional:  Negative for weight loss.  Respiratory:  Negative for shortness of breath.   Cardiovascular:  Negative for chest pain and leg swelling.  Gastrointestinal:  Negative for abdominal pain.  Musculoskeletal:  Positive for joint pain. Negative for falls and myalgias.  Neurological:  Negative for weakness.    Physical Exam:  Vitals:   06/12/21 0957  BP: (!) 157/74  Pulse: 86  Temp: 98.7 F (37.1 C)  TempSrc: Oral  SpO2: 99%  Weight: 276 lb (125.2 kg)   Constitutional: Well-appearing female, no acute distress noted. Cardio: Regular rate and rhythm.  No murmurs, rubs, gallops. Pulm: Clear to auscultation bilaterally. Abdomen: Soft, nontender, nondistended. MSK: Negative for extremity edema.  Mild tenderness in lateral joint line of left knee.  Negative for joint effusion or increased warmth. Skin: Skin is warm and dry. Neuro: Alert and oriented x3.  No focal deficit noted.  Bilateral lower extremity strength equal, 5 out of 5.  Sensation equal in bilateral lower extremities.  Normal gait. Psych: Normal mood and affect.  Assessment & Plan:   See Encounters Tab for problem based charting.  Patient seen with Dr. Criselda Peaches

## 2021-06-12 NOTE — Patient Instructions (Signed)
Thank you for visiting the Internal Medicine Clinic today. It was a pleasure to meet you! Today we discussed your leg pain that is kept you out of work for several months now, and your wish to return to work.  I have completed the paperwork for you to return to work.   I have ordered the following for you:   Follow-up: In about 1 month for routine checkup and Pap smear.  Remember: If you have any questions or concerns, please call our clinic at [phone] between 9am-5pm and after hours call [phone] and ask for the internal medicine resident on call. If you feel you are having a medical emergency please call 911.  Champ Mungo, DO

## 2021-06-12 NOTE — Assessment & Plan Note (Signed)
Blood pressure is above goal at 157/74.  Patient is currently managed with lisinopril-HCTZ 20-12.5 mg daily and amlodipine 10 mg daily. Assessment: Blood pressure goal less than 140/90, which patient has not met. Plan: Increase lisinopril-HCTZ to 40-25 mg daily.  We will reassess at 4-week follow-up for general checkup.

## 2021-06-12 NOTE — Assessment & Plan Note (Signed)
Patient will return in about 4 weeks for regular checkup.  Please address health care gaps at that time including Pap smear, mammogram, updated vaccinations.

## 2021-06-12 NOTE — Assessment & Plan Note (Addendum)
Patient presents today to have paperwork for return to work completed.  She states that in around September she had to start taking time off of work due to to severe left leg pain.  This pain initially started behind her knee and at this time she does have some pain in the knee as well as in the thigh at times.  She denies weakness, however states that sometimes her mobility is limited by pain.  Of note the patient does have arthritis to the joint and feels that her arthritis pain is what is contributing to her symptoms the most.  Her pain is adequately managed with Voltaren gel, Mobic, and heating pad at home.  She has been seen by the sports medicine clinic, however it is been sometime since she followed up with them. Assessment: Patient endorses desire to return to work.  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  She feels that her pain level has improved to a point where she would be able to return to work and complete the duties expected of her. Plan: Completed return to work paperwork with the accommodation of minimizing heavy machinery use and allowing gradual resumption of duties that require heavy machinery.  Patient has been counseled to contact the clinic if she begins experiencing pain or worsening of symptoms again.  We will assess how her return to work is going at her 4-week follow-up for general checkup.

## 2021-06-18 NOTE — Progress Notes (Signed)
Internal Medicine Clinic Attending  I saw and evaluated the patient.  I personally confirmed the key portions of the history and exam documented by Dr.  Dean  and I reviewed pertinent patient test results.  The assessment, diagnosis, and plan were formulated together and I agree with the documentation in the resident's note.  

## 2021-07-04 ENCOUNTER — Encounter: Payer: Self-pay | Admitting: Internal Medicine

## 2021-07-04 ENCOUNTER — Other Ambulatory Visit: Payer: Self-pay

## 2021-07-04 ENCOUNTER — Ambulatory Visit: Payer: BC Managed Care – PPO | Admitting: Internal Medicine

## 2021-07-04 VITALS — BP 133/61 | HR 80 | Temp 98.7°F | Resp 24 | Ht 62.0 in | Wt 275.3 lb

## 2021-07-04 DIAGNOSIS — E7849 Other hyperlipidemia: Secondary | ICD-10-CM | POA: Diagnosis not present

## 2021-07-04 DIAGNOSIS — E7841 Elevated Lipoprotein(a): Secondary | ICD-10-CM | POA: Diagnosis not present

## 2021-07-04 DIAGNOSIS — G629 Polyneuropathy, unspecified: Secondary | ICD-10-CM | POA: Diagnosis not present

## 2021-07-04 DIAGNOSIS — E041 Nontoxic single thyroid nodule: Secondary | ICD-10-CM

## 2021-07-04 MED ORDER — ROSUVASTATIN CALCIUM 20 MG PO TABS: 20.0000 mg | ORAL_TABLET | Freq: Every day | ORAL | 11 refills | Status: DC

## 2021-07-04 NOTE — Patient Instructions (Signed)
Tammie Solis,   It was a pleasure seeing you again! Today we discussed your hand and feet numbness, cold intolerance, medication refill.   For your hands, please try a splint to see if this improves your symptoms. For both your hands and feet, I am going to run some tests to ensure it is not a thyroid or vitamin deficiency.   You do have a nodule on your neck, which we will get an ultrasound to better visualize.   Lastly, I have refilled your Crestor! I will reach out to you with the results of your blood work, and we will see you back in about 6 weeks.  Have a good day,  Tammie Mercury, MD

## 2021-07-05 DIAGNOSIS — G629 Polyneuropathy, unspecified: Secondary | ICD-10-CM | POA: Insufficient documentation

## 2021-07-05 DIAGNOSIS — E041 Nontoxic single thyroid nodule: Secondary | ICD-10-CM | POA: Insufficient documentation

## 2021-07-05 NOTE — Assessment & Plan Note (Signed)
Thyroid nodule noted on the L anterior neck.  - Thyroid U/S - TSH

## 2021-07-05 NOTE — Progress Notes (Signed)
° °  CC: Hand and feet numbness, Feeling cold  HPI:  [name] is a 56 y.o. person, with a PMH noted below, who presents to the clinic Hand and feet numbness, Feeling cold. To see the management of their acute and chronic conditions, please see the A&P note under the Encounters tab.   Past Medical History:  Diagnosis Date   Allergy    Anemia    Arthritis    knees    Asthma    GERD (gastroesophageal reflux disease)    occasionally    Neuromuscular disorder (HCC)    sciatica left leg   Review of Systems:   Review of Systems  Constitutional:  Negative for chills, fever, malaise/fatigue and weight loss.  HENT:  Negative for hearing loss and tinnitus.   Eyes:  Negative for blurred vision and double vision.  Respiratory:  Negative for cough and hemoptysis.   Cardiovascular:  Negative for chest pain and palpitations.  Gastrointestinal:  Negative for abdominal pain, constipation, diarrhea, nausea and vomiting.  Musculoskeletal:  Negative for back pain, joint pain, myalgias and neck pain.  Neurological:  Negative for dizziness, tingling, tremors and headaches.       Numbness in feet and hands bilaterally    Physical Exam:  Vitals:   07/04/21 1004 07/04/21 1010  BP: (!) 143/5 133/61  Pulse: 82 80  Resp: (!) 24   Temp: 98.7 F (37.1 C)   TempSrc: Oral   SpO2: 100%   Weight: 275 lb 4.8 oz (124.9 kg)   Height: 5\' 2"  (1.575 m)    Physical Exam Vitals reviewed.  Constitutional:      General: She is not in acute distress.    Appearance: Normal appearance. She is not ill-appearing or toxic-appearing.  Neck:     Vascular: No carotid bruit.     Comments: Small nodule noted at the L thyroid Cardiovascular:     Rate and Rhythm: Normal rate and regular rhythm.     Pulses: Normal pulses.     Heart sounds: Normal heart sounds. No murmur heard.   No friction rub. No gallop.  Pulmonary:     Effort: Pulmonary effort is normal.     Breath sounds: Normal breath sounds. No wheezing,  rhonchi or rales.  Abdominal:     General: Abdomen is flat. Bowel sounds are normal.     Tenderness: There is no abdominal tenderness. There is no guarding.  Musculoskeletal:     Cervical back: Neck supple. No tenderness.     Right lower leg: No edema.     Left lower leg: No edema.     Comments: - Phalen and Tinel sign   Lymphadenopathy:     Cervical: No cervical adenopathy.  Skin:    General: Skin is warm.  Neurological:     Mental Status: She is alert and oriented to person, place, and time.     Sensory: No sensory deficit.     Motor: No weakness.     Coordination: Coordination normal.  Psychiatric:        Mood and Affect: Mood normal.        Behavior: Behavior normal.     Assessment & Plan:   See Encounters Tab for problem based charting.  Patient discussed with Dr.  

## 2021-07-05 NOTE — Assessment & Plan Note (Addendum)
Patient presents to the clinic today with concerns for bilateral hand and feet numbness. She states that there is no associated pain, weakness, loss of balance, or dropping objects. She also notes feeling colder than usual and attributes this to the weather. She additionally notes that her daughter pointed out to her this past month that a mass was on her neck. Nothing exacerbates the numbness, but she notes if she shakes her hands out after holding something for a prolonged period of time, the feeling comes back to her hands. She has not experienced any symptoms like this in the past and says that maybe she is just "getting older."   A/P:  Patient presents for feelings of numbness in her hands and feet that started approximately at the same time. On physical examination, her Tinel and Phalen are negative, Spurling test is negative. Sensation and strength or intact in the bilateral upper and lower extremities. She does not have diabetes, and her last TSH was wnl on 2/22. But, Given the feelings of cold, anterior neck nodule, we will work up her thyroid as well as test for vitamin deficiency. In the interim, will trial splints for 6 weeks as shaking the hands with returning feelings is specific for carpel tunnel and patient has a Hx of carpal tunnel disease.  - TSH, B1, B12,  - US Thyroid - Wrist splints

## 2021-07-05 NOTE — Assessment & Plan Note (Signed)
Refilled Crestor. 

## 2021-07-07 LAB — VITAMIN B1: Thiamine: 96 nmol/L (ref 66.5–200.0)

## 2021-07-07 LAB — VITAMIN B12: Vitamin B-12: 511 pg/mL (ref 232–1245)

## 2021-07-07 LAB — TSH: TSH: 1.67 u[IU]/mL (ref 0.450–4.500)

## 2021-07-08 NOTE — Progress Notes (Signed)
Internal Medicine Clinic Attending ? ?Case discussed with Dr. Winters  At the time of the visit.  We reviewed the resident?s history and exam and pertinent patient test results.  I agree with the assessment, diagnosis, and plan of care documented in the resident?s note.  ?

## 2021-07-15 ENCOUNTER — Encounter (HOSPITAL_COMMUNITY): Payer: Self-pay

## 2021-07-15 ENCOUNTER — Ambulatory Visit (HOSPITAL_COMMUNITY): Payer: BC Managed Care – PPO

## 2021-08-18 ENCOUNTER — Encounter: Payer: Self-pay | Admitting: *Deleted

## 2021-09-01 ENCOUNTER — Encounter: Payer: BC Managed Care – PPO | Admitting: Internal Medicine

## 2021-09-01 NOTE — Progress Notes (Deleted)
OV 1/13 ?HTN ?Medications: Lisinopril- HCTZ 40-25 mg, amlodipine 10 mg, ? ?Weight loss ?Medications: Semaglutide 1 mg ?Last Hgb A1c at 5.2. ?A/P: ?Repeat A1c ? ?Polyneuropathy ?-labs negative, carpal tunnel? ?-follow-up on thyroid ultrasound, get it scheduled ? ?HLD ?Medications: crestor 20 mg ? ?Care gaps ?-Pap last in 2016 -hpv ?-mammogram ?

## 2021-09-04 ENCOUNTER — Ambulatory Visit: Payer: BC Managed Care – PPO | Admitting: Internal Medicine

## 2021-09-04 ENCOUNTER — Ambulatory Visit: Payer: Self-pay | Admitting: Licensed Clinical Social Worker

## 2021-09-04 ENCOUNTER — Telehealth: Payer: Self-pay | Admitting: *Deleted

## 2021-09-04 ENCOUNTER — Other Ambulatory Visit: Payer: Self-pay

## 2021-09-04 VITALS — BP 141/76 | HR 83 | Wt 277.7 lb

## 2021-09-04 DIAGNOSIS — Z59811 Housing instability, housed, with risk of homelessness: Secondary | ICD-10-CM

## 2021-09-04 DIAGNOSIS — E041 Nontoxic single thyroid nodule: Secondary | ICD-10-CM

## 2021-09-04 DIAGNOSIS — G589 Mononeuropathy, unspecified: Secondary | ICD-10-CM

## 2021-09-04 DIAGNOSIS — G5751 Tarsal tunnel syndrome, right lower limb: Secondary | ICD-10-CM | POA: Diagnosis not present

## 2021-09-04 DIAGNOSIS — Z0000 Encounter for general adult medical examination without abnormal findings: Secondary | ICD-10-CM

## 2021-09-04 DIAGNOSIS — I10 Essential (primary) hypertension: Secondary | ICD-10-CM | POA: Diagnosis not present

## 2021-09-04 DIAGNOSIS — Z59819 Housing instability, housed unspecified: Secondary | ICD-10-CM

## 2021-09-04 MED ORDER — LISINOPRIL-HYDROCHLOROTHIAZIDE 20-12.5 MG PO TABS: 2.0000 | ORAL_TABLET | Freq: Every day | ORAL | 0 refills | Status: DC

## 2021-09-04 MED ORDER — IBUPROFEN 400 MG PO TABS: 400.0000 mg | ORAL_TABLET | Freq: Three times a day (TID) | ORAL | 0 refills | Status: AC

## 2021-09-04 NOTE — Patient Instructions (Signed)
Ms.Moxie Wetherbee, it was a pleasure seeing you today! ? ?Today we discussed: ?Housing ?You will receive a phone call from our social worker soon to talk about resources in the community. ? ?Blood pressure ?Your blood pressure was elevated today. I called [facility] and lisinopril-HCTZ will be $6.72 for one month supply.  ? ?Burning in right foot ?I think you may be having nerve pain from compression in your ankle. The treatment for this is ibuprofen. Please take 400 mg every 8 hours for the next 5 days.  ? ?I have ordered the following labs today: ? ?Lab Orders  ?No laboratory test(s) ordered today  ?  ? ?I will call if any are abnormal. All of your labs can be accessed through "My Chart" ?  ?My Chart Access: ?[email or web address] ? ? ?Referrals ordered today:  ? ?Referral Orders  ?No referral(s) requested today  ?  ? ?I have ordered the following medication/changed the following medications:  ? ?Stop the following medications: ?There are no discontinued medications.  ? ?Start the following medications: ?No orders of the defined types were placed in this encounter. ?  ? ?Follow-up:  4 weeks  to repeat blood pressure ? ?Please make sure to arrive 15 minutes prior to your next appointment. If you arrive late, you may be asked to reschedule.  ? ?We look forward to seeing you next time. Please call our clinic at [phone] if you have any questions or concerns. The best time to call is Monday-Friday from 9am-4pm, but there is someone available 24/7. If after hours or the weekend, call the main hospital number and ask for the Internal Medicine Resident On-Call. If you need medication refills, please notify your pharmacy one week in advance and they will send [name] a request. ? ?Thank you for letting us take part in your care. Wishing you the best! ? ?Thank you, ?Dr. Sloan Leiter ?[facility]  ?

## 2021-09-04 NOTE — Chronic Care Management (AMB) (Signed)
?  Care Management  ? ?Social Work Visit Note ? ?09/04/2021 ?Name: Tammie Solis MRN: [id number] DOB: [birth year] ? ?Tammie Solis is a 56 y.o. year old female who sees Masters, Florentina Addison, DO for primary care. The care management team was consulted for assistance with care management and care coordination needs related to Financial Difficulties related to Housing.   ? ?SW received message from Dr. Sloan Leiter. Patient in office inquiring about housing resources. SW unable to speak with patient in office due to other scheduled appointments. SW requested a referral to be placed for SW to outreach patient. SW reviewed chart during this encounter.  ? ?Christen Butter, BSW  ?Social Worker ?[facility]  ?[phone] ?  ?

## 2021-09-04 NOTE — Chronic Care Management (AMB) (Signed)
?  Care Management  ? ?Note ? ?09/04/2021 ?Name: Terasa Mceuen MRN: [id number] DOB: 11-22-1965 ? ?Rieta Lehn is a 56 y.o. year old female who is a primary care patient of Masters, Joellen Jersey, DO. I reached out to [employer] by phone today in response to a referral sent by Ms. Ivin Booty Foss's primary care provider.  ? ?Ms. Burgert was given information about care management services today including:  ?Care management services include personalized support from designated clinical staff supervised by her physician, including individualized plan of care and coordination with other care providers ?24/7 contact phone numbers for assistance for urgent and routine care needs. ?The patient may stop care management services at any time by phone call to the office staff. ? ?Patient agreed to services and verbal consent obtained.  ? ?Follow up plan: ?Telephone appointment with care management team member scheduled for:09/09/21 ? ?Laverda Sorenson  ?Care Guide, Embedded Care Coordination ?[facility]  ?Direct Dial: [phone] ? ?

## 2021-09-04 NOTE — Progress Notes (Signed)
? ? ?Subjective:  ?CC: HTN, burning pain in right foot ? ?HPI: ? ?[name] is a 56 y.o. female with a past medical history stated below and presents today for follow-up on blood pressure and burning pain present in right foot. Please see problem based assessment and plan for additional details. ? ?Past Medical History:  ?Diagnosis Date  ? Allergy   ? Anemia   ? Arthritis   ? knees   ? Asthma   ? GERD (gastroesophageal reflux disease)   ? occasionally   ? Neuromuscular disorder (HCC)   ? sciatica left leg  ? ? ?Current Outpatient Medications on File Prior to Visit  ?Medication Sig Dispense Refill  ? acetaminophen (TYLENOL) 650 MG CR tablet Take 1 tablet (650 mg total) by mouth every 8 (eight) hours as needed for pain. 60 tablet 0  ? albuterol (VENTOLIN HFA) 108 (90 Base) MCG/ACT inhaler Inhale 2 puffs into the lungs every 4 (four) hours as needed for wheezing or shortness of breath. 1 each 0  ? DIETARY MANAGEMENT PRODUCT PO Take 1 each by mouth 2 (two) times daily before a meal.    ? Loratadine 10 MG CAPS Take by mouth.    ? Multiple Vitamins-Minerals (CENTRUM SILVER 50+WOMEN PO) Take 1 each by mouth daily.    ? Probiotic Product (RA PROBIOTIC GUMMIES PO) Take by mouth daily.    ? rosuvastatin (CRESTOR) 20 MG tablet Take 1 tablet (20 mg total) by mouth daily. 30 tablet 11  ? Semaglutide, 1 MG/DOSE, 4 MG/3ML SOPN Inject 1 mg into the skin once a week. 4 mL 0  ? ?No current facility-administered medications on file prior to visit.  ? ? ?Family History  ?Problem Relation Age of Onset  ? Diabetes Mother   ? Kidney failure Mother   ? Cancer Father   ?     prostate  ? Prostate cancer Father   ? Colon cancer Neg Hx   ? Colon polyps Neg Hx   ? Esophageal cancer Neg Hx   ? Rectal cancer Neg Hx   ? Stomach cancer Neg Hx   ? Pancreatic cancer Neg Hx   ? ? ?Social History  ? ?Socioeconomic History  ? Marital status: Single  ?  Spouse name: Not on file  ? Number of children: Not on file  ? Years of education: Not on file   ? Highest education level: Not on file  ?Occupational History  ? Not on file  ?Tobacco Use  ? Smoking status: Never  ? Smokeless tobacco: Never  ?Substance and Sexual Activity  ? Alcohol use: No  ? Drug use: No  ? Sexual activity: Never  ?  Birth control/protection: None  ?Other Topics Concern  ? Not on file  ?Social History Narrative  ? Not on file  ? ?Social Determinants of Health  ? ?Financial Resource Strain: Not on file  ?Food Insecurity: Not on file  ?Transportation Needs: Not on file  ?Physical Activity: Not on file  ?Stress: Not on file  ?Social Connections: Not on file  ?Intimate Partner Violence: Not on file  ? ? ?Review of Systems: ?ROS negative except for what is noted on the assessment and plan. ? ?Objective:  ? ?Vitals:  ? 09/04/21 0853 09/04/21 0912  ?BP: (!) 174/91 (!) 141/76  ?Pulse: 93 83  ?SpO2: 100%   ?Weight: 277 lb 11.2 oz (126 kg)   ? ? ?Physical Exam: ?Gen: A&O x3 and in no apparent distress, well appearing and nourished. ?  Neck: no masses or nodules, AROM intact. ?CV: RRR, no murmurs, S1/S2 presents  ?Resp: Clear to ascultation bilaterally  ?Abd: BS (+) x4, soft, non-tender abdomen, without hepatosplenomegaly or masses ?MSK: muscle strength that is 5/5 in floot flexion and extension, no atrophy of muscles of lower right leg, she has decreased sensation in S1 dermatomal distribution on right foot, dorsalis pedis pulse 2+ bilaterally. ?Skin: good skin turgor, no rashes, unusual bruising, or prominent lesions.  ?Neuro: No focal deficits, grossly normal sensation and coordination.  ?Psych: Oriented x3 and responding appropriately. Intact memory, normal mood, judgement, affect, and insight.  ? ? ?Assessment & Plan:  ?Hypertension ?Medications: Lisinopril-HCTZ 40-25 ?BP elevated to 157/74 at OV 12/22. Lisinopril-HCTZ doubled at tthat time. BP at 133/61 at f/u 1/23. Today BP elevated at 174/91, repeat 141/76. ?A/P: ?Patient states that she has been taking medication every other day because she does  not have enough money to take daily.  She currently has one more week of medications left. I called [facility] and they stated that co-pay for combination pill is $6.72 for one month supply. She was given $ from clinic's petty cash and I let her know where she could pick  up medication. ?F/u in 4 weeks ? ?Health care maintenance ?She is due for pap smear, please complete at follow-up visit. She was HPV- and pap- in 2016. ? ?Thyroid nodule ?Unable to complete imaging due to co-pay. ? ?Housing instability, currently housed, at risk for homelessness ?Patient states that she will likely lose her housing at the end of this month. She is trying to return back to work after taking leave due to knee pain.  ?P: ?Referral to social work ? ?Mononeuropathy ?Patient presents with burning pain in toes on right foot. She states that this gets worse with standing and is relieved by sitting.  She does not have associated weakness in her foot. This has been happening for last several weeks.   ?On exam, she has muscle strength that is 5/5 in floot flexion and extension, no atrophy of muscles of lower right leg, she has decreased sensation in S1 dermatomal distribution on right foot, dorsalis pedis pulse 2+ bilaterally. ?A: Patient presenting with mononeuropathy that improves with rest. Distribution appears to be consistent with posterior tibial compression or tarsal tunnel syndrome. ?P: ?Instructed patient to take Ibuprofen 400 mg every 8 hours for next 3-5 days to see if that helps with inflammation. Spoke with patient to stop taking ibuprofen after that period.  ? ?Patient discussed with Dr. Antony Contras ? ? ?Rudene Christians, D.O. ?[facility]  PGY-1 ?Pager: [phone]  Phone: [phone] ?Date 09/05/2021  Time 6:38 AM  ? ?

## 2021-09-05 ENCOUNTER — Telehealth: Payer: Self-pay

## 2021-09-05 DIAGNOSIS — G589 Mononeuropathy, unspecified: Secondary | ICD-10-CM | POA: Insufficient documentation

## 2021-09-05 DIAGNOSIS — Z59811 Housing instability, housed, with risk of homelessness: Secondary | ICD-10-CM | POA: Insufficient documentation

## 2021-09-05 NOTE — Assessment & Plan Note (Signed)
She is due for pap smear, please complete at follow-up visit. She was HPV- and pap- in 2016. ?

## 2021-09-05 NOTE — Assessment & Plan Note (Signed)
Unable to complete imaging due to co-pay. ?

## 2021-09-05 NOTE — Assessment & Plan Note (Signed)
Patient presents with burning pain in toes on right foot. She states that this gets worse with standing and is relieved by sitting.  She does not have associated weakness in her foot. This has been happening for last several weeks.   ?On exam, she has muscle strength that is 5/5 in floot flexion and extension, no atrophy of muscles of lower right leg, she has decreased sensation in S1 dermatomal distribution on right foot, dorsalis pedis pulse 2+ bilaterally. ?A: Patient presenting with mononeuropathy that improves with rest. Distribution appears to be consistent with posterior tibial compression or tarsal tunnel syndrome. ?P: ?Instructed patient to take Ibuprofen 400 mg every 8 hours for next 3-5 days to see if that helps with inflammation. Spoke with patient to stop taking ibuprofen after that period. ?

## 2021-09-05 NOTE — Assessment & Plan Note (Signed)
Patient states that she will likely lose her housing at the end of this month. She is trying to return back to work after taking leave due to knee pain.  ?P: ?Referral to social work ?

## 2021-09-05 NOTE — Telephone Encounter (Signed)
? ?  Telephone encounter was:  Successful.  ?09/05/2021 ?Name: Tammie Solis MRN: [id number] DOB: 05-03-1966 ? ?Tammie Solis is a 56 y.o. year old female who is a primary care patient of Masters, Florentina Addison, DO . The community resource team was consulted for assistance with  housing ? ?Care guide performed the following interventions: Spoke with patient verified email address and sent information for housing resources per patient request. Letter saved in Epic. ? ?Follow Up Plan:  Care guide will follow up with patient by phone over the next 7-10 days ? ?Jesusmanuel Erbes, AAS Paralegal, CHC ?Care Guide  Embedded Care Coordination ?[facility]  Care Management  ?[address] ?[place], [place] [address] ?[email or web address]  ?? [phone]   ?[email or web address] ?  ?

## 2021-09-05 NOTE — Assessment & Plan Note (Signed)
Medications: Lisinopril-HCTZ 40-25 ?BP elevated to 157/74 at OV 12/22. Lisinopril-HCTZ doubled at tthat time. BP at 133/61 at f/u 1/23. Today BP elevated at 174/91, repeat 141/76. ?A/P: ?Patient states that she has been taking medication every other day because she does not have enough money to take daily.  She currently has one more week of medications left. I called [facility] and they stated that co-pay for combination pill is $6.72 for one month supply. She was given $ from clinic's petty cash and I let her know where she could pick  up medication. ?F/u in 4 weeks ?

## 2021-09-09 ENCOUNTER — Telehealth: Payer: BC Managed Care – PPO

## 2021-09-09 NOTE — Progress Notes (Signed)
Internal Medicine Clinic Attending  Case discussed with Dr. Masters  At the time of the visit.  We reviewed the resident's history and exam and pertinent patient test results.  I agree with the assessment, diagnosis, and plan of care documented in the resident's note.  

## 2021-09-12 ENCOUNTER — Telehealth: Payer: Self-pay

## 2021-09-12 NOTE — Telephone Encounter (Signed)
? ?  Telephone encounter was:  Successful.  ?09/12/2021 ?Name: Tammie Solis MRN: [id number] DOB: Mar 01, 1966 ? ?Tammie Solis is a 56 y.o. year old female who is a primary care patient of Masters, Florentina Addison, DO . The community resource team was consulted for assistance with  housing ? ?Care guide performed the following interventions: Spoke with patient she has received the housing resources emailed on 09/05/21 and is calling them. ? ?Follow Up Plan:  No further follow up planned at this time. The patient has been provided with needed resources. ? ?Io Dieujuste, AAS Paralegal, CHC ?Care Guide  Embedded Care Coordination ?[facility]  Care Management  ?[address] ?[place], [place] [address] ?[email or web address]  ?? [phone]   ?[email or web address] ?  ?

## 2021-09-12 NOTE — Telephone Encounter (Signed)
?  RE: Mobile Mammo event located at: ? ?[facility]  ?      ?[address]    ?[place] [address]    ? ?Date: April 7th  ? ?

## 2021-09-29 ENCOUNTER — Ambulatory Visit: Payer: BC Managed Care – PPO | Admitting: Licensed Clinical Social Worker

## 2021-09-29 NOTE — Patient Instructions (Signed)
Visit Information ? ?Instructions:  ? ?Patient was given the following information about care management and care coordination services today, agreed to services, and gave verbal consent: 1.care management/care coordination services include personalized support from designated clinical staff supervised by their physician, including individualized plan of care and coordination with other care providers 2. 24/7 contact phone numbers for assistance for urgent and routine care needs. 3. The patient may stop care management/care coordination services at any time by phone call to the office staff. ? ?Patient verbalizes understanding of instructions and care plan provided today and agrees to view in MyChart. Active MyChart status confirmed with patient.   ? ?No further follow up required: Patient has SW contact information and will contact SW if needed. ? ?Christen Butter, BSW  ?Social Worker ?[facility]  ?[phone] ?  ? ?  ?

## 2021-09-29 NOTE — Chronic Care Management (AMB) (Signed)
?  Care Management  ? ?Social Work Visit Note ? ?09/29/2021 ?Name: Tammie Solis MRN: [id number] DOB: [birth year] ? ?Tammie Solis is a 56 y.o. year old female who sees Masters, Florentina Addison, DO for primary care. The care management team was consulted for assistance with care management and care coordination needs related to [facility] Resources   ? ?Patient was given the following information about care management and care coordination services today, agreed to services, and gave verbal consent: 1.care management/care coordination services include personalized support from designated clinical staff supervised by their physician, including individualized plan of care and coordination with other care providers 2. 24/7 contact phone numbers for assistance for urgent and routine care needs. 3. The patient may stop care management/care coordination services at any time by phone call to the office staff. ? ?Engaged with patient by telephone for initial visit in response to provider referral for social work chronic care management and care coordination services. ? ?Assessment: Review of patient history, allergies, and health status during evaluation of patient need for care management/care coordination services.   ? ?Interventions:  ?Patient interviewed and appropriate assessments performed ?Collaborated with clinical team regarding patient needs  ?Patient is currently out of work on [employer]. Patient is behind on rent , but has not been served an eviction notice. Patient has an appointment on 09/30/21 at [address] for utility assistance. Patient has outreached; [employer] representative, [employer] and others. All agency's are out of funding or requiring an eviction notice. Patient has outreach a few churches who require patient to be placed on a waiting list. SW recommended patient to request assistance from her support network and surrounding churches. SW recommended patient to apply for disability and FNS benefits. Patient does not have  short term or long term disability benefits and only receiving $200 monthly. SW recommended patient to contact [employer] and request eat well benefit card.  ? ?SDOH (Social Determinants of Health) assessments performed: Yes ?   ? ?Plan:  ?Patient will outreach social worker if any additional resources are needed.  ? ?Christen Butter, BSW  ?Social Worker ?[facility]  ?[phone] ?  ? ? ? ? ? ? ? ? ? ? ? ? ? ? ?

## 2021-10-02 ENCOUNTER — Encounter: Payer: Self-pay | Admitting: Internal Medicine

## 2021-10-03 ENCOUNTER — Ambulatory Visit (INDEPENDENT_AMBULATORY_CARE_PROVIDER_SITE_OTHER): Payer: BC Managed Care – PPO | Admitting: Internal Medicine

## 2021-10-03 DIAGNOSIS — J45909 Unspecified asthma, uncomplicated: Secondary | ICD-10-CM

## 2021-10-03 MED ORDER — BUDESONIDE-FORMOTEROL FUMARATE 80-4.5 MCG/ACT IN AERO: 2.0000 | INHALATION_SPRAY | Freq: Two times a day (BID) | RESPIRATORY_TRACT | 12 refills | Status: DC

## 2021-10-03 NOTE — Telephone Encounter (Signed)
Called Tammie Solis. Stated  c/o cough x 1 month but getting worse. Stated she know she has allergies ; she has tried Claritin and Flonase which have not helped. Stated sometimes she coughs so hard she starts wheezing. Sometimes the cough is productive - pale yellow in color. Unable to come in for an early appt - telehealth appt schedule with Dr Raymondo Band @ 0915 AM this morning - Tammie Solis agreeable.Tammie Solis instructed to have her cell phone with her. ?

## 2021-10-03 NOTE — Progress Notes (Signed)
?  [facility] Internal Medicine Residency Telephone Encounter ?Continuity Care Appointment ? ?HPI:  ?This telephone encounter was created for Tammie Solis on 10/03/2021 for the following purpose/cc cough.    ? ?Past Medical History:  ?Past Medical History:  ?Diagnosis Date  ? Allergy   ? Anemia   ? Arthritis   ? knees   ? Asthma   ? GERD (gastroesophageal reflux disease)   ? occasionally   ? Neuromuscular disorder (HCC)   ? sciatica left leg  ?  ? ?ROS:  ?Review of Systems  ?Constitutional:  Negative for chills and fever.  ?Respiratory:  Positive for cough, sputum production and wheezing.   ?Cardiovascular:  Negative for chest pain and palpitations.  ?Gastrointestinal: Negative.  Negative for diarrhea, nausea and vomiting.  ?Genitourinary: Negative.   ?Musculoskeletal: Negative.   ?Neurological:  Negative for dizziness and headaches.   ? ?Assessment / Plan / Recommendations:  ?Please see A&P under problem oriented charting for assessment of the patient's acute and chronic medical conditions.  ?As always, pt is advised that if symptoms worsen or new symptoms arise, they should go to an urgent care facility or to to ER for further evaluation.  ? ?Consent and Medical Decision Making:  ?Patient discussed with Dr. Mayford Knife ?This is a telephone encounter between Kiara Mcdowell and [employer] on 10/03/2021 for cough and wheezing. The visit was conducted with the patient located at home and Chauncey Mann at [facility]. The patient's identity was confirmed using their DOB and current address. The patient has consented to being evaluated through a telephone encounter and understands the associated risks (an examination cannot be done and the patient may need to come in for an appointment) / benefits (allows the patient to remain at home, decreasing exposure to coronavirus). I personally spent 12 minutes on medical discussion.   ? ? ?

## 2021-10-03 NOTE — Assessment & Plan Note (Signed)
Patient has a history of previously well-controlled asthma with just albuterol inhaler prn.  She states that her allergies have been acting up this season and that over the past month she has had a worsening of her cough and wheezing.  She is coughing up a clear phlegm and during her coughing spells, she has noticed that she becomes very wheezy.  She has been using her albuterol up to 3 times per day and also has noticed that her wheezing has woken her up a couple times per week during the evening. Unable to compete physical exam today as this was a tele-health visit, however, over the phone, her cough sounded hoarse in nature. Patient denies any fevers, chills, chest pain, palpitations, shortness of breath, n/v/d, or any other sxs at this time.  ? ?Plan: ?Given the severity of the patient's symptoms, she falls into the moderate-persistent category of asthma.  Discussed trying a maintenance inhaler with Symbicort two puffs, twice daily. If symptoms do not improve or worsen, advised patient to come back for an in-person appt.  ?

## 2021-10-15 NOTE — Progress Notes (Signed)
Internal Medicine Clinic Attending ° °Case discussed with Dr. Atway  At the time of the visit.  We reviewed the resident’s history and exam and pertinent patient test results.  I agree with the assessment, diagnosis, and plan of care documented in the resident’s note.  °

## 2021-12-31 ENCOUNTER — Other Ambulatory Visit: Payer: Self-pay

## 2021-12-31 ENCOUNTER — Ambulatory Visit (INDEPENDENT_AMBULATORY_CARE_PROVIDER_SITE_OTHER): Payer: Self-pay | Admitting: Internal Medicine

## 2021-12-31 ENCOUNTER — Other Ambulatory Visit (HOSPITAL_COMMUNITY): Payer: Self-pay

## 2021-12-31 ENCOUNTER — Encounter: Payer: Self-pay | Admitting: Internal Medicine

## 2021-12-31 VITALS — BP 150/90 | HR 85 | Temp 98.7°F | Resp 24 | Ht 62.0 in | Wt 281.9 lb

## 2021-12-31 DIAGNOSIS — J45909 Unspecified asthma, uncomplicated: Secondary | ICD-10-CM

## 2021-12-31 DIAGNOSIS — M1712 Unilateral primary osteoarthritis, left knee: Secondary | ICD-10-CM

## 2021-12-31 DIAGNOSIS — I10 Essential (primary) hypertension: Secondary | ICD-10-CM

## 2021-12-31 MED ORDER — BUDESONIDE-FORMOTEROL FUMARATE 80-4.5 MCG/ACT IN AERO
2.0000 | INHALATION_SPRAY | Freq: Two times a day (BID) | RESPIRATORY_TRACT | 12 refills | Status: DC
  Filled 2021-12-31: qty 10.2, 30d supply, fill #0
  Filled 2022-04-15: qty 10.2, 30d supply, fill #1
  Filled 2022-06-09: qty 10.2, 30d supply, fill #2
  Filled 2022-08-26: qty 10.2, 30d supply, fill #3

## 2021-12-31 MED ORDER — LISINOPRIL-HYDROCHLOROTHIAZIDE 20-12.5 MG PO TABS
2.0000 | ORAL_TABLET | Freq: Every day | ORAL | 3 refills | Status: DC
  Filled 2021-12-31: qty 60, 30d supply, fill #0
  Filled 2022-03-19: qty 60, 30d supply, fill #1
  Filled 2022-04-15: qty 60, 30d supply, fill #2
  Filled 2022-06-09: qty 60, 30d supply, fill #3

## 2021-12-31 NOTE — Patient Instructions (Signed)
Thank you, [name] for allowing [name] to provide your care today. Today we discussed:  Blood pressure: I sent your medications to the [facility] pharmacy. Address is   [facility]  [address] [place] [address]  Phone:  [phone]  Fax:  [phone]   Asthma: Symbicort was sent to [facility] outpatient pharmacy.  Knee: I am hoping that injections helps you. We made need to get imaging with MRI in future.  I have ordered the following labs for you:  Lab Orders  No laboratory test(s) ordered today     Referrals ordered today:   Referral Orders  No referral(s) requested today     I have ordered the following medication/changed the following medications:   Stop the following medications: Medications Discontinued During This Encounter  Medication Reason   lisinopril-hydrochlorothiazide (ZESTORETIC) 20-12.5 MG tablet Reorder   budesonide-formoterol (SYMBICORT) 80-4.5 MCG/ACT inhaler Reorder     Start the following medications: Meds ordered this encounter  Medications   budesonide-formoterol (SYMBICORT) 80-4.5 MCG/ACT inhaler    Sig: Inhale 2 puffs into the lungs 2 (two) times daily.    Dispense:  1 each    Refill:  12    IM program   lisinopril-hydrochlorothiazide (ZESTORETIC) 20-12.5 MG tablet    Sig: Take 2 tablets by mouth daily.    Dispense:  60 tablet    Refill:  3    IM program     Follow up: 4 weeks  We look forward to seeing you next time. Please call our clinic at [phone] if you have any questions or concerns. The best time to call is Monday-Friday from 9am-4pm, but there is someone available 24/7. If after hours or the weekend, call the main hospital number and ask for the Internal Medicine Resident On-Call. If you need medication refills, please notify your pharmacy one week in advance and they will send [name] a request.   Thank you for trusting me with your care. Wishing you the best!   Rudene Christians,  DO [facility]

## 2021-12-31 NOTE — Progress Notes (Unsigned)
Subjective:  CC: left knee pain and blood pressure  HPI:  [name] is a 56 y.o. female with a past medical history stated below and presents today for left knee pain and hypertension. Please see problem based assessment and plan for additional details.  Past Medical History:  Diagnosis Date   Allergy    Anemia    Arthritis    knees    Asthma    GERD (gastroesophageal reflux disease)    occasionally    Neuromuscular disorder (HCC)    sciatica left leg    Current Outpatient Medications on File Prior to Visit  Medication Sig Dispense Refill   acetaminophen (TYLENOL) 650 MG CR tablet Take 1 tablet (650 mg total) by mouth every 8 (eight) hours as needed for pain. 60 tablet 0   albuterol (VENTOLIN HFA) 108 (90 Base) MCG/ACT inhaler Inhale 2 puffs into the lungs every 4 (four) hours as needed for wheezing or shortness of breath. 1 each 0   DIETARY MANAGEMENT PRODUCT PO Take 1 each by mouth 2 (two) times daily before a meal.     Loratadine 10 MG CAPS Take by mouth.     Multiple Vitamins-Minerals (CENTRUM SILVER 50+WOMEN PO) Take 1 each by mouth daily.     Probiotic Product (RA PROBIOTIC GUMMIES PO) Take by mouth daily.     rosuvastatin (CRESTOR) 20 MG tablet Take 1 tablet (20 mg total) by mouth daily. 30 tablet 11   Semaglutide, 1 MG/DOSE, 4 MG/3ML SOPN Inject 1 mg into the skin once a week. 4 mL 0   No current facility-administered medications on file prior to visit.    Family History  Problem Relation Age of Onset   Diabetes Mother    Kidney failure Mother    Cancer Father        prostate   Prostate cancer Father    Colon cancer Neg Hx    Colon polyps Neg Hx    Esophageal cancer Neg Hx    Rectal cancer Neg Hx    Stomach cancer Neg Hx    Pancreatic cancer Neg Hx     Social History   Socioeconomic History   Marital status: Single    Spouse name: Not on file   Number of children: Not on file   Years of education: Not on file   Highest education level: Not on  file  Occupational History   Not on file  Tobacco Use   Smoking status: Never   Smokeless tobacco: Never  Substance and Sexual Activity   Alcohol use: No   Drug use: No   Sexual activity: Never    Birth control/protection: None  Other Topics Concern   Not on file  Social History Narrative   Not on file   Social Determinants of Health   Financial Resource Strain: High Risk (09/29/2021)   Overall Financial Resource Strain (CARDIA)    Difficulty of Paying Living Expenses: Hard  Food Insecurity: No Food Insecurity (09/29/2021)   Hunger Vital Sign    Worried About Running Out of Food in the Last Year: Never true    Ran Out of Food in the Last Year: Never true  Transportation Needs: No Transportation Needs (09/29/2021)   PRAPARE - Administrator, Civil Service (Medical): No    Lack of Transportation (Non-Medical): No  Physical Activity: Not on file  Stress: Not on file  Social Connections: Not on file  Intimate Partner Violence: Not on file  Review of Systems: ROS negative except for what is noted on the assessment and plan.  Objective:   Vitals:   12/31/21 1327 12/31/21 1328 12/31/21 1411  BP:  (!) 158/81 (!) 150/90  Pulse:  85   Resp:  (!) 24   Temp:  98.7 F (37.1 C)   TempSrc:  Oral   SpO2:  98%   Weight: 281 lb 14.4 oz (127.9 kg) 281 lb 14.4 oz (127.9 kg)   Height:  5\' 2"  (1.575 m)     Physical Exam: Constitutional: well-appearing, in no acute distress Cardiovascular: regular rate and rhythm, no m/r/g Pulmonary/Chest: normal work of breathing on room air, lungs clear to auscultation bilaterally Abdominal: soft, non-tender, non-distended MSK: tenderness to palpation in the posterior popliteal fossa.  No effusion or erythema of the knee present, no bony abnormalities, she does not have joint line tenderness and no knee crepitus.  She is able to extend to 0 degrees but states her knee locked when she did so.  She has significant pain with knee flexion,  strength testing is 5 out of 5 in flexion and extension.  Negative anterior/posterior drawer, difficult to perform McMurray's test due to pain. Neurological: alert & oriented x 3, 5/5 strength in bilateral upper and lower extremities, normal gait  Assessment & Plan:  Osteoarthritis of left knee Ms. Mostek presents with worsening pain in her left knee.  She states that she watches her grandchild and has difficulty keeping up with him.  She has about 2 hours that she can walk and then her knee begins to hurt drastically more.  Her symptoms are worst overnight, when she rolls over she has excruciating pain to the back and side of her knee.  She was evaluated with ultrasound last July and no signs of Baker's cyst.  She had a cortisone injection in early 2022 and this provided a couple of days of pain relief but did not help significantly.  She also saw sports medicine in September 2022.  MSK ultrasound demonstrated superficial vein in area of her pain but was compressible with no evidence of superficial thrombophlebitis.  X-ray showed tricompartmental osteoarthritis.  She endorses mechanical locking with knee extension.  Pain is described as burning sharp that shoots up and down the leg.  She has been using hot and cold compresses and they have not been helpful. On physical exam of left knee, she has tenderness to palpation in the posterior popliteal fossa.  No effusion or erythema of the knee present, no bony abnormalities, she does not have joint line tenderness and no knee crepitus.  She is able to extend to 0 degrees but states her knee locked when she did so.  She has significant pain with knee flexion, strength testing is 5 out of 5 in flexion and extension.  Negative anterior/posterior drawer, difficult to perform McMurray's test due to pain. A/P: Patient presenting with unusual knee pain.  She has known osteoarthritis from x-ray, her pain is mostly located posteriorly with no joint line tenderness.  The  locking sensation she describes describing is concerning for possible meniscal tear.  She has a low resources currently as she is losing her job and soon to lose her insurance.  Eventually I think getting an MRI would be beneficial.  For today we will try a steroid injection to see if we can provide temporary relief.  I talked with her about applying for the orange card or Medicaid once she loses her insurance. PROCEDURE NOTE  PROCEDURE: left  knee joint steroid injection.  PREOPERATIVE DIAGNOSIS: Osteoarthritis of the left knee.  POSTOPERATIVE DIAGNOSIS: Osteoarthritis of the left knee.  PROCEDURE: The patient was apprised of the risks and the benefits of the procedure and informed consent was obtained, as witnessed by Dr. Mikey Bussing. Time-out procedure was performed, with confirmation of the patient's name, date of birth, and correct identification of the left knee to be injected. The patient's knee was then marked at the appropriate site for injection placement. The knee was sterilely prepped with Betadine. A 2 mg solution of Kenalog was drawn up into a 5 mL syringe with a 1 mL of 1% lidocaine. The patient was injected with a 25-gauge needle at the superior patellar aspect of her left flexed knee. There were no complications. The patient tolerated the procedure well. There was minimal bleeding. The patient was instructed to ice her knee upon leaving clinic and refrain from overuse over the next 3 days. The patient was instructed to go to the emergency room with any usual pain, swelling, or redness occurred in the injected area. The patient was given a followup appointment to evaluate response to the injection to his increased range of motion and reduction of pain.  The procedure was supervised by attending physician, Dr. Mikey Bussing.  Asthma She presented via telehealth in April due to asthma exacerbation.  She was prescribed Symbicort at that time and was not able to pick up inhaler as it cost $50 at the  [facility] pharmacy.  Since then her breathing has improved but she does feel short of breath daily whenever she is outside playing with her 12-year-old grandson. A/P: Symbicort inhaler sent to [facility] outpatient pharmacy.  Patient asked to call clinic if inhaler remains too expensive.  Hypertension Ms. Cirrincione presents with history of hypertension.  Blood pressure elevated at 158/81.  When this was rechecked it was at 150/90.  She states that she has difficulty affording her medications.  Last medication refill was in March for 30 days.  She denies headache, blurry vision, shortness of breath, or chest pain.  She does not check her blood pressure at home.  She states that prescription cost about $17 at [facility]. A/P: Blood pressure remains elevated due to nonadherence.  We will send medication to [facility] outpatient pharmacy where it should cost about $5. Continue lisinopril-HCTZ 40-25 mg daily   Patient discussed with Dr. Cleda Daub   Paxten Appelt, D.O. [facility] Internal [facility]  PGY-2 Pager: [phone]  Phone: [phone] Date 01/01/2022  Time 7:31 AM

## 2022-01-01 MED ORDER — LIDOCAINE HCL (PF) 1 % IJ SOLN: 2.0000 mL | Freq: Once | INTRAMUSCULAR | Status: DC

## 2022-01-01 MED ORDER — TRIAMCINOLONE ACETONIDE 40 MG/ML IJ SUSP: 40.0000 mg | Freq: Once | INTRAMUSCULAR | Status: DC

## 2022-01-01 NOTE — Progress Notes (Signed)
Internal Medicine Clinic Attending  I saw and evaluated the patient.  I personally confirmed the key portions of the history and exam documented by the resident  and I reviewed pertinent patient test results.  The assessment, diagnosis, and plan were formulated together and I agree with the documentation in the resident's note.  

## 2022-01-01 NOTE — Assessment & Plan Note (Signed)
She presented via telehealth in April due to asthma exacerbation.  She was prescribed Symbicort at that time and was not able to pick up inhaler as it cost $50 at the [facility] pharmacy.  Since then her breathing has improved but she does feel short of breath daily whenever she is outside playing with her 58-year-old grandson. A/P: Symbicort inhaler sent to [facility] outpatient pharmacy.  Patient asked to call clinic if inhaler remains too expensive.

## 2022-01-01 NOTE — Assessment & Plan Note (Signed)
Tammie Solis presents with history of hypertension.  Blood pressure elevated at 158/81.  When this was rechecked it was at 150/90.  She states that she has difficulty affording her medications.  Last medication refill was in March for 30 days.  She denies headache, blurry vision, shortness of breath, or chest pain.  She does not check her blood pressure at home.  She states that prescription cost about $17 at [facility]. A/P: Blood pressure remains elevated due to nonadherence.  We will send medication to [facility] outpatient pharmacy where it should cost about $5. Continue lisinopril-HCTZ 40-25 mg daily

## 2022-01-01 NOTE — Addendum Note (Signed)
Addended by: Carlynn Purl C on: 01/01/2022 10:17 AM   Modules accepted: Orders

## 2022-01-01 NOTE — Assessment & Plan Note (Addendum)
Tammie Solis presents with worsening pain in her left knee.  She states that she watches her grandchild and has difficulty keeping up with him.  She has about 2 hours that she can walk and then her knee begins to hurt drastically more.  Her symptoms are worst overnight, when she rolls over she has excruciating pain to the back and side of her knee.  She was evaluated with ultrasound last July and no signs of Baker's cyst.  She had a cortisone injection in early 2022 and this provided a couple of days of pain relief but did not help significantly.  She also saw sports medicine in September 2022.  [facility] ultrasound demonstrated superficial vein in area of her pain but was compressible with no evidence of superficial thrombophlebitis.  X-ray showed tricompartmental osteoarthritis.  She endorses mechanical locking with knee extension.  Pain is described as burning sharp that shoots up and down the leg.  She has been using hot and cold compresses and they have not been helpful. On physical exam of left knee, she has tenderness to palpation in the posterior popliteal fossa.  No effusion or erythema of the knee present, no bony abnormalities, she does not have joint line tenderness and no knee crepitus.  She is able to extend to 0 degrees but states her knee locked when she did so.  She has significant pain with knee flexion, strength testing is 5 out of 5 in flexion and extension.  Negative anterior/posterior drawer, difficult to perform McMurray's test due to pain. A/P: Patient presenting with unusual knee pain.  She has known osteoarthritis from x-ray, her pain is mostly located posteriorly with no joint line tenderness.  The locking sensation she describes describing is concerning for possible meniscal tear.  She has a low resources currently as she is losing her job and soon to lose her insurance.  Eventually I think getting an MRI would be beneficial.  For today we will try a steroid injection to see if we can provide  temporary relief.  I talked with her about applying for the orange card or Medicaid once she loses her insurance. PROCEDURE NOTE  PROCEDURE: left knee joint steroid injection.  PREOPERATIVE DIAGNOSIS: Osteoarthritis of the left knee.  POSTOPERATIVE DIAGNOSIS: Osteoarthritis of the left knee.  PROCEDURE: The patient was apprised of the risks and the benefits of the procedure and informed consent was obtained, as witnessed by Dr. Mikey Bussing. Time-out procedure was performed, with confirmation of the patient's name, date of birth, and correct identification of the left knee to be injected. The patient's knee was then marked at the appropriate site for injection placement. The knee was sterilely prepped with Betadine. A 2 mg solution of Kenalog was drawn up into a 5 mL syringe with a 1 mL of 1% lidocaine. The patient was injected with a 25-gauge needle at the superior patellar aspect of her left flexed knee. There were no complications. The patient tolerated the procedure well. There was minimal bleeding. The patient was instructed to ice her knee upon leaving clinic and refrain from overuse over the next 3 days. The patient was instructed to go to the emergency room with any usual pain, swelling, or redness occurred in the injected area. The patient was given a followup appointment to evaluate response to the injection to his increased range of motion and reduction of pain.  The procedure was supervised by attending physician, Dr. Mikey Bussing.

## 2022-01-29 ENCOUNTER — Encounter (HOSPITAL_COMMUNITY): Payer: Self-pay

## 2022-01-29 ENCOUNTER — Ambulatory Visit (INDEPENDENT_AMBULATORY_CARE_PROVIDER_SITE_OTHER): Payer: Self-pay

## 2022-01-29 ENCOUNTER — Ambulatory Visit (HOSPITAL_COMMUNITY)
Admission: EM | Admit: 2022-01-29 | Discharge: 2022-01-29 | Disposition: A | Discharge status: Home / Self Care | Payer: Self-pay | Attending: Emergency Medicine | Admitting: Emergency Medicine

## 2022-01-29 DIAGNOSIS — Z23 Encounter for immunization: Secondary | ICD-10-CM

## 2022-01-29 DIAGNOSIS — W19XXXA Unspecified fall, initial encounter: Secondary | ICD-10-CM

## 2022-01-29 DIAGNOSIS — S91311A Laceration without foreign body, right foot, initial encounter: Secondary | ICD-10-CM

## 2022-01-29 DIAGNOSIS — M79671 Pain in right foot: Secondary | ICD-10-CM

## 2022-01-29 MED ORDER — TETANUS-DIPHTH-ACELL PERTUSSIS 5-2.5-18.5 LF-MCG/0.5 IM SUSY
PREFILLED_SYRINGE | INTRAMUSCULAR | Status: AC
  Filled 2022-01-29: qty 0.5

## 2022-01-29 MED ORDER — DOXYCYCLINE HYCLATE 100 MG PO CAPS
100.0000 mg | ORAL_CAPSULE | Freq: Two times a day (BID) | ORAL | 0 refills | Status: AC
  Filled 2022-01-29: qty 14, 7d supply, fill #0

## 2022-01-29 MED ORDER — LIDOCAINE HCL (PF) 2 % IJ SOLN
INTRAMUSCULAR | Status: AC
  Filled 2022-01-29: qty 5

## 2022-01-29 MED ORDER — TRIPLE ANTIBIOTIC 3.5-400-5000 EX OINT
1.0000 | TOPICAL_OINTMENT | Freq: Once | CUTANEOUS | Status: AC
  Administered 2022-01-29: 1 via CUTANEOUS

## 2022-01-29 MED ORDER — TETANUS-DIPHTH-ACELL PERTUSSIS 5-2.5-18.5 LF-MCG/0.5 IM SUSY
0.5000 mL | PREFILLED_SYRINGE | Freq: Once | INTRAMUSCULAR | Status: AC
  Administered 2022-01-29: 0.5 mL via INTRAMUSCULAR

## 2022-01-29 NOTE — ED Triage Notes (Signed)
Pt injured toe on right foot due to fall and scraped arm on left arm since today . Pt denies head injury

## 2022-01-29 NOTE — ED Provider Notes (Signed)
[facility]    CSN: [id number] Arrival date & time: 01/29/22  1743      History   Chief Complaint Chief Complaint  Patient presents with   Toe Injury    Pt fell and injured toe on right foot and scraped left arm x1day    HPI Tammie Solis is a 56 y.o. female.   56 year old female, Tammie Solis,  presents to urgent care with chief complaint of falling prior to arrival on concrete steps when went outside.  Pt states she scraped her arm but right foot is bleeding and hurts. Will update tetanus, repair lac, order xray of right foot.   The history is provided by the patient. No language interpreter was used.    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   Thyroid nodule 07/05/2021   Polyneuropathy 07/05/2021   Cough 05/29/2021   Asthma 05/29/2021   Superficial phlebitis 03/17/2021   Left leg pain 02/13/2021   Health care maintenance 01/09/2021   Hyperlipidemia 07/30/2020   Osteoarthritis of left knee 07/26/2020   Hypertension 03/12/2020   Osteoarthritis of left hip 03/12/2020   Morbid obesity (HCC) 03/12/2020   Iron deficiency anemia 03/12/2020    Past Surgical History:  Procedure Laterality Date   CESAREAN SECTION     x2    OB History     Gravida  5   Para  2   Term  2   Preterm      AB  3   Living  2      SAB  2   IAB      Ectopic  1   Multiple      Live Births               Home Medications    Prior to Admission medications   Medication Sig Start Date End Date Taking? Authorizing Provider   doxycycline (VIBRAMYCIN) 100 MG capsule Take 1 capsule (100 mg total) by mouth 2 (two) times daily for 7 days. 01/29/22 02/05/22 Yes Emad Brechtel, Para March, NP  acetaminophen (TYLENOL) 650 MG CR tablet Take 1 tablet (650 mg total) by mouth every 8 (eight) hours as needed for pain. 07/26/20   Roylene Reason, MD  albuterol (VENTOLIN HFA) 108 (90 Base) MCG/ACT inhaler Inhale 2 puffs into the lungs every 4 (four) hours as needed for wheezing or shortness of breath. 05/26/21   Masters, Katie, DO  budesonide-formoterol (SYMBICORT) 80-4.5 MCG/ACT inhaler Inhale 2 puffs into the lungs 2 (two) times daily. 12/31/21   Masters, Katie, DO  DIETARY MANAGEMENT PRODUCT PO Take 1 each by mouth 2 (two) times daily before a meal.    [provider]  lisinopril-hydrochlorothiazide (ZESTORETIC) 20-12.5 MG tablet Take 2 tablets by mouth daily. 12/31/21   Masters, Katie, DO  Loratadine 10 MG CAPS Take by mouth.    [provider]  Multiple Vitamins-Minerals (CENTRUM SILVER 50+WOMEN PO) Take 1 each by mouth daily.    [provider]  Probiotic Product (RA  PROBIOTIC GUMMIES PO) Take by mouth daily.    [provider]  rosuvastatin (CRESTOR) 20 MG tablet Take 1 tablet (20 mg total) by mouth daily. 07/04/21 07/04/22  Dolan Amen, MD  Semaglutide, 1 MG/DOSE, 4 MG/3ML SOPN Inject 1 mg into the skin once a week. 05/28/21   Masters, Florentina Addison, DO    Family History Family History  Problem Relation Age of Onset   Diabetes Mother    Kidney failure Mother    Cancer Father        prostate   Prostate cancer Father    Colon cancer Neg Hx    Colon polyps Neg Hx    Esophageal cancer Neg Hx    Rectal cancer Neg Hx    Stomach cancer Neg Hx    Pancreatic cancer Neg Hx     Social History Social History   Tobacco Use   Smoking status: Never   Smokeless tobacco: Never  Substance Use Topics   Alcohol use: No   Drug use: No     Allergies   Penicillins   Review of Systems Review of Systems   Musculoskeletal:  Positive for gait problem.  Skin:  Positive for wound.  All other systems reviewed and are negative.    Physical Exam Triage Vital Signs ED Triage Vitals  Enc Vitals Group     BP 01/29/22 1834 (!) 175/83     Pulse Rate 01/29/22 1834 85     Resp 01/29/22 1834 12     Temp 01/29/22 1834 98.3 F (36.8 C)     Temp Source 01/29/22 1834 Oral     SpO2 01/29/22 1834 96 %     Weight 01/29/22 1838 272 lb (123.4 kg)     Height 01/29/22 1838 5\' 2"  (1.575 m)     Head Circumference --      Peak Flow --      Pain Score 01/29/22 1837 8     Pain Loc --      Pain Edu? --      Excl. in GC? --    No data found.  Updated Vital Signs BP (!) 175/83 (BP Location: Left Arm)   Pulse 85   Temp 98.3 F (36.8 C) (Oral)   Resp 12   Ht 5\' 2"  (1.575 m)   Wt 272 lb (123.4 kg)   LMP 10/05/2016 (Approximate) Comment: spotting at times with breast soreness .  SpO2 96%   BMI 49.75 kg/m   Visual Acuity Right Eye Distance:   Left Eye Distance:   Bilateral Distance:    Right Eye Near:   Left Eye Near:    Bilateral Near:     Physical Exam Vitals and nursing note reviewed. Exam conducted with a chaperone present.  Constitutional:      Appearance: Normal appearance. She is well-developed and well-groomed.  HENT:     Head: Normocephalic.  Eyes:     General: Lids are normal. Vision grossly intact.     Extraocular Movements: Extraocular movements intact.     Pupils: Pupils are equal, round, and reactive to light.  Neck:     Trachea: Trachea normal.  Cardiovascular:     Rate and Rhythm: Normal rate and regular rhythm.     Pulses: Normal pulses.          Radial pulses are 2+ on the right side and 2+ on the left side.       Dorsalis pedis pulses are 2+ on the right side and 2+ on  the left side.  Pulmonary:     Effort: Pulmonary effort is normal.     Breath sounds: Normal breath sounds and air entry.  Feet:     Comments: Laceration to foot Skin:    General: Skin is warm.      Capillary Refill: Capillary refill takes less than 2 seconds.  Neurological:     General: No focal deficit present.     Mental Status: She is alert and oriented to person, place, and time.     GCS: GCS eye subscore is 4. GCS verbal subscore is 5. GCS motor subscore is 6.     Cranial Nerves: Cranial nerves 2-12 are intact.     Sensory: Sensation is intact.     Motor: Motor function is intact.     Coordination: Coordination is intact.     Gait: Gait is intact.  Psychiatric:        Attention and Perception: Attention normal.        Mood and Affect: Mood normal.        Speech: Speech normal.        Behavior: Behavior normal. Behavior is cooperative.      UC Treatments / Results  Labs (all labs ordered are listed, but only abnormal results are displayed) Labs Reviewed - No data to display  EKG   Radiology DG Foot Complete Right  Result Date: 01/29/2022 CLINICAL DATA:  Trauma, fall EXAM: RIGHT FOOT COMPLETE - 3+ VIEW COMPARISON:  07/07/2014 FINDINGS: No displaced fracture or dislocation is seen. Degenerative changes are noted with bony spurs in the dorsal aspect of the intertarsal joints. Plantar spur is seen in calcaneus. There is flattening of plantar arch. IMPRESSION: No recent fracture or dislocation is seen in right foot. Electronically Signed   By: Ernie Avena M.D.   On: 01/29/2022 19:14    Procedures Laceration Repair  Date/Time: 01/29/2022 7:19 PM  Performed by: Clancy Gourd, NP Authorized by: Clancy Gourd, NP   Consent:    Consent obtained:  Verbal   Consent given by:  Patient   Risks, benefits, and alternatives were discussed: yes     Risks discussed:  Infection, pain, retained foreign body, need for additional repair, poor cosmetic result, tendon damage, vascular damage, poor wound healing and nerve damage Universal protocol:    Procedure explained and questions answered to patient or proxy's satisfaction: yes     Relevant documents present and  verified: yes     Test results available: yes     Imaging studies available: yes     Site/side marked: yes     Immediately prior to procedure, a time out was called: yes     Patient identity confirmed:  Verbally with patient, arm band and hospital-assigned identification number Anesthesia:    Anesthesia method:  Nerve block   Block location:  Toes/digit   Block anesthetic:  Lidocaine 2% w/o epi   Block injection procedure:  Anatomic landmarks identified, anatomic landmarks palpated, introduced needle, negative aspiration for blood and incremental injection   Block outcome:  Anesthesia achieved Laceration details:    Location:  Toe   Toe location:  L big toe (and left 2nd toe)   Length (cm):  4 Pre-procedure details:    Preparation:  Patient was prepped and draped in usual sterile fashion and imaging obtained to evaluate for foreign bodies Exploration:    Limited defect created (wound extended): no     Hemostasis achieved with:  Direct pressure   Imaging obtained: x-ray  Imaging outcome: foreign body not noted     Wound exploration: wound explored through full range of motion     Wound extent: no foreign bodies/material noted     Contaminated: no   Treatment:    Area cleansed with:  Povidone-iodine, chlorhexidine and saline   Amount of cleaning:  Extensive   Irrigation solution:  Sterile saline   Irrigation method:  Syringe   Visualized foreign bodies/material removed: no     Debridement:  None   Undermining:  None   Scar revision: no   Skin repair:    Repair method:  Sutures (# 7 left great toe, #3 left 2nd toe)   Suture size:  4-0   Suture material:  Nylon   Number of sutures:  10 Approximation:    Approximation:  Close Repair type:    Repair type:  Simple Post-procedure details:    Dressing:  Antibiotic ointment, bulky dressing and non-adherent dressing   Procedure completion:  Tolerated well, no immediate complications  (including critical care  time)  Medications Ordered in UC Medications  Tdap (BOOSTRIX) injection 0.5 mL (0.5 mLs Intramuscular Given 01/29/22 2002)  neomycin-bacitracin-polymyxin [phone] OINT 1 Application (1 Application Apply externally Given 01/29/22 2019)    Initial Impression / Assessment and Plan / UC Course  I have reviewed the triage vital signs and the nursing notes.  Pertinent labs & imaging results that were available during my care of the patient were reviewed by me and considered in my medical decision making (see chart for details).  Clinical Course as of 01/29/22 2041  Thu Jan 29, 2022  2000 Negative fracture of toes/foot [JD]    Clinical Course User Index [JD] Clancy Gourd, NP    Ddx:Toe laceration, need for tetanus booster Final Clinical Impressions(s) / UC Diagnoses   Final diagnoses:  Need for tetanus booster  Fall, initial encounter  Foot laceration, right, initial encounter     Discharge Instructions      Keep wound clean and dry. Rest,elevate, daily dressing changes. Elevate, take antibiotic as directed. Follow up in 3 days w PCP for wound check. Return in 12 days for suture removal.sooner if new or worsening issues or concerns.    ED Prescriptions     Medication Sig Dispense Auth. Provider   doxycycline (VIBRAMYCIN) 100 MG capsule Take 1 capsule (100 mg total) by mouth 2 (two) times daily for 7 days. 14 capsule Monita Swier, Para March, NP      PDMP not reviewed this encounter.   Clancy Gourd, NP 01/29/22 2041

## 2022-01-29 NOTE — Discharge Instructions (Addendum)
Keep wound clean and dry. Rest,elevate, daily dressing changes. Elevate, take antibiotic as directed. Follow up in 3 days w PCP for wound check. Return in 12 days for suture removal.sooner if new or worsening issues or concerns.

## 2022-01-30 ENCOUNTER — Other Ambulatory Visit (HOSPITAL_COMMUNITY): Payer: Self-pay

## 2022-01-30 ENCOUNTER — Telehealth: Payer: Self-pay | Admitting: Internal Medicine

## 2022-01-30 NOTE — Telephone Encounter (Signed)
Pt states she took a fall yesterday and had to have stitches at the [facility]  in her Right Foot.   Pt states the did not give her anything for the pain.  The pt also states she has a boot on her foot but, thinks she would do better with crutches and wants to know how to go about getting those instead

## 2022-01-30 NOTE — Telephone Encounter (Signed)
Thanks for the update. I will see her on Monday.

## 2022-01-30 NOTE — Telephone Encounter (Signed)
Return pt's call - stated when she went to UC, they did not give her anything for pain. Pt had schedule an appt for Monday 8/14 when she talked to the front office. Offered her telehealth appt today ; she stated she prefers to wait until Monday. Informed pt to try Tylenol. She also stated she was given a boot but prefers crutches and wanted to know where to get them. Told pt to call [facility]  or go to one of the Medical supply stores.

## 2022-02-02 ENCOUNTER — Ambulatory Visit: Payer: Self-pay | Admitting: Student

## 2022-02-02 ENCOUNTER — Other Ambulatory Visit (HOSPITAL_COMMUNITY): Payer: Self-pay

## 2022-02-02 ENCOUNTER — Encounter: Payer: Self-pay | Admitting: Student

## 2022-02-02 DIAGNOSIS — S91311A Laceration without foreign body, right foot, initial encounter: Secondary | ICD-10-CM

## 2022-02-02 MED ORDER — TRAMADOL HCL 50 MG PO TABS
50.0000 mg | ORAL_TABLET | Freq: Four times a day (QID) | ORAL | 0 refills | Status: AC | PRN
  Filled 2022-02-02: qty 12, 3d supply, fill #0

## 2022-02-02 MED ORDER — IBUPROFEN 400 MG PO TABS
800.0000 mg | ORAL_TABLET | Freq: Four times a day (QID) | ORAL | 1 refills | Status: DC | PRN
  Filled 2022-02-02: qty 60, 8d supply, fill #0

## 2022-02-02 NOTE — Progress Notes (Signed)
   CC: Urgent care follow up  HPI:  [name] is a 56 y.o. female with PMH as below who presents to clinic to follow-up after recent visit to the urgent care for foot injury. Please see problem based charting for evaluation, assessment and plan.  Past Medical History:  Diagnosis Date   Allergy    Anemia    Arthritis    knees    Asthma    GERD (gastroesophageal reflux disease)    occasionally    Neuromuscular disorder (HCC)    sciatica left leg    Review of Systems:  Constitutional: Negative for fatigue, fevers, chills, or weight changes. Eyes: Negative for visual changes MSK: Negative for back pain. Positive for fall and foot pain Abdomen: Negative for nausea or vomiting Neuro: Negative for headache or weakness  Physical Exam: General: Pleasant, well-appearing elderly female in a wheelchair. No acute distress. Cardiac: RRR. No murmurs, rubs or gallops. No LE edema Respiratory: Lungs CTAB. No wheezing or crackles. Abdominal: Soft, symmetric and non tender. Normal BS. Skin: Warm, dry and intact without rashes or lesions Extremities: Radial and DP pulses 2+ and symmetric. Plantar aspect of R 1st and 2nd digits with laceration closed with stiches with surrounding edema but no erythema. Moderately tender. NVI distally. (See media tab). Neuro: A&O x 3. Moves all extremities. Normal sensation to gross touch.  Psych: Appropriate mood and affect.  Vitals:   02/02/22 0928  BP: (!) 142/72  Pulse: 93  Temp: 98.3 F (36.8 C)  TempSrc: Oral  SpO2: 98%  Weight: 279 lb 1.6 oz (126.6 kg)  Height: 5\' 2"  (1.575 m)    Assessment & Plan:    See Encounters Tab for problem based charting.  Patient discussed with Dr. , MD, MPH

## 2022-02-02 NOTE — Patient Instructions (Signed)
Thank you, Tammie Solis for allowing [name] to provide your care today. Today we discussed your recent urgent care visit after your fall.  Continue daily dressing changes and antibiotic course.  I have ordered the following medication/changed the following medications:  Start ibuprofen 800 mg every 6 hours as needed for mild pain. Start tramadol 50 mg every 6 hours as needed for moderate to severe pain  My Chart Access: [email or web address]  Please follow-up in 1 week for stitches removal  Please make sure to arrive 15 minutes prior to your next appointment. If you arrive late, you may be asked to reschedule.    We look forward to seeing you next time. Please call our clinic at [phone] if you have any questions or concerns. The best time to call is Monday-Friday from 9am-4pm, but there is someone available 24/7. If after hours or the weekend, call the main hospital number and ask for the Internal Medicine Resident On-Call. If you need medication refills, please notify your pharmacy one week in advance and they will send [name] a request.   Thank you for letting us take part in your care. Wishing you the best!  Steffanie Rainwater, MD 02/02/2022, 9:57 AM IM Resident, PGY-3 [place]ne Heck 41:10

## 2022-02-02 NOTE — Assessment & Plan Note (Addendum)
Patient presents today for clinic follow-up after recent visit today urgent care on 8/10 for foot injury.  Patient states she was walking into her house with slippers on when she slipped and fell. She braced herself with her right arm and sustain some bruises. She also sustained laceration to the bottom of her right 1st and 2nd toes. She was evaluated at the urgent care and received 10 sutures to both lacerations. Xray of the foot showed flattening of the plantar arm but not acute fractures or dislocation. She was given the Tetanus vaccine. Dressing was applied and she was discharged home with a 7-day course of doxycycline. Patient reports right foot pain that is about 8/10 on pain scale. States her daughter has been changing the dressing daily. Sutures are stable in place, no signs of infection around wound. Will work on pain control and re-evaluate for suture removal next week.   Plan:  -Finish 7-day course or doxycyline 100 mg BID -Start ibuprofen 800 mg q6h prn for mild-moderate pain -Start tramadol 50 mg q6h prn for severe pain (provided 3-day supply) -Continue daily dressing changes -Follow up in 1 week for suture removal

## 2022-02-03 NOTE — Progress Notes (Signed)
Internal Medicine Clinic Attending  Case discussed with Dr. Amponsah  At the time of the visit.  We reviewed the resident's history and exam and pertinent patient test results.  I agree with the assessment, diagnosis, and plan of care documented in the resident's note.  

## 2022-02-12 ENCOUNTER — Ambulatory Visit: Payer: Self-pay | Admitting: Student

## 2022-02-12 ENCOUNTER — Encounter: Payer: Self-pay | Admitting: Student

## 2022-02-12 DIAGNOSIS — S91311A Laceration without foreign body, right foot, initial encounter: Secondary | ICD-10-CM

## 2022-02-12 NOTE — Progress Notes (Signed)
Internal Medicine Clinic Attending  Case discussed with Dr. Amponsah  At the time of the visit.  We reviewed the resident's history and exam and pertinent patient test results.  I agree with the assessment, diagnosis, and plan of care documented in the resident's note.  

## 2022-02-12 NOTE — Assessment & Plan Note (Addendum)
Patient here for removal of her sutures. She has finished her 7-day course of doxycycline. Her daughter has been changing the dressing and cleaning the wounds.  She continues to endorse some pain around her R big toe but denies any fevers or chills. On exam, sutures are stable in place with good granulation tissue around the laceration but no erythema or warmth. I removed her sutures and cleaned the area with alcohol wipe. The area remains intact and healing appropriately with no signs of infection. She is still slightly tender to the plantar aspect of the right big toe. Patient advised to continue to clean the area daily and try to wear her regular show as tolerated.  -Advised to call clinic if she notices any signs of infection such as erythema, drainage, warmth, or worsening pain -Continue ibuprofen as needed for pain

## 2022-02-12 NOTE — Progress Notes (Signed)
   CC: Suture Removal  HPI:  [name] is a 56 y.o. female with PMH as below who presents today for suture removal. Please see problem based charting for evaluation, assessment and plan.  Past Medical History:  Diagnosis Date   Allergy    Anemia    Arthritis    knees    Asthma    GERD (gastroesophageal reflux disease)    occasionally    Neuromuscular disorder (HCC)    sciatica left leg    Review of Systems:  Constitutional: Negative for fever or chills Extremities. Positive for right foot pain. Skin: Positive for right foot laceration Neuro: Positive for occasional foot numbness. Negative for headache or weakness  Physical Exam: General: Pleasant, well-appearing middle-aged woman.  No acute distress. Cardiac: RRR. No murmurs, rubs or gallops. No LE edema Respiratory: Lungs CTAB. No wheezing or crackles. Skin: Warm, dry and intact without rashes or lesions Extremities: Radial and DP pulses 2+ and symmetric. Sutures on the plantar aspect of R 1st and 2nd digits removed. Wound healing appropriately with surrounding granulation tissue.  Neurovascular intact distally. Neuro: A&O x 3. Moves all extremities.  Normal sensation to gross touch. Psych: Appropriate mood and affect.  Vitals:   02/12/22 0939 02/12/22 1027  BP: (!) 183/80 (!) 143/72  Pulse: 98 81  Temp: 98.3 F (36.8 C)   TempSrc: Oral   SpO2: 100%   Weight: 280 lb 4.8 oz (127.1 kg)     Assessment & Plan:   Foot laceration, right, initial encounter Patient here for removal of her sutures. She has finished her 7-day course of doxycycline. Her daughter has been changing the dressing and cleaning the wounds.  She continues to endorse some pain around her R big toe but denies any fevers or chills. On exam, sutures are stable in place with good granulation tissue around the laceration but no erythema or warmth. I removed her sutures and cleaned the area with alcohol wipe. The area remains intact and healing  appropriately with no signs of infection. She is still slightly tender to the plantar aspect of the right big toe. Patient advised to continue to clean the area daily and try to wear her regular show as tolerated.  -Advised to call clinic if she notices any signs of infection such as erythema, drainage, warmth, or worsening pain -Continue ibuprofen as needed for pain    See Encounters Tab for problem based charting.  Patient discussed with Dr.  Avis Epley, MD, MPH

## 2022-02-12 NOTE — Patient Instructions (Addendum)
Thank you, [name] for allowing [name] to provide your care today. Today, we removed the sutures between your right 1st and 2nd toes. The skin is now intact and healing appropriately. Continue to clean the area daily and trying wearing your regular shoe this weekend.    Call the office if you start noticing drainage, worsening pain or redness.  My Chart Access: [email or web address]  Please follow-up as needed or with PCP  Please make sure to arrive 15 minutes prior to your next appointment. If you arrive late, you may be asked to reschedule.    We look forward to seeing you next time. Please call our clinic at [phone] if you have any questions or concerns. The best time to call is Monday-Friday from 9am-4pm, but there is someone available 24/7. If after hours or the weekend, call the main hospital number and ask for the Internal Medicine Resident On-Call. If you need medication refills, please notify your pharmacy one week in advance and they will send [name] a request.   Thank you for letting us take part in your care. Wishing you the best!  Steffanie Rainwater, MD 02/12/2022, 10:13 AM IM Resident, PGY-3 [facility] Heck 41:10

## 2022-03-19 ENCOUNTER — Other Ambulatory Visit (HOSPITAL_COMMUNITY): Payer: Self-pay

## 2022-03-20 ENCOUNTER — Other Ambulatory Visit: Payer: Self-pay

## 2022-03-20 ENCOUNTER — Ambulatory Visit: Payer: Self-pay | Admitting: Internal Medicine

## 2022-03-20 ENCOUNTER — Encounter: Payer: Self-pay | Admitting: Internal Medicine

## 2022-03-20 VITALS — BP 119/61 | HR 87 | Temp 98.2°F | Ht 62.0 in | Wt 275.5 lb

## 2022-03-20 DIAGNOSIS — Z23 Encounter for immunization: Secondary | ICD-10-CM

## 2022-03-20 DIAGNOSIS — S91311A Laceration without foreign body, right foot, initial encounter: Secondary | ICD-10-CM

## 2022-03-20 NOTE — Progress Notes (Unsigned)
   CC: check up  HPI:[name] is a 56 y.o. female who presents for evaluation of toe wound. Please see individual problem based A/P for details.  Patient seen for evaluation of right great toe laceration. Wound has been healing after course of abx. Stitches have also been removed. Per pt, her daughter helps her care for wound, and does not believe it is healing correctly. She has not noticed any redness or swelling. The patient does report small amount of superficial discomfort in between toes if she has been walking throughout the day. She usually does not need to take anything for discomfort.  No pain at rest and without complaints today.    Depression, PHQ-9: Based on the patients  [place] Visit from 03/20/2022 in [place]  PHQ-9 Total Score 0      score we have .  Past Medical History:  Diagnosis Date   Allergy    Anemia    Arthritis    knees    Asthma    GERD (gastroesophageal reflux disease)    occasionally    Neuromuscular disorder (HCC)    sciatica left leg   Review of Systems:   Review of Systems  Constitutional: Negative.   Cardiovascular: Negative.   Musculoskeletal: Negative.   Skin: Negative.      Physical Exam: Vitals:   03/20/22 0910  BP: 119/61  Pulse: 87  Temp: 98.2 F (36.8 C)  TempSrc: Oral  SpO2: 98%  Weight: 275 lb 8 oz (125 kg)  Height: 5\' 2"  (1.575 m)     General: NAD HEENT: Conjunctiva nl , antiicteric sclerae, moist mucous membranes, no exudate or erythema Cardiovascular: Normal rate, regular rhythm.  No murmurs, rubs, or gallops Pulmonary : Equal breath sounds, No wheezes, rales, or rhonchi Abdominal: soft, nontender,  bowel sounds present Ext: No edema in lower extremities, no tenderness to palpation of lower extremities.  Skin: wound in flexor surface of right great toe with flaking/sloughing skin and hyperpigmentation. No erythema, induration, or fluctuance.  Assessment & Plan:   See  Encounters Tab for problem based charting.   Healing process likely impaired due to location of wound. Wound does not appear to be infected at this time. No concern for deeper infection or joint involvement either. Does not need antibiotics at this time. Plan for 1 month follow up to re-evaluate.   Patient seen with Dr. Evette Doffing

## 2022-03-20 NOTE — Assessment & Plan Note (Signed)
Patient seen for evaluation of right great toe laceration. Wound has been healing after course of abx. Stitches have also been removed. Per pt, her daughter helps her care for wound, and does not believe it is healing correctly. She has not noticed any redness or swelling. The patient does report small amount of superficial discomfort in between toes if she has been walking throughout the day. She usually does not need to take anything for discomfort.  No pain at rest and without complaints today.   No redness or erythema appreciated. Does have flaking/sloughing of skin in toe crease with hyperpigmentation. No tenderness to palpation.  No pain with passive movement of the joint.    Healing process likely impaired due to location of wound. Wound does not appear to be infected at this time. No concern for deeper infection or joint involvement either. Does not need antibiotics at this time.

## 2022-03-20 NOTE — Patient Instructions (Signed)
Dear Tammie Solis,  Thank you for trusting [name] with your care today.   We evaluated your toe wound. Fortunately, it does seem to be healing.  We recommend that you keep caring for it, carefully cutting back any dead skin. For now, do not apply any lotions to the area.   Please follow up in 1 month to recheck it.

## 2022-03-22 ENCOUNTER — Encounter: Payer: Self-pay | Admitting: Internal Medicine

## 2022-03-23 NOTE — Progress Notes (Signed)
Internal Medicine Clinic Attending  I saw and evaluated the patient.  I personally confirmed the key portions of the history and exam documented by Dr. Gawaluck and I reviewed pertinent patient test results.  The assessment, diagnosis, and plan were formulated together and I agree with the documentation in the resident's note.  

## 2022-04-16 ENCOUNTER — Other Ambulatory Visit (HOSPITAL_COMMUNITY): Payer: Self-pay

## 2022-05-22 DIAGNOSIS — Z419 Encounter for procedure for purposes other than remedying health state, unspecified: Secondary | ICD-10-CM | POA: Diagnosis not present

## 2022-06-03 ENCOUNTER — Other Ambulatory Visit: Payer: Self-pay

## 2022-06-03 ENCOUNTER — Other Ambulatory Visit (HOSPITAL_COMMUNITY): Payer: Self-pay

## 2022-06-03 ENCOUNTER — Ambulatory Visit: Payer: Medicaid Other | Admitting: Internal Medicine

## 2022-06-03 DIAGNOSIS — R7303 Prediabetes: Secondary | ICD-10-CM | POA: Diagnosis not present

## 2022-06-03 DIAGNOSIS — Z6841 Body Mass Index (BMI) 40.0 and over, adult: Secondary | ICD-10-CM | POA: Diagnosis not present

## 2022-06-03 DIAGNOSIS — Z7985 Long-term (current) use of injectable non-insulin antidiabetic drugs: Secondary | ICD-10-CM | POA: Diagnosis not present

## 2022-06-03 DIAGNOSIS — S91311A Laceration without foreign body, right foot, initial encounter: Secondary | ICD-10-CM

## 2022-06-03 DIAGNOSIS — N951 Menopausal and female climacteric states: Secondary | ICD-10-CM

## 2022-06-03 LAB — POCT GLYCOSYLATED HEMOGLOBIN (HGB A1C): Hemoglobin A1C: 6.1 % — AB (ref 4.0–5.6)

## 2022-06-03 LAB — GLUCOSE, CAPILLARY: Glucose-Capillary: 145 mg/dL — ABNORMAL HIGH (ref 70–99)

## 2022-06-03 MED ORDER — SEMAGLUTIDE(0.25 OR 0.5MG/DOS) 2 MG/3ML ~~LOC~~ SOPN
0.2500 mg | PEN_INJECTOR | SUBCUTANEOUS | 1 refills | Status: DC
  Filled 2022-06-03: qty 3, 30d supply, fill #0

## 2022-06-03 MED ORDER — VENLAFAXINE HCL 37.5 MG PO TABS
ORAL_TABLET | ORAL | 0 refills | Status: DC
  Filled 2022-06-03: qty 67, 37d supply, fill #0

## 2022-06-03 NOTE — Progress Notes (Signed)
CC: toe wound  HPI:  Tammie Solis is a 56 y.o. female living with a history stated below and presents today for follow-up of her right toe/foot wound. Please see problem based assessment and plan for additional details.  Past Medical History:  Diagnosis Date   Allergy    Anemia    Arthritis    knees    Asthma    GERD (gastroesophageal reflux disease)    occasionally    Neuromuscular disorder (HCC)    sciatica left leg    Current Outpatient Medications on File Prior to Visit  Medication Sig Dispense Refill   acetaminophen (TYLENOL) 650 MG CR tablet Take 1 tablet (650 mg total) by mouth every 8 (eight) hours as needed for pain. 60 tablet 0   albuterol (VENTOLIN HFA) 108 (90 Base) MCG/ACT inhaler Inhale 2 puffs into the lungs every 4 (four) hours as needed for wheezing or shortness of breath. 1 each 0   budesonide-formoterol (SYMBICORT) 80-4.5 MCG/ACT inhaler Inhale 2 puffs into the lungs 2 (two) times daily. 10.2 g 12   DIETARY MANAGEMENT PRODUCT PO Take 1 each by mouth 2 (two) times daily before a meal.     ibuprofen (ADVIL) 400 MG tablet Take 2 tablets (800 mg total) by mouth every 6 (six) hours as needed for mild pain. 60 tablet 1   lisinopril-hydrochlorothiazide (ZESTORETIC) 20-12.5 MG tablet Take 2 tablets by mouth daily. 60 tablet 3   Loratadine 10 MG CAPS Take by mouth.     Multiple Vitamins-Minerals (CENTRUM SILVER 50+WOMEN PO) Take 1 each by mouth daily.     Probiotic Product (RA PROBIOTIC GUMMIES PO) Take by mouth daily.     rosuvastatin (CRESTOR) 20 MG tablet Take 1 tablet (20 mg total) by mouth daily. 30 tablet 11   Current Facility-Administered Medications on File Prior to Visit  Medication Dose Route Frequency Provider Last Rate Last Admin   lidocaine (PF) (XYLOCAINE) 1 % injection 2 mL  2 mL Other Once Gust Rung, DO       triamcinolone acetonide (KENALOG-40) injection 40 mg  40 mg Intramuscular Once Gust Rung, DO        Family History  Problem  Relation Age of Onset   Diabetes Mother    Kidney failure Mother    Cancer Father        prostate   Prostate cancer Father    Colon cancer Neg Hx    Colon polyps Neg Hx    Esophageal cancer Neg Hx    Rectal cancer Neg Hx    Stomach cancer Neg Hx    Pancreatic cancer Neg Hx     Social History   Socioeconomic History   Marital status: Single    Spouse name: Not on file   Number of children: Not on file   Years of education: Not on file   Highest education level: Not on file  Occupational History   Not on file  Tobacco Use   Smoking status: Never   Smokeless tobacco: Never  Substance and Sexual Activity   Alcohol use: No   Drug use: No   Sexual activity: Never    Birth control/protection: None  Other Topics Concern   Not on file  Social History Narrative   Not on file   Social Determinants of Health   Financial Resource Strain: High Risk (09/29/2021)   Overall Financial Resource Strain (CARDIA)    Difficulty of Paying Living Expenses: Hard  Food Insecurity: No Food Insecurity (  06/03/2022)   Hunger Vital Sign    Worried About Running Out of Food in the Last Year: Never true    Ran Out of Food in the Last Year: Never true  Transportation Needs: No Transportation Needs (06/03/2022)   PRAPARE - Administrator, Civil Service (Medical): No    Lack of Transportation (Non-Medical): No  Physical Activity: Not on file  Stress: Not on file  Social Connections: Moderately Isolated (06/03/2022)   Social Connection and Isolation Panel [NHANES]    Frequency of Communication with Friends and Family: More than three times a week    Frequency of Social Gatherings with Friends and Family: More than three times a week    Attends Religious Services: Never    Database administrator or Organizations: Yes    Attends Banker Meetings: Never    Marital Status: Never married  Intimate Partner Violence: Not At Risk (06/03/2022)   Humiliation, Afraid, Rape, and  Kick questionnaire    Fear of Current or Ex-Partner: No    Emotionally Abused: No    Physically Abused: No    Sexually Abused: No    Review of Systems: ROS negative except for what is noted on the assessment and plan.  Vitals:   06/03/22 0959  BP: (!) 144/62  Pulse: 92  Temp: 98.2 F (36.8 C)  TempSrc: Oral  SpO2: 99%  Weight: 281 lb (127.5 kg)  Height: 5\' 2"  (1.575 m)    Physical Exam: Constitutional: well-appearing female sitting in chair, in no acute distress Cardiovascular: regular rate and rhythm, no m/r/g Pulmonary/Chest: normal work of breathing on room air, lungs clear to auscultation bilaterally MSK: normal bulk and tone. No erythema around right great toe.  No tenderness to palpation and normal range of movement Neurological: alert & oriented x 3, no focal deficit Skin: warm and dry.  Psych: normal mood and behavior  Assessment & Plan:    Patient discussed with Dr.  Vasomotor symptoms due to menopause Patient states that she has been having significant hot flashes due to menopause.  The symptoms have been interfering with her day-to-day activities and are causing her a lot of distress, thus she would like to start a medication to help with this.  We discussed that HRT may not be appropriate for her, given that she has hypertension, hyperlipidemia, obesity, and prediabetes.  HRT may put her at an increased risk of having a stroke or heart attack given her risk factors, thus we would avoid that.  We did also discuss starting venlafaxine, and she is agreeable to this.  Plan: -Venlafaxine 37.5 mg daily for 1 week followed by 75 mg once daily thereafter -Follow-up in 4 weeks to assess response  Prediabetes The patient states that she has a history of prediabetes, but upon chart review, all of her A1c's have been less than 5.7%.  We rechecked her A1c today, and it is elevated at 6.1%, putting the patient in the category of prediabetes.  We discussed working on  the patient's diet and exercise, but she notes that her diet has not been the best given that it is Christmas time.  She states that she walks every day (about 3000 steps) and is working to increase this and exercise.  Plan: -Ozempic 0.25 mg/week -Provider referral exercise program  Morbid obesity (HCC) BMI is elevated at 51.4.  The patient was previously on Ozempic for weight loss and had success with this, but she has been off of  it for quite a while now.  Patient is interested in getting back on this medication for weight loss and is also interested in the provider referral exercise program.  Plan: -Provider referral exercise program -Ozempic 0.25 mg/week [facility] not available in [place] at starting dose)  Foot laceration, right, initial encounter The patient presents to the [facility] for follow-up of her right great toe laceration.  Back in August, the patient fell and required stitches for this wound and a course of antibiotics.  She took a week of doxycycline 100 mg twice daily in August and has not required any antibiotics since then.  Today, the patient states that her pain is improved.  She is able to ambulate without much discomfort and wear her shoes without difficulty.  She denies any redness around the area, drainage, fevers, or chills.  Previously, her daughter was helping her clean the wound, but they have not had to do that recently as it has mostly healed.  She did have some increased pain around the area because she moved this over the other day, but this pain was relieved with ibuprofen.  Healing process is likely impaired due to the location of the wound and the patient's history of prediabetes.  At this time, the wound does not appear to be infected.  No need for antibiotics or any further wound care.   Elza Rafter, D.O. [facility] Internal Medicine, PGY-2 Phone: [phone] Date 06/03/2022 Time 10:53 AM

## 2022-06-03 NOTE — Patient Instructions (Signed)
Thank you, Ms.Sharita Bienaime for allowing [name] to provide your care today. Today we discussed:  Weight loss: Sent in ozempic and a referral to the exercise program  Hot flashes: Start venlafaxine: You will take 37.5 mg daily (1 tablet) for a week and if you do well with this dose, you can increase to 75 mg daily (2 tablets)  Right toe: The wound appears to be healing well!  I have ordered the following labs for you:   Lab Orders         Glucose, capillary         POC Hbg A1C       Referrals ordered today:    Referral Orders         Amb Referral To Provider Referral Exercise Program (P.R.E.P)      I have ordered the following medication/changed the following medications:   Stop the following medications: Medications Discontinued During This Encounter  Medication Reason   Semaglutide, 1 MG/DOSE, 4 MG/3ML SOPN      Start the following medications: Meds ordered this encounter  Medications   Semaglutide,0.25 or 0.5MG /DOS, 2 MG/3ML SOPN    Sig: Inject 0.25 mg into the skin once a week.    Dispense:  3 mL    Refill:  1   venlafaxine (EFFEXOR) 37.5 MG tablet    Sig: Take 1 tablet (37.5 mg total) by mouth daily for 7 days, THEN 2 tablets (75 mg total) daily.    Dispense:  67 tablet    Refill:  0     Follow up:  4 weeks     Should you have any questions or concerns please call the internal medicine clinic at [phone].     Elza Rafter, D.O. [facility]

## 2022-06-03 NOTE — Assessment & Plan Note (Addendum)
Patient states that she has been having significant hot flashes due to menopause.  The symptoms have been interfering with her day-to-day activities and are causing her a lot of distress, thus she would like to start a medication to help with this.  We discussed that HRT may not be appropriate for her, given that she has hypertension, hyperlipidemia, obesity, and prediabetes.  HRT may put her at an increased risk of having a stroke or heart attack given her risk factors, thus we would avoid that.  We did also discuss starting venlafaxine, and she is agreeable to this.  Plan: -Venlafaxine 37.5 mg daily for 1 week followed by 75 mg once daily thereafter -Follow-up in 4 weeks to assess response

## 2022-06-03 NOTE — Assessment & Plan Note (Signed)
BMI is elevated at 51.4.  The patient was previously on Ozempic for weight loss and had success with this, but she has been off of it for quite a while now.  Patient is interested in getting back on this medication for weight loss and is also interested in the provider referral exercise program.  Plan: -Provider referral exercise program -Ozempic 0.25 mg/week [facility] not available in [place] at starting dose)

## 2022-06-03 NOTE — Assessment & Plan Note (Signed)
The patient states that she has a history of prediabetes, but upon chart review, all of her A1c's have been less than 5.7%.  We rechecked her A1c today, and it is elevated at 6.1%, putting the patient in the category of prediabetes.  We discussed working on the patient's diet and exercise, but she notes that her diet has not been the best given that it is Christmas time.  She states that she walks every day (about 3000 steps) and is working to increase this and exercise.  Plan: -Ozempic 0.25 mg/week -Provider referral exercise program

## 2022-06-03 NOTE — Assessment & Plan Note (Addendum)
The patient presents to the [facility] for follow-up of her right great toe laceration.  Back in August, the patient fell and required stitches for this wound and a course of antibiotics.  She took a week of doxycycline 100 mg twice daily in August and has not required any antibiotics since then.  Today, the patient states that her pain is improved.  She is able to ambulate without much discomfort and wear her shoes without difficulty.  She denies any redness around the area, drainage, fevers, or chills.  Previously, her daughter was helping her clean the wound, but they have not had to do that recently as it has mostly healed.  She did have some increased pain around the area because she moved this over the other day, but this pain was relieved with ibuprofen.  Healing process is likely impaired due to the location of the wound and the patient's history of prediabetes.  At this time, the wound does not appear to be infected.  No need for antibiotics or any further wound care.

## 2022-06-04 NOTE — Progress Notes (Signed)
Internal Medicine Clinic Attending ° °Case discussed with Dr. Atway  At the time of the visit.  We reviewed the resident’s history and exam and pertinent patient test results.  I agree with the assessment, diagnosis, and plan of care documented in the resident’s note.  °

## 2022-06-05 ENCOUNTER — Telehealth: Payer: Self-pay

## 2022-06-05 NOTE — Telephone Encounter (Signed)
Called to discuss PREP program; would like to attend at [facility], will ask Pam RN [facility] to contact her in Jan with next classes scheduled at [facility].

## 2022-06-09 ENCOUNTER — Other Ambulatory Visit: Payer: Self-pay

## 2022-06-11 ENCOUNTER — Other Ambulatory Visit: Payer: Self-pay

## 2022-06-16 ENCOUNTER — Other Ambulatory Visit: Payer: Self-pay

## 2022-06-16 ENCOUNTER — Other Ambulatory Visit (HOSPITAL_COMMUNITY): Payer: Self-pay

## 2022-06-17 ENCOUNTER — Other Ambulatory Visit: Payer: Self-pay

## 2022-06-17 ENCOUNTER — Other Ambulatory Visit (HOSPITAL_COMMUNITY): Payer: Self-pay

## 2022-06-18 ENCOUNTER — Other Ambulatory Visit: Payer: Self-pay

## 2022-06-18 ENCOUNTER — Other Ambulatory Visit (HOSPITAL_COMMUNITY): Payer: Self-pay

## 2022-06-22 DIAGNOSIS — Z419 Encounter for procedure for purposes other than remedying health state, unspecified: Secondary | ICD-10-CM | POA: Diagnosis not present

## 2022-06-24 ENCOUNTER — Other Ambulatory Visit (HOSPITAL_COMMUNITY): Payer: Self-pay

## 2022-07-09 ENCOUNTER — Other Ambulatory Visit (HOSPITAL_COMMUNITY): Payer: Self-pay

## 2022-07-10 ENCOUNTER — Other Ambulatory Visit (HOSPITAL_COMMUNITY): Payer: Self-pay

## 2022-07-14 ENCOUNTER — Other Ambulatory Visit (HOSPITAL_COMMUNITY): Payer: Self-pay

## 2022-07-15 ENCOUNTER — Other Ambulatory Visit (HOSPITAL_COMMUNITY): Payer: Self-pay

## 2022-07-16 ENCOUNTER — Other Ambulatory Visit (HOSPITAL_COMMUNITY): Payer: Self-pay

## 2022-07-17 ENCOUNTER — Telehealth: Payer: Self-pay

## 2022-07-17 NOTE — Telephone Encounter (Signed)
A Prior Authorization was initiated for this patients OZEMPIC through CoverMyMeds.   Key: [id number]

## 2022-07-23 DIAGNOSIS — Z419 Encounter for procedure for purposes other than remedying health state, unspecified: Secondary | ICD-10-CM | POA: Diagnosis not present

## 2022-07-28 ENCOUNTER — Other Ambulatory Visit (HOSPITAL_COMMUNITY): Payer: Self-pay

## 2022-07-28 NOTE — Telephone Encounter (Signed)
Prior Auth for patients medication OZEMPIC denied by [facility] MEDICAID via CoverMyMeds.   Reason: The requested drug is not approved by the Food and Drug Administration (FDA) for the treatment of MORBID SEVERE OBESITY DUE TO [place] in members 59 years of age or older. It is FDA approved for Type 2 diabetes mellitus in members 68 years of age or older.  CoverMyMeds Key: [id number]

## 2022-07-28 NOTE — Telephone Encounter (Signed)
NEW PA STARTED UNDER CORRECTED INSURANCE  KEY: [id number]

## 2022-08-04 ENCOUNTER — Other Ambulatory Visit (HOSPITAL_COMMUNITY): Payer: Self-pay

## 2022-08-04 ENCOUNTER — Ambulatory Visit: Payer: Medicaid Other | Admitting: Internal Medicine

## 2022-08-04 DIAGNOSIS — I10 Essential (primary) hypertension: Secondary | ICD-10-CM

## 2022-08-04 DIAGNOSIS — N951 Menopausal and female climacteric states: Secondary | ICD-10-CM | POA: Diagnosis not present

## 2022-08-04 DIAGNOSIS — M1712 Unilateral primary osteoarthritis, left knee: Secondary | ICD-10-CM

## 2022-08-04 MED ORDER — LISINOPRIL-HYDROCHLOROTHIAZIDE 20-12.5 MG PO TABS
2.0000 | ORAL_TABLET | Freq: Every day | ORAL | 3 refills | Status: DC
  Filled 2022-08-04 – 2022-08-26 (×2): qty 60, 30d supply, fill #0

## 2022-08-04 MED ORDER — VENLAFAXINE HCL ER 75 MG PO CP24
75.0000 mg | ORAL_CAPSULE | Freq: Every day | ORAL | 2 refills | Status: DC
  Filled 2022-08-04 – 2022-08-26 (×2): qty 30, 30d supply, fill #0

## 2022-08-04 NOTE — Assessment & Plan Note (Signed)
Tammie Solis continues to have pain in her left knee.  She notices this most at night and describes a grinding sensation whenever she bends her knee.  She occasionally takes ibuprofen and this helps with pain.  She had a cortisone injection in July 2023 and found this helpful for several months.  She is able to complete ADLs and does not have to modify activity secondary to pain.  Her pain is located to the posterior aspect of knee. A/P: She has previously been evaluated by sports medicine with ultrasound showing small Baker's cyst and prior x-ray showed mild tricompartmental osteoarthritis.  She is interested in another steroid injection. Procedure Note   Indication:  Osteoarthritis    Operators: Drs Amberlee Garvey/Vincent   The patient was provided with risks, benefits, and alternatives to intraarticular injection. He/She consented to intraarticular knee injection for osteoarthritis.  After a time out was preformed, the knee was prepped in a sterile fashion. Cold spray was applied to the skin over the insertion site (lateral inferior patellar aspect of the knee while in the seated position). A mixture of 2 cc 1% lidocaine and 1/2 cc of Kenalog was injected into the knee using a 22 gauge 1 and 1/2 inch needle.  The left  knee space was entered without difficulty, entered successfully after 1 attempt. The patient tolerated the procedures well without complication.

## 2022-08-04 NOTE — Assessment & Plan Note (Signed)
She has been taking venlafaxine 75 mg daily and found this to be helpful. P: Refill sent for venlafaxine 75 mg daily

## 2022-08-04 NOTE — Assessment & Plan Note (Signed)
Pressure initially elevated at 158/69.  Repeat blood pressure well-controlled at 119/50.  She does not record blood pressures at home.  Medications include lisinopril -hydrochlorothiazide 40-25 mg.  Would prefer medications to be sent in 30-day increments as she cannot afford 90 day supply. P: Continue lisinopril HCTZ 40-25 mg daily

## 2022-08-04 NOTE — Patient Instructions (Addendum)
Thank you, [name] for allowing [name] to provide your care today.   Left knee pain I hope that this injection is helpful for some time. The last xray showed you do have osteoarthritis. If you feel that pain is not improved by injection, please come back in. I will call in a few days to check on how you are doing.  Blood pressure  Your blood pressure is well controlled on current medication! I have sent in refills. Please let me know if you would like to switch to 90 days supply at any point.  I have also refilled effexor.  I have ordered the following medication/changed the following medications:   Stop the following medications: Medications Discontinued During This Encounter  Medication Reason   Semaglutide,0.25 or 0.5MG/DOS, 2 MG/3ML SOPN Not covered by the pt's insurance   DIETARY MANAGEMENT PRODUCT PO Not covered by the pt's insurance   lisinopril-hydrochlorothiazide (ZESTORETIC) 20-12.5 MG tablet Reorder     Start the following medications: Meds ordered this encounter  Medications   lisinopril-hydrochlorothiazide (ZESTORETIC) 20-12.5 MG tablet    Sig: Take 2 tablets by mouth daily.    Dispense:  60 tablet    Refill:  3    IM program   venlafaxine XR (EFFEXOR XR) 75 MG 24 hr capsule    Sig: Take 1 capsule (75 mg total) by mouth daily.    Dispense:  30 capsule    Refill:  2     Follow up: 3 months   We look forward to seeing you next time. Please call our clinic at [phone] if you have any questions or concerns. The best time to call is Monday-Friday from 9am-4pm, but there is someone available 24/7. If after hours or the weekend, call the main hospital number and ask for the Internal Medicine Resident On-Call. If you need medication refills, please notify your pharmacy one week in advance and they will send [name] a request.   Thank you for trusting me with your care. Wishing you the best!   Christiana Fuchs, [place]

## 2022-08-04 NOTE — Progress Notes (Signed)
Subjective:  CC: left knee pain  HPI:  Tammie Solis is a 57 y.o. female with a past medical history stated below and presents today for left knee pain.  This has been a chronic issue for the last 2 years.  Pain is located in posterior aspect of knee.  She has been evaluated by sports medicine and ultrasound showed small Baker's cyst.  X-ray in July 2023 showed mild tricompartmental osteoarthritis.  She had a steroid injection in July 2023 and knee pain was improved for several months following this.  She also follows up today for refills on blood pressure medications and Effexor for vasomotor symptoms.  She endorses adherence with lisinopril-hydrochlorothiazide tablet.   Please see problem based assessment and plan for additional details.  Past Medical History:  Diagnosis Date   Allergy    Anemia    Arthritis    knees    Asthma    GERD (gastroesophageal reflux disease)    occasionally    Neuromuscular disorder (HCC)    sciatica left leg    Current Outpatient Medications on File Prior to Visit  Medication Sig Dispense Refill   acetaminophen (TYLENOL) 650 MG CR tablet Take 1 tablet (650 mg total) by mouth every 8 (eight) hours as needed for pain. 60 tablet 0   albuterol (VENTOLIN HFA) 108 (90 Base) MCG/ACT inhaler Inhale 2 puffs into the lungs every 4 (four) hours as needed for wheezing or shortness of breath. 1 each 0   budesonide-formoterol (SYMBICORT) 80-4.5 MCG/ACT inhaler Inhale 2 puffs into the lungs 2 (two) times daily. 10.2 g 12   ibuprofen (ADVIL) 400 MG tablet Take 2 tablets (800 mg total) by mouth every 6 (six) hours as needed for mild pain. 60 tablet 1   Loratadine 10 MG CAPS Take by mouth.     Multiple Vitamins-Minerals (CENTRUM SILVER 50+WOMEN PO) Take 1 each by mouth daily.     Probiotic Product (RA PROBIOTIC GUMMIES PO) Take by mouth daily.     rosuvastatin (CRESTOR) 20 MG tablet Take 1 tablet (20 mg total) by mouth daily. 30 tablet 11   venlafaxine (EFFEXOR)  37.5 MG tablet Take 1 tablet (37.5 mg total) by mouth daily for 7 days, THEN 2 tablets (75 mg total) daily. 67 tablet 0   Current Facility-Administered Medications on File Prior to Visit  Medication Dose Route Frequency Provider Last Rate Last Admin   lidocaine (PF) (XYLOCAINE) 1 % injection 2 mL  2 mL Other Once Lucious Groves, DO       triamcinolone acetonide (KENALOG-40) injection 40 mg  40 mg Intramuscular Once Lucious Groves, DO        Family History  Problem Relation Age of Onset   Diabetes Mother    Kidney failure Mother    Cancer Father        prostate   Prostate cancer Father    Colon cancer Neg Hx    Colon polyps Neg Hx    Esophageal cancer Neg Hx    Rectal cancer Neg Hx    Stomach cancer Neg Hx    Pancreatic cancer Neg Hx     Social History   Socioeconomic History   Marital status: Single    Spouse name: Not on file   Number of children: Not on file   Years of education: Not on file   Highest education level: Not on file  Occupational History   Not on file  Tobacco Use   Smoking status: Never  Smokeless tobacco: Never  Substance and Sexual Activity   Alcohol use: No   Drug use: No   Sexual activity: Never    Birth control/protection: None  Other Topics Concern   Not on file  Social History Narrative   Not on file   Social Determinants of Health   Financial Resource Strain: High Risk (09/29/2021)   Overall Financial Resource Strain (CARDIA)    Difficulty of Paying Living Expenses: Hard  Food Insecurity: No Food Insecurity (06/03/2022)   Hunger Vital Sign    Worried About Running Out of Food in the Last Year: Never true    Ran Out of Food in the Last Year: Never true  Transportation Needs: No Transportation Needs (06/03/2022)   PRAPARE - Hydrologist (Medical): No    Lack of Transportation (Non-Medical): No  Physical Activity: Not on file  Stress: Not on file  Social Connections: Moderately Isolated (06/03/2022)    Social Connection and Isolation Panel [NHANES]    Frequency of Communication with Friends and Family: More than three times a week    Frequency of Social Gatherings with Friends and Family: More than three times a week    Attends Religious Services: Never    Marine scientist or Organizations: Yes    Attends Archivist Meetings: Never    Marital Status: Never married  Intimate Partner Violence: Not At Risk (06/03/2022)   Humiliation, Afraid, Rape, and Kick questionnaire    Fear of Current or Ex-Partner: No    Emotionally Abused: No    Physically Abused: No    Sexually Abused: No    Review of Systems: ROS negative except for what is noted on the assessment and plan.  Objective:   Vitals:   08/04/22 0859 08/04/22 0926  BP: (!) 158/69 (!) 119/50  Pulse: 87 78  Temp: 98 F (36.7 C)   TempSrc: Oral   SpO2: 98%   Weight: 279 lb 8 oz (126.8 kg)   Height: 5' 2"$  (1.575 m)     Physical Exam: Constitutional: well-appearing Cardiovascular: regular rate and rhythm, no m/r/g Pulmonary/Chest: normal work of breathing on room air, lungs clear to auscultation bilaterally MSK: No effusion noted to left knee, tenderness to palpation of posterior aspect of left knee, full extension and flexion of the knee joint with crepitus noted, 5 out of 5 strength in bilateral lower extremity knee flexion, no laxity appreciated with valgus/varus testing, McMurray's negative Skin: warm and dry   Assessment & Plan:  Hypertension Pressure initially elevated at 158/69.  Repeat blood pressure well-controlled at 119/50.  She does not record blood pressures at home.  Medications include lisinopril -hydrochlorothiazide 40-25 mg.  Would prefer medications to be sent in 30-day increments as she cannot afford 90 day supply. P: Continue lisinopril HCTZ 40-25 mg daily  Vasomotor symptoms due to menopause She has been taking venlafaxine 75 mg daily and found this to be helpful. P: Refill sent for  venlafaxine 75 mg daily  Osteoarthritis of left knee Ms. Fahrney continues to have pain in her left knee.  She notices this most at night and describes a grinding sensation whenever she bends her knee.  She occasionally takes ibuprofen and this helps with pain.  She had a cortisone injection in July 2023 and found this helpful for several months.  She is able to complete ADLs and does not have to modify activity secondary to pain.  Her pain is located to the posterior aspect of  knee. A/P: She has previously been evaluated by sports medicine with ultrasound showing small Baker's cyst and prior x-ray showed mild tricompartmental osteoarthritis.  She is interested in another steroid injection. Procedure Note   Indication:  Osteoarthritis    Operators: Drs Luceil Herrin/Vincent   The patient was provided with risks, benefits, and alternatives to intraarticular injection. He/She consented to intraarticular knee injection for osteoarthritis.  After a time out was preformed, the knee was prepped in a sterile fashion. Cold spray was applied to the skin over the insertion site (lateral inferior patellar aspect of the knee while in the seated position). A mixture of 2 cc 1% lidocaine and 1/2 cc of Kenalog was injected into the knee using a 22 gauge 1 and 1/2 inch needle.  The left  knee space was entered without difficulty, entered successfully after 1 attempt. The patient tolerated the procedures well without complication.       Patient discussed with Dr. Jay Schlichter Izzabell Klasen, D.O. [facility]  PGY-2 Pager: [phone]  Phone: [phone] Date 08/04/2022  Time 1:23 PM

## 2022-08-05 NOTE — Progress Notes (Signed)
Internal Medicine Clinic Attending  I saw and evaluated the patient.  I personally confirmed the key portions of the history and exam documented by Dr. Howie Ill and I reviewed pertinent patient test results.  The assessment, diagnosis, and plan were formulated together and I agree with the documentation in the resident's note. I was present for the entirety of the procedure.

## 2022-08-18 ENCOUNTER — Other Ambulatory Visit (HOSPITAL_COMMUNITY): Payer: Self-pay

## 2022-08-21 DIAGNOSIS — Z419 Encounter for procedure for purposes other than remedying health state, unspecified: Secondary | ICD-10-CM | POA: Diagnosis not present

## 2022-08-26 ENCOUNTER — Other Ambulatory Visit (HOSPITAL_COMMUNITY): Payer: Self-pay

## 2022-09-08 NOTE — Progress Notes (Signed)
CC: Shoulder Pain  HPI:   Tammie Solis is a 57 y.o. female with a past medical history of asthma and anemia who presents with right shoulder pain. She was last seen at [facility] on 2-13.   Past Medical History:  Diagnosis Date   Allergy    Anemia    Arthritis    knees    Asthma    GERD (gastroesophageal reflux disease)    occasionally    Neuromuscular disorder (HCC)    sciatica left leg     Review of Systems:    Reports R shoulder pain, hand tingling Denies numbness, pain radiation    Physical Exam:  Vitals:   09/10/22 0949  BP: 138/64  Pulse: 72  Temp: 98.2 F (36.8 C)  TempSrc: Oral  SpO2: 99%  Weight: 280 lb 6.4 oz (127.2 kg)  Height: 5\' 2"  (1.575 m)    General:   awake and alert, sitting comfortably in chair, cooperative, not in acute distress Skin:   warm and dry, intact without any obvious lesions or scars, no rashes Lungs:   normal respiratory effort, breathing unlabored, symmetrical chest rise, no crackles or wheezing Cardiac:   regular rate and rhythm, normal S1 and S2 Musculoskeletal:   full RoM in R shoulder, minimal pain with active movement, tenderness to palpation over anterior aspect of shoulder most prominent over distal pectoralis major, pain reproducible with shoulder abduction-flexion and internal rotation against resistance, empty can test positive on R, Hawkins negative, strength 5/5 throughout BUE, sensation intact and symmetrical across BUE including median-radial-ulnar distributions, Tinel and Phalen tests negative Neurologic:   oriented to person-place-time, moving all extremities, sensation to light touch intact, no gross focal deficits Psychiatric:   euthymic mood with congruent affect, intelligible speech    Assessment & Plan:   Shoulder pain, right Patient developed R shoulder pain that started 4-5 weeks ago while moving large sofa, acute onset while lifting. Describes pain as sharp, constant, and located deep within joint. Affects  both chest and back, former of which hurts more. Since initial injury, the pain has worsened. She has continued to lift weights as part of her normal exercise regimen. About two weeks ago, she started to notice some tingling in her R hand and fingers, denies numbness or radiation of the pain. Worsened by certain movements such as lifting arm above head. Neither oral naproxen nor topical diclofenac have provided any relief. Exam notable for full RoM in R shoulder, minimal pain with active movement. Tenderness to palpation over anterior aspect of shoulder, most prominent over distal pectoralis major. Pain reproducible with shoulder abduction, flexion, and internal rotation against resistance. Empty can test positive on R, Hawkins negative. Strength 5/5 throughout BUE. Sensation intact and symmetrical across BUE including median, radial, ulnar distributions. Tinel and Phalen tests negative. Presentation consistent with muscle strain secondary to acute overloading event, after which healing has been delayed in setting of continued physical activity. Multiple muscles are likely involved including pectoralis major and rotator cuff structures. Low concern for full-thickness tear given full RoM without eliciting significant pain. Neuropathic findings in R hand are curious. Unlikely nerve impingement at shoulder given isolated hand symptoms that stop at wrist. Given negative Phalen and Tinel test, carpal tunnel also unlikely. Plan to treat conservatively by modifying activity level and start physical therapy in two-weeks' time. Prescription for ibuprofen offered, patient prefers instead to purchase this medication herself.  - Avoid strengthening exercises - Start range of motion stretching - Take ibuprofen 800mg  q8 for  two weeks - Apply ice packs in 40min on-off time intervals - Referral to physical therapy starting in two weeks      See Encounters Tab for problem based charting.  Patient discussed with Dr.   Cain Sieve

## 2022-09-10 ENCOUNTER — Ambulatory Visit: Payer: Medicaid Other | Admitting: Student

## 2022-09-10 VITALS — BP 138/64 | HR 72 | Temp 98.2°F | Ht 62.0 in | Wt 280.4 lb

## 2022-09-10 DIAGNOSIS — M25511 Pain in right shoulder: Secondary | ICD-10-CM

## 2022-09-10 NOTE — Assessment & Plan Note (Signed)
Patient developed R shoulder pain that started 4-5 weeks ago while moving large sofa, acute onset while lifting. Describes pain as sharp, constant, and located deep within joint. Affects both chest and back, former of which hurts more. Since initial injury, the pain has worsened. She has continued to lift weights as part of her normal exercise regimen. About two weeks ago, she started to notice some tingling in her R hand and fingers, denies numbness or radiation of the pain. Worsened by certain movements such as lifting arm above head. Neither oral naproxen nor topical diclofenac have provided any relief. Exam notable for full RoM in R shoulder, minimal pain with active movement. Tenderness to palpation over anterior aspect of shoulder, most prominent over distal pectoralis major. Pain reproducible with shoulder abduction, flexion, and internal rotation against resistance. Empty can test positive on R, Hawkins negative. Strength 5/5 throughout BUE. Sensation intact and symmetrical across BUE including median, radial, ulnar distributions. Tinel and Phalen tests negative. Presentation consistent with muscle strain secondary to acute overloading event, after which healing has been delayed in setting of continued physical activity. Multiple muscles are likely involved including pectoralis major and rotator cuff structures. Low concern for full-thickness tear given full RoM without eliciting significant pain. Neuropathic findings in R hand are curious. Unlikely nerve impingement at shoulder given isolated hand symptoms that stop at wrist. Given negative Phalen and Tinel test, carpal tunnel also unlikely. Plan to treat conservatively by modifying activity level and start physical therapy in two-weeks' time. Prescription for ibuprofen offered, patient prefers instead to purchase this medication herself.  - Avoid strengthening exercises - Start range of motion stretching - Take ibuprofen 800mg  q8 for two weeks - Apply  ice packs in 68min on-off time intervals - Referral to physical therapy starting in two weeks

## 2022-09-10 NOTE — Patient Instructions (Addendum)
  Thank you, Ms.Nunzio Cobbs, for allowing [name] to provide your care today. Today we discussed . . .  > Shoulder Pain       - your shoulder pain is likely a strain of one or more muscles       - please perform light range of motion stretching instead of strengthening exercises over the next several weeks       - take ibuprofen 400-800mg  every eight hours for the next two weeks and then gradually stop    I have ordered the following labs for you:  Lab Orders  No laboratory test(s) ordered today      Tests ordered today:  none   Referrals ordered today:   Referral Orders         Ambulatory referral to Physical Therapy        I have ordered the following medication/changed the following medications:   Stop the following medications: There are no discontinued medications.   Start the following medications: No orders of the defined types were placed in this encounter.     Follow up: 3 months    Remember:  Please allow your shoulder to heal and start stretching exercises while avoiding any weight or strengthening training for now. We have placed a referral to physical therapy to start in about two weeks. Take ibuprofen for pain relief. We will see you again in about three months or sooner if you need [name]!   Should you have any questions or concerns please call the internal medicine clinic at [phone].     Roswell Nickel, MD [place]

## 2022-09-11 NOTE — Progress Notes (Signed)
Internal Medicine Clinic Attending  Case discussed with Dr. Harper  At the time of the visit.  We reviewed the resident's history and exam and pertinent patient test results.  I agree with the assessment, diagnosis, and plan of care documented in the resident's note.  

## 2022-09-21 DIAGNOSIS — Z419 Encounter for procedure for purposes other than remedying health state, unspecified: Secondary | ICD-10-CM | POA: Diagnosis not present

## 2022-10-12 NOTE — Therapy (Incomplete)
OUTPATIENT PHYSICAL THERAPY SHOULDER EVALUATION   Patient Name: Tammie Solis MRN: [id number] DOB:[birth year], 57 y.o., female Today's Date: 10/14/2022  END OF SESSION:  PT End of Session - 10/14/22 0837     Visit Number 1    Number of Visits 13    Date for PT Re-Evaluation 11/25/22    Authorization Type MCD [facility]    Authorization Time Period auth tbd    PT Start Time 0841    PT Stop Time 0921    PT Time Calculation (min) 40 min    Activity Tolerance Patient tolerated treatment well;No increased pain    Behavior During Therapy WFL for tasks assessed/performed             Past Medical History:  Diagnosis Date   Allergy    Anemia    Arthritis    knees    Asthma    GERD (gastroesophageal reflux disease)    occasionally    Neuromuscular disorder    sciatica left leg   Past Surgical History:  Procedure Laterality Date   CESAREAN SECTION     x2   Patient Active Problem List   Diagnosis Date Noted   Shoulder pain, right 09/10/2022   Vasomotor symptoms due to menopause 06/03/2022   Prediabetes 06/03/2022   Need for tetanus booster 01/29/2022   Fall 01/29/2022   Foot laceration, right, initial encounter 01/29/2022   Housing instability, currently housed, at risk for homelessness 09/05/2021   Mononeuropathy 09/05/2021   Thyroid nodule 07/05/2021   Polyneuropathy 07/05/2021   Cough 05/29/2021   Asthma 05/29/2021   Superficial phlebitis 03/17/2021   Left leg pain 02/13/2021   Health care maintenance 01/09/2021   Hyperlipidemia 07/30/2020   Osteoarthritis of left knee 07/26/2020   Hypertension 03/12/2020   Osteoarthritis of left hip 03/12/2020   Morbid obesity 03/12/2020   Iron deficiency anemia 03/12/2020    PCP: Rudene Christians, DO  REFERRING PROVIDER: Mercie Eon, MD  REFERRING DIAG: M25.511 (ICD-10-CM) - Acute pain of right shoulder  THERAPY DIAG:  Right shoulder pain, unspecified chronicity  Muscle weakness (generalized)  Stiffness of right  shoulder, not elsewhere classified  Rationale for Evaluation and Treatment: Rehabilitation  ONSET DATE: a couple months ago  SUBJECTIVE:                                                                                                                                                                                      SUBJECTIVE STATEMENT: Pt states a couple of months ago she was trying to move a sofa by herself and feels she strained too hard, doesn't seem to recall any popping at the time. Has difficulty reaching overhead  and sleeping, wakes up nightly due to shoulder pain. States symptoms are stable. Initially had some UE referral and tingling in her fingers but since has localized to shoulder.  Is having to modify her exercise routine, increased pain with dishes/laundry. Describes herself as tending to push through pain to perform activities.  Hand dominance: Right  PERTINENT HISTORY: HTN, asthma  PAIN:  Are you having pain: 5/10 Location/description: R shoulder, anteriorly; achey/throbbing  Best-worst over past week: 5-9/10  - aggravating factors: reaching overhead, lifting - Easing factors: medication, rest  PRECAUTIONS: None  WEIGHT BEARING RESTRICTIONS: No  FALLS:  Has patient fallen in last 6 months? 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  PLOF: Independent  PATIENT GOALS: reduce pain  NEXT MD VISIT: TBD follow up post PT   OBJECTIVE:   DIAGNOSTIC FINDINGS:  No recent shoulder imaging per EPIC or pt   PATIENT SURVEYS:  Quick Dash 56.82  COGNITION: Overall cognitive status: Within functional limits for tasks assessed     SENSATION: LT intact BUE  POSTURE: Forward head posture, B UT elevation  UPPER EXTREMITY ROM:  A/PROM Right eval Left eval  Shoulder flexion 138 * 148 deg  Shoulder abduction 110 deg mild pain 139 deg  Shoulder internal  rotation    Shoulder external rotation    Elbow flexion    Elbow extension    Wrist flexion    Wrist extension     (Blank rows = not tested) (Key: WFL = within functional limits not formally assessed, * = concordant pain, s = stiffness/stretching sensation, NT = not tested)  Comments:    UPPER EXTREMITY MMT:  MMT Right eval Left eval  Shoulder flexion 4 * 5  Shoulder extension 4+ 4+  Shoulder abduction 4* 5  Shoulder internal rotation 3+ 5  Shoulder external rotation 4 * 5  Elbow flexion    Elbow extension    Grip strength    (Blank rows = not tested)  (Key: WFL = within functional limits not formally assessed, * = concordant pain, s = stiffness/stretching sensation, NT = not tested)  Comments:   JOINT MOBILITY TESTING:  deferred  PALPATION:  Significant TTP bicipital groove, anterior deltoid, lateral supraspinatus. Generalized tightness, non tender for remainder of deltoid, LS, and UT    TODAY'S TREATMENT:                                                                                                                                         [facility] Adult PT Treatment:  DATE: 10/14/22 Therapeutic Exercise: Scapular retraction x10 emphasis on reduced UT compensations LS stretch to L 2x30sec HEP handout + education   PATIENT EDUCATION: Education details: Pt education on PT impairments, prognosis, and POC. Informed consent. Rationale for interventions, safe/appropriate HEP performance Person educated: Patient Education method: Explanation, Demonstration, Tactile cues, Verbal cues, and Handouts Education comprehension: verbalized understanding, returned demonstration, verbal cues required, tactile cues required, and needs further education    HOME EXERCISE PROGRAM: Access Code: [email or web address] URL: [email or web address] Date: 10/14/2022 Prepared by: Fransisco Hertz  Exercises - Seated Scapular Retraction  - 1 x daily - 7  x weekly - 3 sets - 10 reps - Gentle Levator Scapulae Stretch  - 1 x daily - 7 x weekly - 1-3 sets - 1-3 reps - 30sec hold  ASSESSMENT:  CLINICAL IMPRESSION: Pt is a pleasant 57 year old woman who arrives to PT evaluation on this date for R shoulder pain. Pt reports difficulty with housework and reaching activities due to pain. During today's session pt demonstrates mild but painful reductions in GH mobility, TTP GH musculature, and L GH weakness, which are limiting ability to perform aforementioned activities. IR musculature significantly weaker than ER and deltoid muscles. Pt tolerates exam and HEP well without any increase in resting pain on departure. Recommend skilled PT to address aforementioned deficits to improve functional independence/tolerance. Pt departs today's session in no acute distress, all voiced questions/concerns addressed appropriately from PT perspective.    OBJECTIVE IMPAIRMENTS: decreased activity tolerance, decreased endurance, decreased mobility, decreased ROM, decreased strength, impaired UE functional use, postural dysfunction, and pain.   ACTIVITY LIMITATIONS: carrying, lifting, sleeping, bathing, dressing, reach over head, and hygiene/grooming  PARTICIPATION LIMITATIONS: meal prep, cleaning, laundry, driving, and shopping  PERSONAL FACTORS: 1-2 comorbidities: HTN, asthma  are also affecting patient's functional outcome.   REHAB POTENTIAL: Good  CLINICAL DECISION MAKING: Stable/uncomplicated  EVALUATION COMPLEXITY: Low   GOALS: Goals reviewed with patient? No  SHORT TERM GOALS: Target date: 11/04/2022 Pt will demonstrate appropriate understanding and performance of initially prescribed HEP in order to facilitate improved independence with management of symptoms.  Baseline: HEP provided on eval Goal status: INITIAL   2. Pt will score less than or equal to 46 on QuickDASH in order to demonstrate improved perception of function due to symptoms.  Baseline:  56.82  Goal status: INITIAL    LONG TERM GOALS: Target date: 11/25/2022 Pt will score less than or equal to 36 on Quick DASH in order to indicate reduced levels of disability due to shoulder pain (MDC 16-20pts).  Baseline: 56.82   Goal status: INITIAL   2.  Pt will demonstrate at least 145 degrees of active R shoulder elevation in order to demonstrate improved tolerance to functional movement patterns such as overhead reaching Baseline: see ROM chart above Goal status: INITIAL  3.  Pt will demonstrate at least 4+/5 shoulder R shoulder ER/IR MMT for improved symmetry of UE strength and improved tolerance to functional movements.  Baseline: see MMT chart above Goal status: INITIAL  4. Pt will report at least 50% reduction in waking due to shoulder pain in order to facilitate improved overall health and QOL.   Baseline: waking nightly due to pain  Goal status: INITIAL   5. Pt will report at least 50% decrease in overall pain levels in past week in order to facilitate improved tolerance to basic ADLs/mobility.   Baseline: 5-9/10 over past week  Goal status: INITIAL    6. Pt will demonstrate appropriate performance of  final prescribed HEP in order to facilitate improved self-management of symptoms post-discharge.   Baseline: initial HEP prescribed  Goal status: INITIAL    7. Pt will be able to lift/carry up to 10# with less than 3pt increase in resting pain in order to facilitate improved tolerance to heavier activities around home such as dishes/laundry.   Baseline: increased pain w/ dishes/laundry  Goal status: INITIAL  PLAN:  PT FREQUENCY: 2x/week  PT DURATION: 6 weeks  PLANNED INTERVENTIONS: Therapeutic exercises, Therapeutic activity, Neuromuscular re-education, Balance training, Gait training, Patient/Family education, Self Care, Joint mobilization, DME instructions, Aquatic Therapy, Dry Needling, Electrical stimulation, Spinal mobilization, Cryotherapy, Moist heat, Taping,  Manual therapy, and Re-evaluation  PLAN FOR NEXT SESSION: Progress ROM/strengthening exercises as able/appropriate, review HEP. Emphasis on periscapular mobility/activation, GH mobility and RC stability   Ashley Murrain PT, DPT 10/14/2022 12:45 PM   Addendum for medicaid Johney Frame PT, DPT 10/14/2022 12:48 PM    Check all possible CPT codes: 16109 - PT Re-evaluation, 97110- Therapeutic Exercise, [phone]- Neuro Re-education, [phone] - Gait Training, [phone] - Manual Therapy, [phone] - Therapeutic Activities, [phone] - Self Care, [phone] - Physical performance training, and [phone] - Aquatic therapy    Check all conditions that are expected to impact treatment: Musculoskeletal disorders  If treatment provided at initial evaluation, no treatment charged due to lack of authorization.

## 2022-10-14 ENCOUNTER — Encounter: Payer: Self-pay | Admitting: Physical Therapy

## 2022-10-14 ENCOUNTER — Other Ambulatory Visit: Payer: Self-pay

## 2022-10-14 ENCOUNTER — Ambulatory Visit: Payer: Medicaid Other | Attending: Internal Medicine | Admitting: Physical Therapy

## 2022-10-14 DIAGNOSIS — M25511 Pain in right shoulder: Secondary | ICD-10-CM | POA: Diagnosis not present

## 2022-10-14 DIAGNOSIS — M25611 Stiffness of right shoulder, not elsewhere classified: Secondary | ICD-10-CM | POA: Insufficient documentation

## 2022-10-14 DIAGNOSIS — M6281 Muscle weakness (generalized): Secondary | ICD-10-CM | POA: Insufficient documentation

## 2022-10-21 DIAGNOSIS — Z419 Encounter for procedure for purposes other than remedying health state, unspecified: Secondary | ICD-10-CM | POA: Diagnosis not present

## 2022-10-26 ENCOUNTER — Ambulatory Visit: Payer: Medicaid Other | Admitting: Physical Therapy

## 2022-10-28 NOTE — Therapy (Signed)
OUTPATIENT PHYSICAL THERAPY TREATMENT NOTE   Patient Name: Tammie Solis MRN: [id number] DOB:January 02, 1966, 57 y.o., female Today's Date: 10/29/2022  PCP: Rudene Christians, DO REFERRING PROVIDER: Mercie Eon, MD  END OF SESSION:   PT End of Session - 10/29/22 1540     Visit Number 2    Number of Visits 13    Date for PT Re-Evaluation 11/25/22    Authorization Type MCD [facility]    Authorization Time Period 10/26/22-12/25/22    Authorization - Visit Number 1    Authorization - Number of Visits 10    PT Start Time 1542    PT Stop Time 1624    PT Time Calculation (min) 42 min    Activity Tolerance Patient tolerated treatment well;No increased pain    Behavior During Therapy WFL for tasks assessed/performed             Past Medical History:  Diagnosis Date   Allergy    Anemia    Arthritis    knees    Asthma    GERD (gastroesophageal reflux disease)    occasionally    Neuromuscular disorder (HCC)    sciatica left leg   Past Surgical History:  Procedure Laterality Date   CESAREAN SECTION     x2   Patient Active Problem List   Diagnosis Date Noted   Shoulder pain, right 09/10/2022   Vasomotor symptoms due to menopause 06/03/2022   Prediabetes 06/03/2022   Need for tetanus booster 01/29/2022   Fall 01/29/2022   Foot laceration, right, initial encounter 01/29/2022   Housing instability, currently housed, at risk for homelessness 09/05/2021   Mononeuropathy 09/05/2021   Thyroid nodule 07/05/2021   Polyneuropathy 07/05/2021   Cough 05/29/2021   Asthma 05/29/2021   Superficial phlebitis 03/17/2021   Left leg pain 02/13/2021   Health care maintenance 01/09/2021   Hyperlipidemia 07/30/2020   Osteoarthritis of left knee 07/26/2020   Hypertension 03/12/2020   Osteoarthritis of left hip 03/12/2020   Morbid obesity (HCC) 03/12/2020   Iron deficiency anemia 03/12/2020    REFERRING DIAG: M25.511 (ICD-10-CM) - Acute pain of right shoulder  THERAPY DIAG:  Right  shoulder pain, unspecified chronicity  Muscle weakness (generalized)  Stiffness of right shoulder, not elsewhere classified  Rationale for Evaluation and Treatment Rehabilitation  PERTINENT HISTORY: HTN, asthma   PRECAUTIONS: none  SUBJECTIVE:                                                                                                                                                                                     Per eval - Pt states a couple of months ago she was trying to move a sofa by  herself and feels she strained too hard, doesn't seem to recall any popping at the time. Has difficulty reaching overhead and sleeping, wakes up nightly due to shoulder pain. States symptoms are stable. Initially had some UE referral and tingling in her fingers but since has localized to shoulder.  Is having to modify her exercise routine, increased pain with dishes/laundry. Describes herself as tending to push through pain to perform activities.  Hand dominance: Right  SUBJECTIVE STATEMENT:   States exercises have been going well, not much change. Pain still fluctuating with weather and activity.  Does note she has been trying to use shoulder more, has intermittent clicking/popping  PAIN:  Are you having pain: "not much" Location/description: R shoulder, anteriorly; achey/throbbing  Best-worst over past week: 5-9/10  - aggravating factors: reaching overhead, lifting - Easing factors: medication, rest   OBJECTIVE: (objective measures completed at initial evaluation unless otherwise dated)   DIAGNOSTIC FINDINGS:  No recent shoulder imaging per EPIC or pt    PATIENT SURVEYS:  Quick Dash 56.82   COGNITION: Overall cognitive status: Within functional limits for tasks assessed                                  SENSATION: LT intact BUE   POSTURE: Forward head posture, B UT elevation   UPPER EXTREMITY ROM:   A/PROM Right eval Left eval R 10/29/22  Shoulder flexion 138 * 148 deg  149deg *  Shoulder abduction 110 deg mild pain 139 deg 122 deg *   Shoulder internal rotation       Shoulder external rotation       Elbow flexion       Elbow extension       Wrist flexion       Wrist extension        (Blank rows = not tested) (Key: WFL = within functional limits not formally assessed, * = concordant pain, s = stiffness/stretching sensation, NT = not tested)  Comments:     UPPER EXTREMITY MMT:   MMT Right eval Left eval  Shoulder flexion 4 * 5  Shoulder extension 4+ 4+  Shoulder abduction 4* 5  Shoulder internal rotation 3+ 5  Shoulder external rotation 4 * 5  Elbow flexion      Elbow extension      Grip strength      (Blank rows = not tested)  (Key: WFL = within functional limits not formally assessed, * = concordant pain, s = stiffness/stretching sensation, NT = not tested)  Comments:    JOINT MOBILITY TESTING:  deferred   PALPATION:  Significant TTP bicipital groove, anterior deltoid, lateral supraspinatus. Generalized tightness, non tender for remainder of deltoid, LS, and UT              TODAY'S TREATMENT:  [facility] Adult PT Treatment:                                                DATE: 10/29/22 Therapeutic Exercise: Scap retractions x12 for HEP review LS stretch 1x BIL for HEP review  GTB row 2x10 cues for hand positioning, reduced UT activation GTB shoulder ext 2x10 cues for reduced UT compensations  GH abduction iso 2x5 w 5 sec hold cues for setup and reduced compensations GH ER iso x5, x10 w 3 sec hold cues for setup and appropriate force output Shoulder flexion iso x10 w 3 sec hold  Standing bicep curl RTB 2x12 cues for form and upright posture  Standing GH flexion pillow case at wall 2x8 cues for comfortable ROM HEP update + education    [facility] Adult PT Treatment:                                                 DATE: 10/14/22 Therapeutic Exercise: Scapular retraction x10 emphasis on reduced UT compensations LS stretch to L 2x30sec HEP handout + education     PATIENT EDUCATION: Education details: rationale for interventions, HEP, relevant anatomy/physiology Person educated: Patient Education method: Explanation, Demonstration, Tactile cues, Verbal cues, and Handouts Education comprehension: verbalized understanding, returned demonstration, verbal cues required, tactile cues required, and needs further education     HOME EXERCISE PROGRAM: Access Code: [email or web address] URL: [email or web address] Date: 10/29/2022 Prepared by: Fransisco Hertz  Exercises - Seated Scapular Retraction  - 1 x daily - 7 x weekly - 3 sets - 10 reps - Gentle Levator Scapulae Stretch  - 1 x daily - 7 x weekly - 1-3 sets - 1-3 reps - 30sec hold - Standing Shoulder Row with Anchored Resistance  - 1 x daily - 7 x weekly - 2 sets - 10 reps - Standing Single Arm Elbow Flexion with Resistance  - 1 x daily - 7 x weekly - 2 sets - 10 reps ASSESSMENT:   CLINICAL IMPRESSION: 10/29/2022 Pt arrives w/o significant pain but states her symptoms continue to fluctuate based on activity and weather. HEP review goes well today.  Pt demos notable improvements in GH ROM compared to initial eval although continues with pain and mild limitations. Introduction of resisted periscapular strengthening today and GH isometrics with good tolerance, mild transient pain during isometrics but no increase in resting pain. HEP updated to include biceps training and rows, education on appropriate performance. Denies any increase in pain on departure, no adverse events. Pt departs today's session in no acute distress, all voiced questions/concerns addressed appropriately from PT perspective.     Per eval - Pt is a pleasant 57 year old woman who arrives to PT evaluation on this date for R shoulder pain. Pt reports difficulty with housework and reaching  activities due to pain. During today's session pt demonstrates mild but painful reductions in GH mobility, TTP GH musculature, and L GH weakness, which are limiting ability to perform aforementioned activities. IR musculature significantly weaker than ER and deltoid muscles. Pt tolerates exam and HEP well without any increase in resting pain on departure. Recommend skilled PT to address aforementioned deficits to improve functional independence/tolerance. Pt departs today's session in no acute distress, all voiced  questions/concerns addressed appropriately from PT perspective.     OBJECTIVE IMPAIRMENTS: decreased activity tolerance, decreased endurance, decreased mobility, decreased ROM, decreased strength, impaired UE functional use, postural dysfunction, and pain.    ACTIVITY LIMITATIONS: carrying, lifting, sleeping, bathing, dressing, reach over head, and hygiene/grooming   PARTICIPATION LIMITATIONS: meal prep, cleaning, laundry, driving, and shopping   PERSONAL FACTORS: 1-2 comorbidities: HTN, asthma  are also affecting patient's functional outcome.    REHAB POTENTIAL: Good   CLINICAL DECISION MAKING: Stable/uncomplicated   EVALUATION COMPLEXITY: Low     GOALS: Goals reviewed with patient? No   SHORT TERM GOALS: Target date: 11/04/2022 Pt will demonstrate appropriate understanding and performance of initially prescribed HEP in order to facilitate improved independence with management of symptoms.  Baseline: HEP provided on eval Goal status: INITIAL    2. Pt will score less than or equal to 46 on QuickDASH in order to demonstrate improved perception of function due to symptoms.            Baseline: 56.82            Goal status: INITIAL     LONG TERM GOALS: Target date: 11/25/2022 Pt will score less than or equal to 36 on Quick DASH in order to indicate reduced levels of disability due to shoulder pain (MDC 16-20pts).            Baseline: 56.82             Goal status: INITIAL    2.   Pt will demonstrate at least 145 degrees of active R shoulder elevation in order to demonstrate improved tolerance to functional movement patterns such as overhead reaching Baseline: see ROM chart above Goal status: INITIAL   3.  Pt will demonstrate at least 4+/5 shoulder R shoulder ER/IR MMT for improved symmetry of UE strength and improved tolerance to functional movements.  Baseline: see MMT chart above Goal status: INITIAL   4. Pt will report at least 50% reduction in waking due to shoulder pain in order to facilitate improved overall health and QOL.             Baseline: waking nightly due to pain            Goal status: INITIAL    5. Pt will report at least 50% decrease in overall pain levels in past week in order to facilitate improved tolerance to basic ADLs/mobility.             Baseline: 5-9/10 over past week            Goal status: INITIAL     6. Pt will demonstrate appropriate performance of final prescribed HEP in order to facilitate improved self-management of symptoms post-discharge.             Baseline: initial HEP prescribed            Goal status: INITIAL     7. Pt will be able to lift/carry up to 10# with less than 3pt increase in resting pain in order to facilitate improved tolerance to heavier activities around home such as dishes/laundry.             Baseline: increased pain w/ dishes/laundry            Goal status: INITIAL   PLAN:   PT FREQUENCY: 2x/week   PT DURATION: 6 weeks   PLANNED INTERVENTIONS: Therapeutic exercises, Therapeutic activity, Neuromuscular re-education, Balance training, Gait training, Patient/Family education, Self Care, Joint mobilization, DME  instructions, Aquatic Therapy, Dry Needling, Electrical stimulation, Spinal mobilization, Cryotherapy, Moist heat, Taping, Manual therapy, and Re-evaluation   PLAN FOR NEXT SESSION: Progress ROM/strengthening exercises as able/appropriate, review HEP. Emphasis on periscapular mobility/activation, GH  mobility and RC stability   Ashley Murrain PT, DPT 10/29/2022 4:34 PM

## 2022-10-29 ENCOUNTER — Encounter: Payer: Self-pay | Admitting: Physical Therapy

## 2022-10-29 ENCOUNTER — Ambulatory Visit: Payer: Medicaid Other | Attending: Internal Medicine | Admitting: Physical Therapy

## 2022-10-29 DIAGNOSIS — M25511 Pain in right shoulder: Secondary | ICD-10-CM | POA: Insufficient documentation

## 2022-10-29 DIAGNOSIS — M6281 Muscle weakness (generalized): Secondary | ICD-10-CM | POA: Insufficient documentation

## 2022-10-29 DIAGNOSIS — M25611 Stiffness of right shoulder, not elsewhere classified: Secondary | ICD-10-CM

## 2022-11-02 ENCOUNTER — Ambulatory Visit: Payer: Medicaid Other | Admitting: Physical Therapy

## 2022-11-02 ENCOUNTER — Encounter: Payer: Self-pay | Admitting: Physical Therapy

## 2022-11-02 DIAGNOSIS — M25611 Stiffness of right shoulder, not elsewhere classified: Secondary | ICD-10-CM | POA: Diagnosis not present

## 2022-11-02 DIAGNOSIS — M25511 Pain in right shoulder: Secondary | ICD-10-CM

## 2022-11-02 DIAGNOSIS — M6281 Muscle weakness (generalized): Secondary | ICD-10-CM | POA: Diagnosis not present

## 2022-11-02 NOTE — Therapy (Signed)
OUTPATIENT PHYSICAL THERAPY TREATMENT NOTE   Patient Name: Tammie Solis MRN: [id number] DOB:05/04/1966, 57 y.o., female Today's Date: 11/02/2022  PCP: Rudene Christians, DO REFERRING PROVIDER: Mercie Eon, MD  END OF SESSION:   PT End of Session - 11/02/22 1049     Visit Number 3    Number of Visits 13    Date for PT Re-Evaluation 11/25/22    Authorization Type [facility]    Authorization Time Period 10/26/22-12/25/22    Authorization - Visit Number 2    Authorization - Number of Visits 10    PT Start Time 1053    PT Stop Time 1134    PT Time Calculation (min) 41 min    Activity Tolerance Patient tolerated treatment well    Behavior During Therapy WFL for tasks assessed/performed              Past Medical History:  Diagnosis Date   Allergy    Anemia    Arthritis    knees    Asthma    GERD (gastroesophageal reflux disease)    occasionally    Neuromuscular disorder (HCC)    sciatica left leg   Past Surgical History:  Procedure Laterality Date   CESAREAN SECTION     x2   Patient Active Problem List   Diagnosis Date Noted   Shoulder pain, right 09/10/2022   Vasomotor symptoms due to menopause 06/03/2022   Prediabetes 06/03/2022   Need for tetanus booster 01/29/2022   Fall 01/29/2022   Foot laceration, right, initial encounter 01/29/2022   Housing instability, currently housed, at risk for homelessness 09/05/2021   Mononeuropathy 09/05/2021   Thyroid nodule 07/05/2021   Polyneuropathy 07/05/2021   Cough 05/29/2021   Asthma 05/29/2021   Superficial phlebitis 03/17/2021   Left leg pain 02/13/2021   Health care maintenance 01/09/2021   Hyperlipidemia 07/30/2020   Osteoarthritis of left knee 07/26/2020   Hypertension 03/12/2020   Osteoarthritis of left hip 03/12/2020   Morbid obesity (HCC) 03/12/2020   Iron deficiency anemia 03/12/2020    REFERRING DIAG: M25.511 (ICD-10-CM) - Acute pain of right shoulder  THERAPY DIAG:  Right shoulder pain,  unspecified chronicity  Muscle weakness (generalized)  Stiffness of right shoulder, not elsewhere classified  Rationale for Evaluation and Treatment Rehabilitation  PERTINENT HISTORY: HTN, asthma   PRECAUTIONS: none  SUBJECTIVE:                                                                                                                                                                                     Per eval - Pt states a couple of months ago she was trying to move a sofa by herself  and feels she strained too hard, doesn't seem to recall any popping at the time. Has difficulty reaching overhead and sleeping, wakes up nightly due to shoulder pain. States symptoms are stable. Initially had some UE referral and tingling in her fingers but since has localized to shoulder.  Is having to modify her exercise routine, increased pain with dishes/laundry. Describes herself as tending to push through pain to perform activities.  Hand dominance: Right  SUBJECTIVE STATEMENT:   Pt arrives without pain. No issues after last session, HEP going well    PAIN:  Are you having pain: none Location/description: R shoulder, anteriorly; achey/throbbing  Best-worst over past week: 5-9/10  - aggravating factors: reaching overhead, lifting - Easing factors: medication, rest   OBJECTIVE: (objective measures completed at initial evaluation unless otherwise dated)   DIAGNOSTIC FINDINGS:  No recent shoulder imaging per EPIC or pt    PATIENT SURVEYS:  Quick Dash 56.82 QuickDASH 11/02/22: 29.54    COGNITION: Overall cognitive status: Within functional limits for tasks assessed                                  SENSATION: LT intact BUE   POSTURE: Forward head posture, B UT elevation   UPPER EXTREMITY ROM:   A/PROM Right eval Left eval R 10/29/22  Shoulder flexion 138 * 148 deg 149deg *  Shoulder abduction 110 deg mild pain 139 deg 122 deg *   Shoulder internal rotation       Shoulder  external rotation       Elbow flexion       Elbow extension       Wrist flexion       Wrist extension        (Blank rows = not tested) (Key: WFL = within functional limits not formally assessed, * = concordant pain, s = stiffness/stretching sensation, NT = not tested)  Comments:     UPPER EXTREMITY MMT:   MMT Right eval Left eval  Shoulder flexion 4 * 5  Shoulder extension 4+ 4+  Shoulder abduction 4* 5  Shoulder internal rotation 3+ 5  Shoulder external rotation 4 * 5  Elbow flexion      Elbow extension      Grip strength      (Blank rows = not tested)  (Key: WFL = within functional limits not formally assessed, * = concordant pain, s = stiffness/stretching sensation, NT = not tested)  Comments:    JOINT MOBILITY TESTING:  deferred   PALPATION:  Significant TTP bicipital groove, anterior deltoid, lateral supraspinatus. Generalized tightness, non tender for remainder of deltoid, LS, and UT              TODAY'S TREATMENT:  [facility] Adult PT Treatment:                                                DATE: 11/02/22 Therapeutic Exercise: GTB row 2x12 cues for reduced compensations at elbow  GTB shoulder ext 2x12 cues for posture  Superset 1 RTB GH ER x10, x12 cues for setup and posture, reduced compensations at elbow  Superset 2 RTB GH IR x10, x12 cues for elbow positioning  Standing swiss ball press down 2x15 emphasis on lat isometric cues for breath control and form  RTB triceps extension 2x10 cues for setup Swiss ball up wall GH flexion 3x5 cues for comfortable ROM  Median nerve glides x10 RUE cues for symptom monitoring and appropriate performance    [facility] Adult PT Treatment:                                                DATE: 10/29/22 Therapeutic Exercise: Scap retractions x12 for HEP review LS stretch 1x BIL for HEP review  GTB row 2x10  cues for hand positioning, reduced UT activation GTB shoulder ext 2x10 cues for reduced UT compensations  GH abduction iso 2x5 w 5 sec hold cues for setup and reduced compensations GH ER iso x5, x10 w 3 sec hold cues for setup and appropriate force output Shoulder flexion iso x10 w 3 sec hold  Standing bicep curl RTB 2x12 cues for form and upright posture  Standing GH flexion pillow case at wall 2x8 cues for comfortable ROM HEP update + education    [facility] Adult PT Treatment:                                                DATE: 10/14/22 Therapeutic Exercise: Scapular retraction x10 emphasis on reduced UT compensations LS stretch to L 2x30sec HEP handout + education     PATIENT EDUCATION: Education details: rationale for interventions, HEP, relevant anatomy/physiology Person educated: Patient Education method: Explanation, Demonstration, Tactile cues, Verbal cues, and Handouts Education comprehension: verbalized understanding, returned demonstration, verbal cues required, tactile cues required, and needs further education     HOME EXERCISE PROGRAM: Access Code: [email or web address] URL: [email or web address] Date: 10/29/2022 Prepared by: Fransisco Hertz  Exercises - Seated Scapular Retraction  - 1 x daily - 7 x weekly - 3 sets - 10 reps - Gentle Levator Scapulae Stretch  - 1 x daily - 7 x weekly - 1-3 sets - 1-3 reps - 30sec hold - Standing Shoulder Row with Anchored Resistance  - 1 x daily - 7 x weekly - 2 sets - 10 reps - Standing Single Arm Elbow Flexion with Resistance  - 1 x daily - 7 x weekly - 2 sets - 10 reps ASSESSMENT:   CLINICAL IMPRESSION: 11/02/2022 Pt arrives w/o pain, states she continues to do about the same overall, no issues after last session. Today able to progress for increased volume with periscapular/GH program without increase in pain, pt reports mild muscular fatigue/strain. Also able to progress for addition of closed chain GH/periscapular work which pt  tolerates well overall. Pt does endorse some mild  fingertip tingling in RUE after GH flexion stretches at end of session which resolve after provision of median nerve glides. No adverse events - pt denies any increases in pain during session but on departure she does endorse some mild-moderate lateral shoulder pain on RUE. Recommend continuing along current POC in order to address relevant deficits and improve functional tolerance. Pt departs today's session in no acute distress, all voiced questions/concerns addressed appropriately from PT perspective.     Per eval - Pt is a pleasant 57 year old woman who arrives to PT evaluation on this date for R shoulder pain. Pt reports difficulty with housework and reaching activities due to pain. During today's session pt demonstrates mild but painful reductions in GH mobility, TTP GH musculature, and L GH weakness, which are limiting ability to perform aforementioned activities. IR musculature significantly weaker than ER and deltoid muscles. Pt tolerates exam and HEP well without any increase in resting pain on departure. Recommend skilled PT to address aforementioned deficits to improve functional independence/tolerance. Pt departs today's session in no acute distress, all voiced questions/concerns addressed appropriately from PT perspective.     OBJECTIVE IMPAIRMENTS: decreased activity tolerance, decreased endurance, decreased mobility, decreased ROM, decreased strength, impaired UE functional use, postural dysfunction, and pain.    ACTIVITY LIMITATIONS: carrying, lifting, sleeping, bathing, dressing, reach over head, and hygiene/grooming   PARTICIPATION LIMITATIONS: meal prep, cleaning, laundry, driving, and shopping   PERSONAL FACTORS: 1-2 comorbidities: HTN, asthma  are also affecting patient's functional outcome.    REHAB POTENTIAL: Good   CLINICAL DECISION MAKING: Stable/uncomplicated   EVALUATION COMPLEXITY: Low     GOALS: Goals reviewed with  patient? No   SHORT TERM GOALS: Target date: 11/04/2022 Pt will demonstrate appropriate understanding and performance of initially prescribed HEP in order to facilitate improved independence with management of symptoms.  Baseline: HEP provided on eval 11/02/22: pt endorses good compliance with HEP, no issues  Goal status: MET   2. Pt will score less than or equal to 46 on QuickDASH in order to demonstrate improved perception of function due to symptoms.            Baseline: 56.82  11/02/22: 29.54             Goal status: MET   LONG TERM GOALS: Target date: 11/25/2022 Pt will score less than or equal to 36 on Quick DASH in order to indicate reduced levels of disability due to shoulder pain ([facility] 16-20pts).            Baseline: 56.82   11/02/22: 29.54            Goal status: MET   2.  Pt will demonstrate at least 145 degrees of active R shoulder elevation in order to demonstrate improved tolerance to functional movement patterns such as overhead reaching Baseline: see ROM chart above Goal status: INITIAL   3.  Pt will demonstrate at least 4+/5 shoulder R shoulder ER/IR MMT for improved symmetry of UE strength and improved tolerance to functional movements.  Baseline: see MMT chart above Goal status: INITIAL   4. Pt will report at least 50% reduction in waking due to shoulder pain in order to facilitate improved overall health and QOL.             Baseline: waking nightly due to pain            Goal status: INITIAL    5. Pt will report at least 50% decrease in overall pain levels  in past week in order to facilitate improved tolerance to basic ADLs/mobility.             Baseline: 5-9/10 over past week            Goal status: INITIAL     6. Pt will demonstrate appropriate performance of final prescribed HEP in order to facilitate improved self-management of symptoms post-discharge.             Baseline: initial HEP prescribed            Goal status: INITIAL     7. Pt will be able to  lift/carry up to 10# with less than 3pt increase in resting pain in order to facilitate improved tolerance to heavier activities around home such as dishes/laundry.             Baseline: increased pain w/ dishes/laundry            Goal status: INITIAL   PLAN:   PT FREQUENCY: 2x/week   PT DURATION: 6 weeks   PLANNED INTERVENTIONS: Therapeutic exercises, Therapeutic activity, Neuromuscular re-education, Balance training, Gait training, Patient/Family education, Self Care, Joint mobilization, DME instructions, Aquatic Therapy, Dry Needling, Electrical stimulation, Spinal mobilization, Cryotherapy, Moist heat, Taping, Manual therapy, and Re-evaluation   PLAN FOR NEXT SESSION: Progress ROM/strengthening exercises as able/appropriate, review HEP. Emphasis on periscapular mobility/activation, GH mobility and RC stability   Ashley Murrain PT, DPT 11/02/2022 12:36 PM

## 2022-11-04 ENCOUNTER — Telehealth: Payer: Self-pay

## 2022-11-04 NOTE — Telephone Encounter (Signed)
Call to pt reference PREP class.  Currently in Therapy for rotator cuff inj.  Is interested. Given my number for call back when she finishes therapy. Pt agreeable to plan.

## 2022-11-05 ENCOUNTER — Ambulatory Visit: Payer: Medicaid Other | Admitting: Physical Therapy

## 2022-11-05 ENCOUNTER — Encounter: Payer: Self-pay | Admitting: Physical Therapy

## 2022-11-05 DIAGNOSIS — M25511 Pain in right shoulder: Secondary | ICD-10-CM

## 2022-11-05 DIAGNOSIS — M6281 Muscle weakness (generalized): Secondary | ICD-10-CM

## 2022-11-05 DIAGNOSIS — M25611 Stiffness of right shoulder, not elsewhere classified: Secondary | ICD-10-CM | POA: Diagnosis not present

## 2022-11-05 NOTE — Therapy (Signed)
OUTPATIENT PHYSICAL THERAPY TREATMENT NOTE   Patient Name: Tammie Solis MRN: [id number] DOB:04-03-1966, 57 y.o., female Today's Date: 11/05/2022  PCP: Rudene Christians, DO REFERRING PROVIDER: Mercie Eon, MD  END OF SESSION:   PT End of Session - 11/05/22 1045     Visit Number 4    Number of Visits 13    Date for PT Re-Evaluation 11/25/22    Authorization Type [facility]    Authorization Time Period 10/26/22-12/25/22    Authorization - Visit Number 3    Authorization - Number of Visits 10    PT Start Time 1047    PT Stop Time 1132    PT Time Calculation (min) 45 min    Activity Tolerance Patient tolerated treatment well    Behavior During Therapy [facility] for tasks assessed/performed               Past Medical History:  Diagnosis Date   Allergy    Anemia    Arthritis    knees    Asthma    GERD (gastroesophageal reflux disease)    occasionally    Neuromuscular disorder (HCC)    sciatica left leg   Past Surgical History:  Procedure Laterality Date   CESAREAN SECTION     x2   Patient Active Problem List   Diagnosis Date Noted   Shoulder pain, right 09/10/2022   Vasomotor symptoms due to menopause 06/03/2022   Prediabetes 06/03/2022   Need for tetanus booster 01/29/2022   Fall 01/29/2022   Foot laceration, right, initial encounter 01/29/2022   Housing instability, currently housed, at risk for homelessness 09/05/2021   Mononeuropathy 09/05/2021   Thyroid nodule 07/05/2021   Polyneuropathy 07/05/2021   Cough 05/29/2021   Asthma 05/29/2021   Superficial phlebitis 03/17/2021   Left leg pain 02/13/2021   Health care maintenance 01/09/2021   Hyperlipidemia 07/30/2020   Osteoarthritis of left knee 07/26/2020   Hypertension 03/12/2020   Osteoarthritis of left hip 03/12/2020   Morbid obesity (HCC) 03/12/2020   Iron deficiency anemia 03/12/2020    REFERRING DIAG: M25.511 (ICD-10-CM) - Acute pain of right shoulder  THERAPY DIAG:  Right shoulder pain,  unspecified chronicity  Muscle weakness (generalized)  Rationale for Evaluation and Treatment Rehabilitation  PERTINENT HISTORY: HTN, asthma   PRECAUTIONS: none  SUBJECTIVE STATEMENT:   " Not bad, except for when it rains. The rain brings on the aches and pains."  Hand dominance: Right  PAIN:  Are you having pain: none Location/description: R shoulder, anteriorly; achey/throbbing  Best-worst over past week: 5-9/10  - aggravating factors: reaching overhead, lifting - Easing factors: medication, rest   OBJECTIVE: (objective measures completed at initial evaluation unless otherwise dated)   DIAGNOSTIC FINDINGS:  No recent shoulder imaging per EPIC or pt    PATIENT SURVEYS:  Quick Dash 56.82 QuickDASH 11/02/22: 29.54    COGNITION: Overall cognitive status: Within functional limits for tasks assessed                                  SENSATION: LT intact BUE   POSTURE: Forward head posture, B UT elevation   UPPER EXTREMITY ROM:   A/PROM Right eval Left eval R 10/29/22  Shoulder flexion 138 * 148 deg 149deg *  Shoulder abduction 110 deg mild pain 139 deg 122 deg *   Shoulder internal rotation       Shoulder external rotation       Elbow  flexion       Elbow extension       Wrist flexion       Wrist extension        (Blank rows = not tested) (Key: WFL = within functional limits not formally assessed, * = concordant pain, s = stiffness/stretching sensation, NT = not tested)  Comments:     UPPER EXTREMITY MMT:   MMT Right eval Left eval  Shoulder flexion 4 * 5  Shoulder extension 4+ 4+  Shoulder abduction 4* 5  Shoulder internal rotation 3+ 5  Shoulder external rotation 4 * 5  Elbow flexion      Elbow extension      Grip strength      (Blank rows = not tested)  (Key: WFL = within functional limits not formally assessed, * = concordant pain, s = stiffness/stretching sensation, NT = not tested)  Comments:    JOINT MOBILITY TESTING:  deferred   PALPATION:   Significant TTP bicipital groove, anterior deltoid, lateral supraspinatus. Generalized tightness, non tender for remainder of deltoid, LS, and UT              TODAY'S TREATMENT:                                                                                                                                          [facility] Adult PT Treatment:                                                DATE: 11/05/2022 Therapeutic Exercise: UBE L3 x 4 min ( fwd/ bwd x 2 min ea) Doorway pec stretch 2 x 30 sec Lower trap strengthening standing against wall doing y's 2 x 12 Rows 2 x 12 with GTB Scapular retraction with ER 2 x 12 with gTB Shoulder scaption standing 2 x 15 unweighted with focus on eccentric lowering  Updated HEP today for pec stretch, wall ys and wall push up with plus Manual Therapy: MTPR along R upper trap / scalenes x 2 and pec minor x 2 Distal clavicle mobs grade III inferior/ posterior    [facility] Adult PT Treatment:                                                DATE: 11/02/22 Therapeutic Exercise: GTB row 2x12 cues for reduced compensations at elbow  GTB shoulder ext 2x12 cues for posture  Superset 1 RTB GH ER x10, x12 cues for setup and posture, reduced compensations at elbow  Superset 2 RTB GH IR x10, x12 cues for elbow positioning  Standing swiss ball press down 2x15 emphasis  on lat isometric cues for breath control and form  RTB triceps extension 2x10 cues for setup Swiss ball up wall GH flexion 3x5 cues for comfortable ROM  Median nerve glides x10 RUE cues for symptom monitoring and appropriate performance    [facility] Adult PT Treatment:                                                DATE: 10/29/22 Therapeutic Exercise: Scap retractions x12 for HEP review LS stretch 1x BIL for HEP review  GTB row 2x10 cues for hand positioning, reduced UT activation GTB shoulder ext 2x10 cues for reduced UT compensations  GH abduction iso 2x5 w 5 sec hold cues for setup and reduced compensations GH  ER iso x5, x10 w 3 sec hold cues for setup and appropriate force output Shoulder flexion iso x10 w 3 sec hold  Standing bicep curl RTB 2x12 cues for form and upright posture  Standing GH flexion pillow case at wall 2x8 cues for comfortable ROM HEP update + education    PATIENT EDUCATION: Education details: rationale for interventions, HEP, relevant anatomy/physiology Person educated: Patient Education method: Explanation, Demonstration, Tactile cues, Verbal cues, and Handouts Education comprehension: verbalized understanding, returned demonstration, verbal cues required, tactile cues required, and needs further education     HOME EXERCISE PROGRAM: Access Code: [email or web address] URL: [email or web address] Date: 11/05/2022 Prepared by: Lulu Riding  Exercises - Seated Scapular Retraction  - 1 x daily - 7 x weekly - 3 sets - 10 reps - Gentle Levator Scapulae Stretch  - 1 x daily - 7 x weekly - 1-3 sets - 1-3 reps - 30sec hold - Standing Shoulder Row with Anchored Resistance  - 1 x daily - 7 x weekly - 2 sets - 10 reps - Standing Single Arm Elbow Flexion with Resistance  - 1 x daily - 7 x weekly - 2 sets - 10 reps - Doorway Pec Stretch at 90 Degrees Abduction  - 2-3 x daily - 7 x weekly - 2 sets - 1-2 reps - 30 seconds hold - Lower Trap Wall slides  - 1 x daily - 7 x weekly - 2 sets - 10 reps - Wall Push Up with Plus  - 1 x daily - 7 x weekly - 2 sets - 10 reps - 5 hold  ASSESSMENT:   CLINICAL IMPRESSION: 11/05/2022 Tammie Solis arrives to session continuing to report no pain during the session but reports she feels the pain mostly when reaching overhead. Worked on reducing RUE trigger points followed with distal clavicle mobs. Continued working on posterior shoulder strengthening with emphasis on promoting scapulohumeral rhythm.    Per eval - Pt is a pleasant 57 year old woman who arrives to PT evaluation on this date for R shoulder pain. Pt reports difficulty with housework and  reaching activities due to pain. During today's session pt demonstrates mild but painful reductions in GH mobility, TTP GH musculature, and L GH weakness, which are limiting ability to perform aforementioned activities. IR musculature significantly weaker than ER and deltoid muscles. Pt tolerates exam and HEP well without any increase in resting pain on departure. Recommend skilled PT to address aforementioned deficits to improve functional independence/tolerance. Pt departs today's session in no acute distress, all voiced questions/concerns addressed appropriately from PT perspective.     OBJECTIVE IMPAIRMENTS: decreased activity tolerance, decreased endurance,  decreased mobility, decreased ROM, decreased strength, impaired UE functional use, postural dysfunction, and pain.    ACTIVITY LIMITATIONS: carrying, lifting, sleeping, bathing, dressing, reach over head, and hygiene/grooming   PARTICIPATION LIMITATIONS: meal prep, cleaning, laundry, driving, and shopping   PERSONAL FACTORS: 1-2 comorbidities: HTN, asthma  are also affecting patient's functional outcome.    REHAB POTENTIAL: Good   CLINICAL DECISION MAKING: Stable/uncomplicated   EVALUATION COMPLEXITY: Low     GOALS: Goals reviewed with patient? No   SHORT TERM GOALS: Target date: 11/04/2022 Pt will demonstrate appropriate understanding and performance of initially prescribed HEP in order to facilitate improved independence with management of symptoms.  Baseline: HEP provided on eval 11/02/22: pt endorses good compliance with HEP, no issues  Goal status: MET   2. Pt will score less than or equal to 46 on QuickDASH in order to demonstrate improved perception of function due to symptoms.            Baseline: 56.82  11/02/22: 29.54             Goal status: MET   LONG TERM GOALS: Target date: 11/25/2022 Pt will score less than or equal to 36 on Quick DASH in order to indicate reduced levels of disability due to shoulder pain ([facility]  16-20pts).            Baseline: 56.82   11/02/22: 29.54            Goal status: MET   2.  Pt will demonstrate at least 145 degrees of active R shoulder elevation in order to demonstrate improved tolerance to functional movement patterns such as overhead reaching Baseline: see ROM chart above Goal status: INITIAL   3.  Pt will demonstrate at least 4+/5 shoulder R shoulder ER/IR MMT for improved symmetry of UE strength and improved tolerance to functional movements.  Baseline: see MMT chart above Goal status: INITIAL   4. Pt will report at least 50% reduction in waking due to shoulder pain in order to facilitate improved overall health and QOL.             Baseline: waking nightly due to pain            Goal status: INITIAL    5. Pt will report at least 50% decrease in overall pain levels in past week in order to facilitate improved tolerance to basic ADLs/mobility.             Baseline: 5-9/10 over past week            Goal status: INITIAL     6. Pt will demonstrate appropriate performance of final prescribed HEP in order to facilitate improved self-management of symptoms post-discharge.             Baseline: initial HEP prescribed            Goal status: INITIAL     7. Pt will be able to lift/carry up to 10# with less than 3pt increase in resting pain in order to facilitate improved tolerance to heavier activities around home such as dishes/laundry.             Baseline: increased pain w/ dishes/laundry            Goal status: INITIAL   PLAN:   PT FREQUENCY: 2x/week   PT DURATION: 6 weeks   PLANNED INTERVENTIONS: Therapeutic exercises, Therapeutic activity, Neuromuscular re-education, Balance training, Gait training, Patient/Family education, Self Care, Joint mobilization, DME instructions, Aquatic Therapy, Dry  Needling, Electrical stimulation, Spinal mobilization, Cryotherapy, Moist heat, Taping, Manual therapy, and Re-evaluation   PLAN FOR NEXT SESSION: Progress  ROM/strengthening exercises as able/appropriate, review HEP. Emphasis on periscapular mobility/activation, GH mobility and RC stability   Navid Lenzen PT, DPT, LAT, ATC  11/05/22  11:35 AM

## 2022-11-21 DIAGNOSIS — Z419 Encounter for procedure for purposes other than remedying health state, unspecified: Secondary | ICD-10-CM | POA: Diagnosis not present

## 2022-11-23 ENCOUNTER — Ambulatory Visit (INDEPENDENT_AMBULATORY_CARE_PROVIDER_SITE_OTHER): Payer: Medicaid Other | Admitting: Internal Medicine

## 2022-11-23 ENCOUNTER — Other Ambulatory Visit (HOSPITAL_COMMUNITY): Payer: Self-pay

## 2022-11-23 VITALS — BP 135/67 | HR 97 | Temp 98.4°F | Ht 62.0 in | Wt 280.2 lb

## 2022-11-23 DIAGNOSIS — I10 Essential (primary) hypertension: Secondary | ICD-10-CM | POA: Diagnosis not present

## 2022-11-23 DIAGNOSIS — D509 Iron deficiency anemia, unspecified: Secondary | ICD-10-CM | POA: Diagnosis not present

## 2022-11-23 DIAGNOSIS — E041 Nontoxic single thyroid nodule: Secondary | ICD-10-CM | POA: Diagnosis not present

## 2022-11-23 DIAGNOSIS — N951 Menopausal and female climacteric states: Secondary | ICD-10-CM

## 2022-11-23 DIAGNOSIS — R718 Other abnormality of red blood cells: Secondary | ICD-10-CM | POA: Diagnosis not present

## 2022-11-23 DIAGNOSIS — M5481 Occipital neuralgia: Secondary | ICD-10-CM | POA: Diagnosis not present

## 2022-11-23 DIAGNOSIS — T148XXA Other injury of unspecified body region, initial encounter: Secondary | ICD-10-CM

## 2022-11-23 MED ORDER — LISINOPRIL-HYDROCHLOROTHIAZIDE 20-12.5 MG PO TABS
2.0000 | ORAL_TABLET | Freq: Every day | ORAL | 11 refills | Status: DC
  Filled 2022-11-23 – 2023-01-13 (×2): qty 60, 30d supply, fill #0

## 2022-11-23 MED ORDER — IBUPROFEN 800 MG PO TABS
800.0000 mg | ORAL_TABLET | Freq: Three times a day (TID) | ORAL | 0 refills | Status: DC | PRN
  Filled 2022-11-23 – 2023-01-13 (×2): qty 10, 4d supply, fill #0

## 2022-11-23 NOTE — Assessment & Plan Note (Signed)
History of iron deficiency anemia. Last hgb in 2021 was within normal limits but she did have microcytosis.  P: Repeat CBC  Addendum 6/6: CBC showed Hgb within normal limits. MCV reflects microcytosis. She currently takes iron supplement one weekly. I encouraged her to increase to every other day. Will plan to repeat iron studies at follow-up. Last colonoscopy 2019 and no polyps at that time, due for repeat in 2029.

## 2022-11-23 NOTE — Progress Notes (Signed)
Subjective:  CC: pain in head  HPI:  Tammie Solis is a 57 y.o. female with a past medical history stated below and presents today with a new headache. She has had headaches in the past but nothing like this. The pain is located in left back of head. Pain is constant sharp and does not radiate.  She has noted that it worsens when she leans forward. She is concerned that this could be a stroke and that is why she came into the clinic today. She has not had difficulty speaking or noticed a change in coordination of her limbs. She has not tried ibuprofen or tylenol for this. Please see problem based assessment and plan for additional details.  Past Medical History:  Diagnosis Date   Allergy    Anemia    Arthritis    knees    Asthma    GERD (gastroesophageal reflux disease)    occasionally    Neuromuscular disorder (HCC)    sciatica left leg    Current Outpatient Medications on File Prior to Visit  Medication Sig Dispense Refill   acetaminophen (TYLENOL) 650 MG CR tablet Take 1 tablet (650 mg total) by mouth every 8 (eight) hours as needed for pain. 60 tablet 0   albuterol (VENTOLIN HFA) 108 (90 Base) MCG/ACT inhaler Inhale 2 puffs into the lungs every 4 (four) hours as needed for wheezing or shortness of breath. 1 each 0   budesonide-formoterol (SYMBICORT) 80-4.5 MCG/ACT inhaler Inhale 2 puffs into the lungs 2 (two) times daily. 10.2 g 12   Loratadine 10 MG CAPS Take by mouth.     Multiple Vitamins-Minerals (CENTRUM SILVER 50+WOMEN PO) Take 1 each by mouth daily.     Probiotic Product (RA PROBIOTIC GUMMIES PO) Take by mouth daily.     rosuvastatin (CRESTOR) 20 MG tablet Take 1 tablet (20 mg total) by mouth daily. 30 tablet 11   Current Facility-Administered Medications on File Prior to Visit  Medication Dose Route Frequency Provider Last Rate Last Admin   lidocaine (PF) (XYLOCAINE) 1 % injection 2 mL  2 mL Other Once Gust Rung, DO        Family History  Problem Relation  Age of Onset   Diabetes Mother    Kidney failure Mother    Cancer Father        prostate   Prostate cancer Father    Colon cancer Neg Hx    Colon polyps Neg Hx    Esophageal cancer Neg Hx    Rectal cancer Neg Hx    Stomach cancer Neg Hx    Pancreatic cancer Neg Hx     Social History   Socioeconomic History   Marital status: Single    Spouse name: Not on file   Number of children: Not on file   Years of education: Not on file   Highest education level: Not on file  Occupational History   Not on file  Tobacco Use   Smoking status: Never   Smokeless tobacco: Never  Substance and Sexual Activity   Alcohol use: No   Drug use: No   Sexual activity: Never    Birth control/protection: None  Other Topics Concern   Not on file  Social History Narrative   Not on file   Social Determinants of Health   Financial Resource Strain: High Risk (09/29/2021)   Overall Financial Resource Strain (CARDIA)    Difficulty of Paying Living Expenses: Hard  Food Insecurity: No Food Insecurity (  09/10/2022)   Hunger Vital Sign    Worried About Running Out of Food in the Last Year: Never true    Ran Out of Food in the Last Year: Never true  Transportation Needs: No Transportation Needs (09/10/2022)   PRAPARE - Administrator, Civil Service (Medical): No    Lack of Transportation (Non-Medical): No  Physical Activity: Not on file  Stress: Not on file  Social Connections: Moderately Isolated (06/03/2022)   Social Connection and Isolation Panel [NHANES]    Frequency of Communication with Friends and Family: More than three times a week    Frequency of Social Gatherings with Friends and Family: More than three times a week    Attends Religious Services: Never    Database administrator or Organizations: Yes    Attends Banker Meetings: Never    Marital Status: Never married  Intimate Partner Violence: Not At Risk (09/10/2022)   Humiliation, Afraid, Rape, and Kick  questionnaire    Fear of Current or Ex-Partner: No    Emotionally Abused: No    Physically Abused: No    Sexually Abused: No    Review of Systems: ROS negative except for what is noted on the assessment and plan.  Objective:   Vitals:   11/23/22 0912 11/23/22 1008  BP: (!) 144/64 135/67  Pulse: 97   Temp: 98.4 F (36.9 C)   TempSrc: Oral   SpO2: 98%   Weight: 280 lb 3.2 oz (127.1 kg)   Height: 5\' 2"  (1.575 m)     Physical Exam: Constitutional: well-appearing, in no acute distress HENT: 1cmx 1cm nodule present to left thyroid Cardiovascular: regular rate and rhythm, no m/r/g Pulmonary/Chest: normal work of breathing on room air, lungs clear to auscultation bilaterally MSK: tenderness to palpation along cervical paraspinal muscles this does not radiate into area of pain in head, tenderness present to area of left greater occipital nerve, full range of motion in flexion and extension of neck Neurological:  Mental Status: Patient is awake, alert, oriented x3, No signs of aphasia or neglect Cranial Nerves: II: Pupils equal, round, and reactive to light.   III,IV, VI: EOMI without ptosis or diploplia.  V: Facial sensation is symmetric to light touch and temperature. VII: Facial movement is symmetric.  VIII: Hearing is intact to voice X: Uvula elevates symmetrically XI: Shoulder shrug is symmetric. XII: Tongue is midline without atrophy or fasciculations.  Motor: good effort thorughout, at least 5/5 bilateral UE, 5/5 bilateral LE Sensory: Sensation is grossly intact in bilateral UE & LE  Assessment & Plan:  Thyroid nodule Thyroid nodule noted on exam in 2023. TSH at that time within normal limits. She was not able to undergo thyroid ultrasound due to cost. She could like to have this evaluated further now. She has not noted a change in size of nodule. Denies difficulty swallowing. -thyroid ultrasound  Vasomotor symptoms due to menopause Venlafaxine removed from medication  list as she has not taken in several months and feels like her symptoms from menopause have resolved.  Iron deficiency anemia History of iron deficiency anemia. Last hgb in 2021 was within normal limits but she did have microcytosis.  P: Repeat CBC  Occipital neuralgia of left side She presents with 1.5 weeks history of sudden on-set sharp pain to left occipital region. No neuro deficits present. She had tenderness at greater occipital nerve that worsened when palpated. It sounds like she has been working closely with PT for her right  shoulder and I question if movement from that could have lead to occipital neuralgia. Her neuro exam was reassuring and pain is externally localizable.  P: Patient was reassure and educated on diagnosis.  -ibuprofen 800 mg TID PRN -tylenol 1000mg  every 6 hrs PRN -heating pad  Hypertension Blood pressure initially elevated at 144/64. On repeat improved to 135/67. She has not been taking medication regularly but did take a dose this morning.  -Lisinopril-HCTZ 40-25 mg refilled in 30 day increments for 11 refills -repeat BMP   Patient seen with Dr. Marjorie Smolder Samia Kukla, D.O. [facility]  PGY-2 Pager: [phone]  Phone: [phone] Date 11/23/2022  Time 5:09 PM

## 2022-11-23 NOTE — Assessment & Plan Note (Signed)
She presents with 1.5 weeks history of sudden on-set sharp pain to left occipital region. No neuro deficits present. She had tenderness at greater occipital nerve that worsened when palpated. It sounds like she has been working closely with PT for her right shoulder and I question if movement from that could have lead to occipital neuralgia. Her neuro exam was reassuring and pain is externally localizable.  P: Patient was reassure and educated on diagnosis.  -ibuprofen 800 mg TID PRN -tylenol 1000mg  every 6 hrs PRN -heating pad

## 2022-11-23 NOTE — Assessment & Plan Note (Signed)
Blood pressure initially elevated at 144/64. On repeat improved to 135/67. She has not been taking medication regularly but did take a dose this morning.  -Lisinopril-HCTZ 40-25 mg refilled in 30 day increments for 11 refills -repeat BMP

## 2022-11-23 NOTE — Assessment & Plan Note (Signed)
Thyroid nodule noted on exam in 2023. TSH at that time within normal limits. She was not able to undergo thyroid ultrasound due to cost. She could like to have this evaluated further now. She has not noted a change in size of nodule. Denies difficulty swallowing. -thyroid ultrasound

## 2022-11-23 NOTE — Patient Instructions (Addendum)
Thank you, Tammie Solis for allowing [name] to provide your care today.   Muscle tension- please take tylenol 1000 mg every 6 hours for next 3 days. I have sent in ibuprofen 800 mg, take this 3x a day for the next few days. You can also try heating pads to the area as well.  I have placed an order for thyroid imaging, you will receive a call about that.  Im checking kidney labs and blood count. I will call with results.  I have ordered the following labs for you:  Lab Orders         BMP8+Anion Gap         CBC no Diff       Tests ordered today:  Thyroid ultrasound    I have ordered the following medication/changed the following medications:   Stop the following medications: Medications Discontinued During This Encounter  Medication Reason   venlafaxine (EFFEXOR) 37.5 MG tablet    venlafaxine XR (EFFEXOR XR) 75 MG 24 hr capsule    triamcinolone acetonide (KENALOG-40) injection 40 mg    lisinopril-hydrochlorothiazide (ZESTORETIC) 20-12.5 MG tablet Reorder   ibuprofen (ADVIL) 400 MG tablet      Start the following medications: Meds ordered this encounter  Medications   lisinopril-hydrochlorothiazide (ZESTORETIC) 20-12.5 MG tablet    Sig: Take 2 tablets by mouth daily.    Dispense:  60 tablet    Refill:  11    IM program   ibuprofen (ADVIL) 800 MG tablet    Sig: Take 1 tablet (800 mg total) by mouth every 8 (eight) hours as needed.    Dispense:  10 tablet    Refill:  0     Follow up:  1 month for blood pressure check  We look forward to seeing you next time. Please call our clinic at [phone] if you have any questions or concerns. The best time to call is Monday-Friday from 9am-4pm, but there is someone available 24/7. If after hours or the weekend, call the main hospital number and ask for the Internal Medicine Resident On-Call. If you need medication refills, please notify your pharmacy one week in advance and they will send [name] a request.   Thank you for trusting me  with your care. Wishing you the best!   Rudene Christians, DO [facility]

## 2022-11-23 NOTE — Assessment & Plan Note (Signed)
Venlafaxine removed from medication list as she has not taken in several months and feels like her symptoms from menopause have resolved.

## 2022-11-24 ENCOUNTER — Ambulatory Visit: Payer: Medicaid Other | Attending: Internal Medicine | Admitting: Physical Therapy

## 2022-11-24 ENCOUNTER — Encounter: Payer: Self-pay | Admitting: Physical Therapy

## 2022-11-24 DIAGNOSIS — M6281 Muscle weakness (generalized): Secondary | ICD-10-CM

## 2022-11-24 DIAGNOSIS — M25511 Pain in right shoulder: Secondary | ICD-10-CM | POA: Diagnosis not present

## 2022-11-24 DIAGNOSIS — M25611 Stiffness of right shoulder, not elsewhere classified: Secondary | ICD-10-CM | POA: Diagnosis not present

## 2022-11-24 DIAGNOSIS — Z7689 Persons encountering health services in other specified circumstances: Secondary | ICD-10-CM | POA: Diagnosis not present

## 2022-11-24 LAB — CBC
Hematocrit: 43.9 % (ref 34.0–46.6)
Hemoglobin: 13.2 g/dL (ref 11.1–15.9)
MCH: 21.9 pg — ABNORMAL LOW (ref 26.6–33.0)
MCHC: 30.1 g/dL — ABNORMAL LOW (ref 31.5–35.7)
MCV: 73 fL — ABNORMAL LOW (ref 79–97)
Platelets: 334 10*3/uL (ref 150–450)
RBC: 6.02 x10E6/uL — ABNORMAL HIGH (ref 3.77–5.28)
RDW: 15.8 % — ABNORMAL HIGH (ref 11.7–15.4)
WBC: 6.3 10*3/uL (ref 3.4–10.8)

## 2022-11-24 LAB — BMP8+ANION GAP
Anion Gap: 13 mmol/L (ref 10.0–18.0)
BUN/Creatinine Ratio: 13 (ref 9–23)
BUN: 11 mg/dL (ref 6–24)
CO2: 24 mmol/L (ref 20–29)
Calcium: 10 mg/dL (ref 8.7–10.2)
Chloride: 102 mmol/L (ref 96–106)
Creatinine, Ser: 0.87 mg/dL (ref 0.57–1.00)
Glucose: 96 mg/dL (ref 70–99)
Potassium: 4.3 mmol/L (ref 3.5–5.2)
Sodium: 139 mmol/L (ref 134–144)
eGFR: 77 mL/min/{1.73_m2} (ref >59)

## 2022-11-24 NOTE — Therapy (Signed)
OUTPATIENT PHYSICAL THERAPY TREATMENT NOTE + RECERTIFICATION   Patient Name: Tammie Solis MRN: [id number] DOB:December 23, 1965, 57 y.o., female Today's Date: 11/24/2022  PCP: Rudene Christians, DO REFERRING PROVIDER: Mercie Eon, MD  END OF SESSION:   PT End of Session - 11/24/22 1038     Visit Number 5    Number of Visits 13    Date for PT Re-Evaluation 12/22/22    Authorization Type [facility]    Authorization Time Period 10/26/22-12/25/22    Authorization - Visit Number 4    Authorization - Number of Visits 10    PT Start Time 1040    PT Stop Time 1122    PT Time Calculation (min) 42 min    Activity Tolerance Patient tolerated treatment well;No increased pain    Behavior During Therapy WFL for tasks assessed/performed                Past Medical History:  Diagnosis Date   Allergy    Anemia    Arthritis    knees    Asthma    GERD (gastroesophageal reflux disease)    occasionally    Neuromuscular disorder (HCC)    sciatica left leg   Past Surgical History:  Procedure Laterality Date   CESAREAN SECTION     x2   Patient Active Problem List   Diagnosis Date Noted   Occipital neuralgia of left side 11/23/2022   Shoulder pain, right 09/10/2022   Vasomotor symptoms due to menopause 06/03/2022   Prediabetes 06/03/2022   Need for tetanus booster 01/29/2022   Fall 01/29/2022   Foot laceration, right, initial encounter 01/29/2022   Housing instability, currently housed, at risk for homelessness 09/05/2021   Mononeuropathy 09/05/2021   Thyroid nodule 07/05/2021   Polyneuropathy 07/05/2021   Cough 05/29/2021   Asthma 05/29/2021   Superficial phlebitis 03/17/2021   Left leg pain 02/13/2021   Health care maintenance 01/09/2021   Hyperlipidemia 07/30/2020   Osteoarthritis of left knee 07/26/2020   Hypertension 03/12/2020   Osteoarthritis of left hip 03/12/2020   Morbid obesity (HCC) 03/12/2020   Iron deficiency anemia 03/12/2020    REFERRING DIAG: M25.511  (ICD-10-CM) - Acute pain of right shoulder  THERAPY DIAG:  Right shoulder pain, unspecified chronicity  Muscle weakness (generalized)  Stiffness of right shoulder, not elsewhere classified  Rationale for Evaluation and Treatment Rehabilitation  PERTINENT HISTORY: HTN, asthma   PRECAUTIONS: none  SUBJECTIVE STATEMENT:   11/24/2022 Pt states the last week and a half she has had a pretty significant headache, saw her doctor yesterday and she states it was attributed to muscular issue on L side of her neck. Pt also notes that her shoulder has also been more irritated, unsure of provocative factors as she states she felt good during past few PT sessions and didn't have any issues after. No headache today, most of her shoulder pain is at night when she rolls onto it. Feels her shoulder pain fluctuates from day to day but has improved compared to start of care   Hand dominance: Right  PAIN:  Are you having pain: none Location/description: R shoulder, anteriorly; achey/throbbing  Best-worst in past week: 0-10/10  Per eval -  Best-worst over past week: 5-9/10  - aggravating factors: reaching overhead, lifting - Easing factors: medication, rest   OBJECTIVE: (objective measures completed at initial evaluation unless otherwise dated)   DIAGNOSTIC FINDINGS:  No recent shoulder imaging per EPIC or pt    PATIENT SURVEYS:  Quick Dash 56.82 QuickDASH 11/02/22:  29.54  Quickdash 11/24/22: 22.72%   COGNITION: Overall cognitive status: Within functional limits for tasks assessed                                  SENSATION: LT intact BUE   POSTURE: Forward head posture, B UT elevation   UPPER EXTREMITY ROM:   A/PROM Right eval Left eval R 10/29/22 R AROM 11/24/22  Shoulder flexion 138 * 148 deg 149deg * 156 deg painless  Shoulder abduction 110 deg mild pain 139 deg 122 deg *  96 deg mild pain   Shoulder internal rotation        Shoulder external rotation        Elbow flexion         Elbow extension        Wrist flexion        Wrist extension         (Blank rows = not tested) (Key: WFL = within functional limits not formally assessed, * = concordant pain, s = stiffness/stretching sensation, NT = not tested)  Comments:     UPPER EXTREMITY MMT:   MMT Right eval Left eval R/L 11/24/22  Shoulder flexion 4 * 5 4 * / 5  Shoulder extension 4+ 4+ 5 / 5  Shoulder abduction 4* 5 5 / 5  Shoulder internal rotation 3+ 5 4 * / 5   Shoulder external rotation 4 * 5 4 * / 5   Elbow flexion       Elbow extension       Grip strength       (Blank rows = not tested)  (Key: WFL = within functional limits not formally assessed, * = concordant pain, s = stiffness/stretching sensation, NT = not tested)  Comments:    JOINT MOBILITY TESTING:  deferred   PALPATION:  Significant TTP bicipital groove, anterior deltoid, lateral supraspinatus. Generalized tightness, non tender for remainder of deltoid, LS, and UT              TODAY'S TREATMENT:     [facility] Adult PT Treatment:                                                DATE: 11/24/22 Therapeutic Exercise: GTB row x10 noted compensations at UT cues to reduce YTB shoulder ext emphasis on shoulder mechanics and reduced UT involvement 2x8  HEP update + education, provided with yellow band for shoulder ext  Therapeutic Activity: MSK assessment + education QuickDASH + education Education/discussion re: progress with PT, symptom behavior as it affects activity tolerance, PT goals/POC  Lifting assessment  5# grocery simulation (raised mat lift, suitcase carry 75ft and bring back), 2 laps  10# grocery simulation, 2 laps  PATIENT EDUCATION: Education details: rationale for interventions, HEP, relevant anatomy/physiology, PT goals/POC, symptom behavior Person educated: Patient Education method: Explanation,  Demonstration, Tactile cues, Verbal cues, and Handouts Education comprehension: verbalized understanding, returned demonstration, verbal cues required, tactile cues required, and needs further education     HOME EXERCISE PROGRAM: Access Code: [email or web address] URL: [email or web address] Date: 11/24/2022 Prepared by: Fransisco Hertz  Exercises - Seated Scapular Retraction  - 1 x daily - 7 x weekly - 3 sets - 10 reps - Gentle Levator Scapulae Stretch  - 1 x daily - 7 x weekly - 1-3 sets - 1-3 reps - 30sec hold - Standing Single Arm Elbow Flexion with Resistance  - 1 x daily - 7 x weekly - 2 sets - 10 reps - Doorway Pec Stretch at 90 Degrees Abduction  - 2-3 x daily - 7 x weekly - 2 sets - 1-2 reps - 30 seconds hold - Lower Trap Wall slides  - 1 x daily - 7 x weekly - 2 sets - 10 reps - Wall Push Up with Plus  - 1 x daily - 7 x weekly - 2 sets - 10 reps - 5 hold - Shoulder extension with resistance - Neutral  - 1 x daily - 7 x weekly - 2 sets - 8 reps  ASSESSMENT:   CLINICAL IMPRESSION: 11/24/2022 Pt arrives w/o pain, notes her shoulder has seemed more irritated the last couple weeks and she also had onset of headache that she saw her PCP about yesterday. Notes that while she continues to have irritation in shoulder with lifting activities and while rolling onto it at night, but does have periods without pain which is improved compared to start of care. On exam, noted improvement in [facility] mobility in sagittal plane, as well as improvement in [facility] strength although this remains asymmetrical and provocative. Discussion with pt, mutual decision is made to extend POC dates to accommodate originally planned number of visits to work on shoulder strengthening. Revised HEP to replace rows with shoulder extensions as with the former, she is noted to have significant UT compensations and limited ability to correct. No adverse events, pt denies any increase in pain on departure, no headache during session.  Recommend continuing along updated POC in order to address relevant deficits and improve functional tolerance. Pt departs today's session in no acute distress, all voiced questions/concerns addressed appropriately from PT perspective.     Per eval - Pt is a pleasant 57 year old woman who arrives to PT evaluation on this date for R shoulder pain. Pt reports difficulty with housework and reaching activities due to pain. During today's session pt demonstrates mild but painful reductions in GH mobility, TTP GH musculature, and L GH weakness, which are limiting ability to perform aforementioned activities. IR musculature significantly weaker than ER and deltoid muscles. Pt tolerates exam and HEP well without any increase in resting pain on departure. Recommend skilled PT to address aforementioned deficits to improve functional independence/tolerance. Pt departs today's session in no acute distress, all voiced questions/concerns addressed appropriately from PT perspective.     OBJECTIVE IMPAIRMENTS: decreased activity tolerance, decreased endurance, decreased mobility, decreased ROM, decreased strength, impaired UE functional use, postural dysfunction, and pain.    ACTIVITY LIMITATIONS: carrying, lifting, sleeping, bathing, dressing, reach over head, and hygiene/grooming   PARTICIPATION LIMITATIONS: meal prep, cleaning, laundry, driving, and shopping   PERSONAL FACTORS: 1-2 comorbidities: HTN, asthma  are also affecting patient's functional outcome.    REHAB POTENTIAL: Good   CLINICAL  DECISION MAKING: Stable/uncomplicated   EVALUATION COMPLEXITY: Low     GOALS: Goals reviewed with patient? No   SHORT TERM GOALS: Target date: 11/04/2022 Pt will demonstrate appropriate understanding and performance of initially prescribed HEP in order to facilitate improved independence with management of symptoms.  Baseline: HEP provided on eval 11/02/22: pt endorses good compliance with HEP, no issues  Goal  status: MET   2. Pt will score less than or equal to 46 on QuickDASH in order to demonstrate improved perception of function due to symptoms.            Baseline: 56.82  11/02/22: 29.54             Goal status: MET   LONG TERM GOALS: Target date: 12/22/2022  (Updated 11/24/22) Pt will score less than or equal to 36 on Quick DASH in order to indicate reduced levels of disability due to shoulder pain ([facility] 16-20pts).            Baseline: 56.82   11/02/22: 29.54  11/24/22: 22.72%            Goal status: MET   2.  Pt will demonstrate at least 145 degrees of active R shoulder elevation in order to demonstrate improved tolerance to functional movement patterns such as overhead reaching Baseline: see ROM chart above 11/24/22: see ROM chart above Goal status: ONGOING   3.  Pt will demonstrate at least 4+/5 shoulder R shoulder ER/IR MMT for improved symmetry of UE strength and improved tolerance to functional movements.  Baseline: see MMT chart above 11/24/22: see MMT chart above Goal status: PROGRESSING   4. Pt will report at least 50% reduction in waking due to shoulder pain in order to facilitate improved overall health and QOL.             Baseline: waking nightly due to pain  11/24/22: waking nightly due to rolling on shoulder            Goal status: ONGOING   5. Pt will report at least 50% decrease in overall pain levels in past week in order to facilitate improved tolerance to basic ADLs/mobility.             Baseline: 5-9/10 over past week  11/24/22: 0-10/10            Goal status: PROGRESSING    6. Pt will demonstrate appropriate performance of final prescribed HEP in order to facilitate improved self-management of symptoms post-discharge.             Baseline: initial HEP prescribed  11/24/22: good HEP adherence reported            Goal status: ONGOING   7. Pt will be able to lift/carry up to 10# with less than 3pt increase in resting pain in order to facilitate improved tolerance to heavier  activities around home such as dishes/laundry.             Baseline: increased pain w/ dishes/laundry  11/24/22: difficulty with groceries and laundry            Goal status: ONGOING   PLAN: (updated 11/24/22 to accommodate remaining visits in POC)   PT FREQUENCY: 2x/week   PT DURATION: 4 weeks   PLANNED INTERVENTIONS: Therapeutic exercises, Therapeutic activity, Neuromuscular re-education, Balance training, Gait training, Patient/Family education, Self Care, Joint mobilization, DME instructions, Aquatic Therapy, Dry Needling, Electrical stimulation, Spinal mobilization, Cryotherapy, Moist heat, Taping, Manual therapy, and Re-evaluation   PLAN FOR NEXT  SESSION: work on [employer] as able/tolerated. Work on scapulohumeral rhythm and reducing UT compensations with periscapular movement.    Ashley Murrain PT, DPT 11/24/2022 11:38 AM

## 2022-11-25 NOTE — Progress Notes (Signed)
Internal Medicine Clinic Attending  I saw and evaluated the patient.  I personally confirmed the key portions of the history and exam documented by Dr. Sloan Leiter and I reviewed pertinent patient test results.  The assessment, diagnosis, and plan were formulated together and I agree with the documentation in the resident's note. Tight/spasmed paracervical muscles on left are tender; suspect muscle tension here may have caused some irritation of occipital nerve.  This is an extracranial head pain and should improve with heat, topical muscle rub, massage, and NSAID/tylenol

## 2022-11-26 ENCOUNTER — Ambulatory Visit: Payer: Medicaid Other | Admitting: Physical Therapy

## 2022-11-27 NOTE — Therapy (Signed)
OUTPATIENT PHYSICAL THERAPY TREATMENT NOTE    Patient Name: Tammie Solis MRN: [id number] DOB:[birth year], 57 y.o., female Today's Date: 11/27/2022  PCP: Rudene Christians, DO REFERRING PROVIDER: Mercie Eon, MD  END OF SESSION:        Past Medical History:  Diagnosis Date   Allergy    Anemia    Arthritis    knees    Asthma    GERD (gastroesophageal reflux disease)    occasionally    Neuromuscular disorder (HCC)    sciatica left leg   Past Surgical History:  Procedure Laterality Date   CESAREAN SECTION     x2   Patient Active Problem List   Diagnosis Date Noted   Occipital neuralgia of left side 11/23/2022   Shoulder pain, right 09/10/2022   Vasomotor symptoms due to menopause 06/03/2022   Prediabetes 06/03/2022   Need for tetanus booster 01/29/2022   Fall 01/29/2022   Foot laceration, right, initial encounter 01/29/2022   Housing instability, currently housed, at risk for homelessness 09/05/2021   Mononeuropathy 09/05/2021   Thyroid nodule 07/05/2021   Polyneuropathy 07/05/2021   Cough 05/29/2021   Asthma 05/29/2021   Superficial phlebitis 03/17/2021   Left leg pain 02/13/2021   Health care maintenance 01/09/2021   Hyperlipidemia 07/30/2020   Osteoarthritis of left knee 07/26/2020   Hypertension 03/12/2020   Osteoarthritis of left hip 03/12/2020   Morbid obesity (HCC) 03/12/2020   Iron deficiency anemia 03/12/2020    REFERRING DIAG: M25.511 (ICD-10-CM) - Acute pain of right shoulder  THERAPY DIAG:  No diagnosis found.  Rationale for Evaluation and Treatment Rehabilitation  PERTINENT HISTORY: HTN, asthma   PRECAUTIONS: none  SUBJECTIVE STATEMENT:   11/27/2022 ***  *** Pt states the last week and a half she has had a pretty significant headache, saw her doctor yesterday and she states it was attributed to muscular issue on L side of her neck. Pt also notes that her shoulder has also been more irritated, unsure of provocative factors as she states she  felt good during past few PT sessions and didn't have any issues after. No headache today, most of her shoulder pain is at night when she rolls onto it. Feels her shoulder pain fluctuates from day to day but has improved compared to start of care   Hand dominance: Right  PAIN:  Are you having pain: none Location/description: R shoulder, anteriorly; achey/throbbing  Best-worst in past week: 0-10/10  Per eval -  Best-worst over past week: 5-9/10  - aggravating factors: reaching overhead, lifting - Easing factors: medication, rest   OBJECTIVE: (objective measures completed at initial evaluation unless otherwise dated)   DIAGNOSTIC FINDINGS:  No recent shoulder imaging per [facility] or pt    PATIENT SURVEYS:  Quick Dash 56.82 QuickDASH 11/02/22: 29.54  Quickdash 11/24/22: 22.72%   COGNITION: Overall cognitive status: Within functional limits for tasks assessed                                  SENSATION: LT intact BUE   POSTURE: Forward head posture, B UT elevation   UPPER EXTREMITY ROM:   A/PROM Right eval Left eval R 10/29/22 R AROM 11/24/22  Shoulder flexion 138 * 148 deg 149deg * 156 deg painless  Shoulder abduction 110 deg mild pain 139 deg 122 deg *  96 deg mild pain   Shoulder internal rotation        Shoulder external rotation  Elbow flexion        Elbow extension        Wrist flexion        Wrist extension         (Blank rows = not tested) (Key: WFL = within functional limits not formally assessed, * = concordant pain, s = stiffness/stretching sensation, NT = not tested)  Comments:     UPPER EXTREMITY MMT:   MMT Right eval Left eval R/L 11/24/22  Shoulder flexion 4 * 5 4 * / 5  Shoulder extension 4+ 4+ 5 / 5  Shoulder abduction 4* 5 5 / 5  Shoulder internal rotation 3+ 5 4 * / 5   Shoulder external rotation 4 * 5 4 * / 5   Elbow flexion       Elbow extension       Grip strength       (Blank rows = not tested)  (Key: WFL = within functional limits not  formally assessed, * = concordant pain, s = stiffness/stretching sensation, NT = not tested)  Comments:    JOINT MOBILITY TESTING:  deferred   PALPATION:  Significant TTP bicipital groove, anterior deltoid, lateral supraspinatus. Generalized tightness, non tender for remainder of deltoid, LS, and UT              TODAY'S TREATMENT:     [facility] Adult PT Treatment:                                                DATE: 11/30/22 Therapeutic Exercise: *** Manual Therapy: *** Neuromuscular re-ed: *** Therapeutic Activity: *** Modalities: *** Self Care: ***   [name] PT Treatment:                                                DATE: 11/24/22 Therapeutic Exercise: GTB row x10 noted compensations at UT cues to reduce YTB shoulder ext emphasis on shoulder mechanics and reduced UT involvement 2x8  HEP update + education, provided with yellow band for shoulder ext  Therapeutic Activity: MSK assessment + education QuickDASH + education Education/discussion re: progress with PT, symptom behavior as it affects activity tolerance, PT goals/POC  Lifting assessment  5# grocery simulation (raised mat lift, suitcase carry 65ft and bring back), 2 laps  10# grocery simulation, 2 laps                                                                                                                                      PATIENT EDUCATION: Education details: rationale for interventions, HEP, relevant anatomy/physiology Person educated: Patient Education method: Explanation, Demonstration, Tactile cues, Verbal cues, and Handouts  Education comprehension: verbalized understanding, returned demonstration, verbal cues required, tactile cues required, and needs further education     HOME EXERCISE PROGRAM: Access Code: [email or web address] URL: [email or web address] Date: 11/24/2022 Prepared by: Fransisco Hertz  Exercises - Seated Scapular Retraction  - 1 x daily - 7 x weekly - 3 sets - 10 reps - Gentle  Levator Scapulae Stretch  - 1 x daily - 7 x weekly - 1-3 sets - 1-3 reps - 30sec hold - Standing Single Arm Elbow Flexion with Resistance  - 1 x daily - 7 x weekly - 2 sets - 10 reps - Doorway Pec Stretch at 90 Degrees Abduction  - 2-3 x daily - 7 x weekly - 2 sets - 1-2 reps - 30 seconds hold - Lower Trap Wall slides  - 1 x daily - 7 x weekly - 2 sets - 10 reps - Wall Push Up with Plus  - 1 x daily - 7 x weekly - 2 sets - 10 reps - 5 hold - Shoulder extension with resistance - Neutral  - 1 x daily - 7 x weekly - 2 sets - 8 reps  ASSESSMENT:   CLINICAL IMPRESSION: 11/27/2022 ***  *** Pt arrives w/o pain, notes her shoulder has seemed more irritated the last couple weeks and she also had onset of headache that she saw her PCP about yesterday. Notes that while she continues to have irritation in shoulder with lifting activities and while rolling onto it at night, but does have periods without pain which is improved compared to start of care. On exam, noted improvement in [facility] mobility in sagittal plane, as well as improvement in [facility] strength although this remains asymmetrical and provocative. Discussion with pt, mutual decision is made to extend POC dates to accommodate originally planned number of visits to work on shoulder strengthening. Revised HEP to replace rows with shoulder extensions as with the former, she is noted to have significant UT compensations and limited ability to correct. No adverse events, pt denies any increase in pain on departure, no headache during session. Recommend continuing along updated POC in order to address relevant deficits and improve functional tolerance. Pt departs today's session in no acute distress, all voiced questions/concerns addressed appropriately from PT perspective.     Per eval - Pt is a pleasant 57 year old woman who arrives to PT evaluation on this date for R shoulder pain. Pt reports difficulty with housework and reaching activities due to pain. During  today's session pt demonstrates mild but painful reductions in GH mobility, TTP GH musculature, and L GH weakness, which are limiting ability to perform aforementioned activities. IR musculature significantly weaker than ER and deltoid muscles. Pt tolerates exam and HEP well without any increase in resting pain on departure. Recommend skilled PT to address aforementioned deficits to improve functional independence/tolerance. Pt departs today's session in no acute distress, all voiced questions/concerns addressed appropriately from PT perspective.     OBJECTIVE IMPAIRMENTS: decreased activity tolerance, decreased endurance, decreased mobility, decreased ROM, decreased strength, impaired UE functional use, postural dysfunction, and pain.    ACTIVITY LIMITATIONS: carrying, lifting, sleeping, bathing, dressing, reach over head, and hygiene/grooming   PARTICIPATION LIMITATIONS: meal prep, cleaning, laundry, driving, and shopping   PERSONAL FACTORS: 1-2 comorbidities: HTN, asthma  are also affecting patient's functional outcome.    REHAB POTENTIAL: Good   CLINICAL DECISION MAKING: Stable/uncomplicated   EVALUATION COMPLEXITY: Low     GOALS: Goals reviewed with patient? No   SHORT TERM GOALS: Target  date: 11/04/2022 Pt will demonstrate appropriate understanding and performance of initially prescribed HEP in order to facilitate improved independence with management of symptoms.  Baseline: HEP provided on eval 11/02/22: pt endorses good compliance with HEP, no issues  Goal status: MET   2. Pt will score less than or equal to 46 on QuickDASH in order to demonstrate improved perception of function due to symptoms.            Baseline: 56.82  11/02/22: 29.54             Goal status: MET   LONG TERM GOALS: Target date: 12/22/2022  (Updated 11/24/22) Pt will score less than or equal to 36 on Quick DASH in order to indicate reduced levels of disability due to shoulder pain (MDC 16-20pts).             Baseline: 56.82   11/02/22: 29.54  11/24/22: 22.72%            Goal status: MET   2.  Pt will demonstrate at least 145 degrees of active R shoulder elevation in order to demonstrate improved tolerance to functional movement patterns such as overhead reaching Baseline: see ROM chart above 11/24/22: see ROM chart above Goal status: ONGOING   3.  Pt will demonstrate at least 4+/5 shoulder R shoulder ER/IR MMT for improved symmetry of UE strength and improved tolerance to functional movements.  Baseline: see MMT chart above 11/24/22: see MMT chart above Goal status: PROGRESSING   4. Pt will report at least 50% reduction in waking due to shoulder pain in order to facilitate improved overall health and QOL.             Baseline: waking nightly due to pain  11/24/22: waking nightly due to rolling on shoulder            Goal status: ONGOING   5. Pt will report at least 50% decrease in overall pain levels in past week in order to facilitate improved tolerance to basic ADLs/mobility.             Baseline: 5-9/10 over past week  11/24/22: 0-10/10            Goal status: PROGRESSING    6. Pt will demonstrate appropriate performance of final prescribed HEP in order to facilitate improved self-management of symptoms post-discharge.             Baseline: initial HEP prescribed  11/24/22: good HEP adherence reported            Goal status: ONGOING   7. Pt will be able to lift/carry up to 10# with less than 3pt increase in resting pain in order to facilitate improved tolerance to heavier activities around home such as dishes/laundry.             Baseline: increased pain w/ dishes/laundry  11/24/22: difficulty with groceries and laundry            Goal status: ONGOING   PLAN: (updated 11/24/22 to accommodate remaining visits in POC)   PT FREQUENCY: 2x/week   PT DURATION: 4 weeks   PLANNED INTERVENTIONS: Therapeutic exercises, Therapeutic activity, Neuromuscular re-education, Balance training, Gait training,  Patient/Family education, Self Care, Joint mobilization, DME instructions, Aquatic Therapy, Dry Needling, Electrical stimulation, Spinal mobilization, Cryotherapy, Moist heat, Taping, Manual therapy, and Re-evaluation   PLAN FOR NEXT SESSION: work on [employer] strengthening as able/tolerated. Work on scapulohumeral rhythm and reducing UT compensations with periscapular movement. ***    Cliffton Asters  Koreen Lizaola PT, DPT 11/27/2022 8:09 AM

## 2022-11-30 ENCOUNTER — Encounter: Payer: Self-pay | Admitting: Physical Therapy

## 2022-11-30 ENCOUNTER — Ambulatory Visit: Payer: Medicaid Other | Admitting: Physical Therapy

## 2022-11-30 DIAGNOSIS — M6281 Muscle weakness (generalized): Secondary | ICD-10-CM | POA: Diagnosis not present

## 2022-11-30 DIAGNOSIS — M25611 Stiffness of right shoulder, not elsewhere classified: Secondary | ICD-10-CM

## 2022-11-30 DIAGNOSIS — Z7689 Persons encountering health services in other specified circumstances: Secondary | ICD-10-CM | POA: Diagnosis not present

## 2022-11-30 DIAGNOSIS — M25511 Pain in right shoulder: Secondary | ICD-10-CM

## 2022-12-01 ENCOUNTER — Ambulatory Visit (HOSPITAL_COMMUNITY)
Admission: RE | Admit: 2022-12-01 | Discharge: 2022-12-01 | Disposition: A | Discharge status: Home / Self Care | Payer: Medicaid Other | Source: Ambulatory Visit | Attending: Internal Medicine | Admitting: Internal Medicine

## 2022-12-01 DIAGNOSIS — E041 Nontoxic single thyroid nodule: Secondary | ICD-10-CM | POA: Diagnosis not present

## 2022-12-01 DIAGNOSIS — Z7689 Persons encountering health services in other specified circumstances: Secondary | ICD-10-CM | POA: Diagnosis not present

## 2022-12-03 ENCOUNTER — Ambulatory Visit: Payer: Medicaid Other | Admitting: Physical Therapy

## 2022-12-03 ENCOUNTER — Other Ambulatory Visit (HOSPITAL_COMMUNITY): Payer: Self-pay

## 2022-12-03 ENCOUNTER — Encounter: Payer: Self-pay | Admitting: Physical Therapy

## 2022-12-03 DIAGNOSIS — Z7689 Persons encountering health services in other specified circumstances: Secondary | ICD-10-CM | POA: Diagnosis not present

## 2022-12-03 DIAGNOSIS — M25511 Pain in right shoulder: Secondary | ICD-10-CM

## 2022-12-03 DIAGNOSIS — M25611 Stiffness of right shoulder, not elsewhere classified: Secondary | ICD-10-CM | POA: Diagnosis not present

## 2022-12-03 DIAGNOSIS — M6281 Muscle weakness (generalized): Secondary | ICD-10-CM

## 2022-12-03 NOTE — Therapy (Signed)
OUTPATIENT PHYSICAL THERAPY TREATMENT NOTE    Patient Name: Tammie Solis MRN: [id number] DOB:[birth year], 57 y.o., female Today's Date: 12/03/2022  PCP: Rudene Christians, DO REFERRING PROVIDER: Mercie Eon, MD  END OF SESSION:   PT End of Session - 12/03/22 1019     Visit Number 7    Number of Visits 13    Date for PT Re-Evaluation 12/22/22    Authorization Type [facility]    Authorization Time Period 10/26/22-12/25/22    Authorization - Visit Number 6    Authorization - Number of Visits 10    PT Start Time 1017    PT Stop Time 1057    PT Time Calculation (min) 40 min                 Past Medical History:  Diagnosis Date   Allergy    Anemia    Arthritis    knees    Asthma    GERD (gastroesophageal reflux disease)    occasionally    Neuromuscular disorder (HCC)    sciatica left leg   Past Surgical History:  Procedure Laterality Date   CESAREAN SECTION     x2   Patient Active Problem List   Diagnosis Date Noted   Occipital neuralgia of left side 11/23/2022   Shoulder pain, right 09/10/2022   Vasomotor symptoms due to menopause 06/03/2022   Prediabetes 06/03/2022   Need for tetanus booster 01/29/2022   Fall 01/29/2022   Foot laceration, right, initial encounter 01/29/2022   Housing instability, currently housed, at risk for homelessness 09/05/2021   Mononeuropathy 09/05/2021   Thyroid nodule 07/05/2021   Polyneuropathy 07/05/2021   Cough 05/29/2021   Asthma 05/29/2021   Superficial phlebitis 03/17/2021   Left leg pain 02/13/2021   Health care maintenance 01/09/2021   Hyperlipidemia 07/30/2020   Osteoarthritis of left knee 07/26/2020   Hypertension 03/12/2020   Osteoarthritis of left hip 03/12/2020   Morbid obesity (HCC) 03/12/2020   Iron deficiency anemia 03/12/2020    REFERRING DIAG: M25.511 (ICD-10-CM) - Acute pain of right shoulder  THERAPY DIAG:  Right shoulder pain, unspecified chronicity  Muscle weakness (generalized)  Rationale  for Evaluation and Treatment Rehabilitation  PERTINENT HISTORY: HTN, asthma   PRECAUTIONS: none  SUBJECTIVE STATEMENT:   12/03/2022 Pt states headaches have resolved. States she felt "not bad at all" after last session. States she has had some increased pain over last couple days, unsure of provocative factors, HEP going well.   Hand dominance: Right  PAIN:  Are you having pain: 5/10 Location/description: R shoulder, anteriorly; achey/throbbing  Best-worst in past week: 0-10/10  Per eval -  Best-worst over past week: 5-9/10  - aggravating factors: reaching overhead, lifting - Easing factors: medication, rest   OBJECTIVE: (objective measures completed at initial evaluation unless otherwise dated)   DIAGNOSTIC FINDINGS:  No recent shoulder imaging per EPIC or pt    PATIENT SURVEYS:  Quick Dash 56.82 QuickDASH 11/02/22: 29.54  Quickdash 11/24/22: 22.72%   COGNITION: Overall cognitive status: Within functional limits for tasks assessed                                  SENSATION: LT intact BUE   POSTURE: Forward head posture, B UT elevation   UPPER EXTREMITY ROM:   A/PROM Right eval Left eval R 10/29/22 R AROM 11/24/22 R AROM 12/03/22  Shoulder flexion 138 * 148 deg 149deg * 156 deg painless  Shoulder abduction 110 deg mild pain 139 deg 122 deg *  96 deg mild pain  123  Shoulder internal rotation         Shoulder external rotation         Elbow flexion         Elbow extension         Wrist flexion         Wrist extension          (Blank rows = not tested) (Key: WFL = within functional limits not formally assessed, * = concordant pain, s = stiffness/stretching sensation, NT = not tested)  Comments:     UPPER EXTREMITY MMT:   MMT Right eval Left eval R/L 11/24/22  Shoulder flexion 4 * 5 4 * / 5  Shoulder extension 4+ 4+ 5 / 5  Shoulder abduction 4* 5 5 / 5  Shoulder internal rotation 3+ 5 4 * / 5   Shoulder external rotation 4 * 5 4 * / 5   Elbow flexion        Elbow extension       Grip strength       (Blank rows = not tested)  (Key: WFL = within functional limits not formally assessed, * = concordant pain, s = stiffness/stretching sensation, NT = not tested)  Comments:    JOINT MOBILITY TESTING:  deferred   PALPATION:  Significant TTP bicipital groove, anterior deltoid, lateral supraspinatus. Generalized tightness, non tender for remainder of deltoid, LS, and UT              TODAY'S TREATMENT:     [facility] Adult PT Treatment:                                                DATE: 12/03/22 Therapeutic Exercise: Green band rows Yellow band Extensions Red band IR -pain and popping Red band ER x 8 Supine shoulder flexion x 2- pain Supine short lever flexion x 10 Side lying abduction x 10 Side lying shoulder ER - pain and popping Seated YTB ER bilat 8 x 2  Supine GTB horiz abdct 8 x 2  Standing dowel raise to 90 degrees  with cues to prevent shoulder hike  Upper trap and levaor stretches   [facility] Adult PT Treatment:                                                DATE: 11/30/22 Therapeutic Exercise: Standing swiss ball GH flexion at table, 2x12 cues for comfortable ROM and pacing YTB shoulder ext 2x12 cues for form, emphasis on keeping shoulders down RTB ER/IR iso walkout x5 each cues for posture and appropriate tension Modified counter plank 3x30sec w/ rest breaks, cues for posture and appropriate WB  Seated chest press 5# 2x8 cues for posture and form  Verbal HEP review + education   [facility] Adult PT Treatment:                                                DATE: 11/24/22 Therapeutic Exercise: GTB row x10 noted compensations at  UT cues to reduce YTB shoulder ext emphasis on shoulder mechanics and reduced UT involvement 2x8  HEP update + education, provided with yellow band for shoulder ext  Therapeutic Activity: MSK assessment + education QuickDASH + education Education/discussion re: progress with PT, symptom behavior as it affects  activity tolerance, PT goals/POC  Lifting assessment  5# grocery simulation (raised mat lift, suitcase carry 74ft and bring back), 2 laps  10# grocery simulation, 2 laps                                                                                                                                      PATIENT EDUCATION: Education details: rationale for interventions, HEP, relevant anatomy/physiology Person educated: Patient Education method: Explanation, Demonstration, Tactile cues, Verbal cues, and Handouts Education comprehension: verbalized understanding, returned demonstration, verbal cues required, tactile cues required, and needs further education     HOME EXERCISE PROGRAM: Access Code: [email or web address] URL: [email or web address] Date: 11/24/2022 Prepared by: Fransisco Hertz  Exercises - Seated Scapular Retraction  - 1 x daily - 7 x weekly - 3 sets - 10 reps - Gentle Levator Scapulae Stretch  - 1 x daily - 7 x weekly - 1-3 sets - 1-3 reps - 30sec hold - Standing Single Arm Elbow Flexion with Resistance  - 1 x daily - 7 x weekly - 2 sets - 10 reps - Doorway Pec Stretch at 90 Degrees Abduction  - 2-3 x daily - 7 x weekly - 2 sets - 1-2 reps - 30 seconds hold - Lower Trap Wall slides  - 1 x daily - 7 x weekly - 2 sets - 10 reps - Wall Push Up with Plus  - 1 x daily - 7 x weekly - 2 sets - 10 reps - 5 hold - Shoulder extension with resistance - Neutral  - 1 x daily - 7 x weekly - 2 sets - 8 reps  Shoulder External Rotation and Scapular Retraction with Resistance  - 1 x daily - 7 x weekly - 2-3 sets - 8 reps  ASSESSMENT:   CLINICAL IMPRESSION: 12/03/2022 Pt arrives with 5/10 pain. Her AROM abduction shows improvement compared with last visit. Continued with scap stab and RTC strengthening. She had some popping and pain today during side lying ER and standing IR. She did better in sitting with bilateral Shoulder ER and retraction. This was added to HEP. Also worked on shoulder  flexion without shoulder hike. This is very difficult for her.    Per eval - Pt is a pleasant 57 year old woman who arrives to PT evaluation on this date for R shoulder pain. Pt reports difficulty with housework and reaching activities due to pain. During today's session pt demonstrates mild but painful reductions in GH mobility, TTP GH musculature, and L GH weakness, which are limiting ability to perform aforementioned activities. IR musculature significantly weaker than ER and deltoid muscles. Pt tolerates exam  and HEP well without any increase in resting pain on departure. Recommend skilled PT to address aforementioned deficits to improve functional independence/tolerance. Pt departs today's session in no acute distress, all voiced questions/concerns addressed appropriately from PT perspective.     OBJECTIVE IMPAIRMENTS: decreased activity tolerance, decreased endurance, decreased mobility, decreased ROM, decreased strength, impaired UE functional use, postural dysfunction, and pain.    ACTIVITY LIMITATIONS: carrying, lifting, sleeping, bathing, dressing, reach over head, and hygiene/grooming   PARTICIPATION LIMITATIONS: meal prep, cleaning, laundry, driving, and shopping   PERSONAL FACTORS: 1-2 comorbidities: HTN, asthma  are also affecting patient's functional outcome.    REHAB POTENTIAL: Good   CLINICAL DECISION MAKING: Stable/uncomplicated   EVALUATION COMPLEXITY: Low     GOALS: Goals reviewed with patient? No   SHORT TERM GOALS: Target date: 11/04/2022 Pt will demonstrate appropriate understanding and performance of initially prescribed HEP in order to facilitate improved independence with management of symptoms.  Baseline: HEP provided on eval 11/02/22: pt endorses good compliance with HEP, no issues  Goal status: MET   2. Pt will score less than or equal to 46 on QuickDASH in order to demonstrate improved perception of function due to symptoms.            Baseline:  56.82  11/02/22: 29.54             Goal status: MET   LONG TERM GOALS: Target date: 12/22/2022  (Updated 11/24/22) Pt will score less than or equal to 36 on Quick DASH in order to indicate reduced levels of disability due to shoulder pain ([facility] 16-20pts).            Baseline: 56.82   11/02/22: 29.54  11/24/22: 22.72%            Goal status: MET   2.  Pt will demonstrate at least 145 degrees of active R shoulder elevation in order to demonstrate improved tolerance to functional movement patterns such as overhead reaching Baseline: see ROM chart above 11/24/22: see ROM chart above Goal status: ONGOING   3.  Pt will demonstrate at least 4+/5 shoulder R shoulder ER/IR MMT for improved symmetry of UE strength and improved tolerance to functional movements.  Baseline: see MMT chart above 11/24/22: see MMT chart above Goal status: PROGRESSING   4. Pt will report at least 50% reduction in waking due to shoulder pain in order to facilitate improved overall health and QOL.             Baseline: waking nightly due to pain  11/24/22: waking nightly due to rolling on shoulder            Goal status: ONGOING   5. Pt will report at least 50% decrease in overall pain levels in past week in order to facilitate improved tolerance to basic ADLs/mobility.             Baseline: 5-9/10 over past week  11/24/22: 0-10/10            Goal status: PROGRESSING    6. Pt will demonstrate appropriate performance of final prescribed HEP in order to facilitate improved self-management of symptoms post-discharge.             Baseline: initial HEP prescribed  11/24/22: good HEP adherence reported            Goal status: ONGOING   7. Pt will be able to lift/carry up to 10# with less than 3pt increase in resting pain in order to facilitate improved  tolerance to heavier activities around home such as dishes/laundry.             Baseline: increased pain w/ dishes/laundry  11/24/22: difficulty with groceries and laundry            Goal  status: ONGOING   PLAN: (updated 11/24/22 to accommodate remaining visits in POC)   PT FREQUENCY: 2x/week   PT DURATION: 4 weeks   PLANNED INTERVENTIONS: Therapeutic exercises, Therapeutic activity, Neuromuscular re-education, Balance training, Gait training, Patient/Family education, Self Care, Joint mobilization, DME instructions, Aquatic Therapy, Dry Needling, Electrical stimulation, Spinal mobilization, Cryotherapy, Moist heat, Taping, Manual therapy, and Re-evaluation   PLAN FOR NEXT SESSION: work on [employer] strengthening as able/tolerated. Work on scapulohumeral rhythm and reducing UT compensations with periscapular movement.     Jannette Spanner, PTA 12/03/22 11:02 AM Phone: [phone] Fax: [phone]

## 2022-12-08 ENCOUNTER — Ambulatory Visit: Payer: Medicaid Other | Admitting: Physical Therapy

## 2022-12-08 ENCOUNTER — Encounter: Payer: Self-pay | Admitting: Physical Therapy

## 2022-12-08 DIAGNOSIS — M6281 Muscle weakness (generalized): Secondary | ICD-10-CM

## 2022-12-08 DIAGNOSIS — M25511 Pain in right shoulder: Secondary | ICD-10-CM | POA: Diagnosis not present

## 2022-12-08 DIAGNOSIS — M25611 Stiffness of right shoulder, not elsewhere classified: Secondary | ICD-10-CM | POA: Diagnosis not present

## 2022-12-08 NOTE — Therapy (Signed)
OUTPATIENT PHYSICAL THERAPY TREATMENT NOTE    Patient Name: Tammie Solis MRN: [id number] DOB:1966-03-10, 57 y.o., female Today's Date: 12/08/2022  PCP: Rudene Christians, DO REFERRING PROVIDER: Mercie Eon, MD  END OF SESSION:   PT End of Session - 12/08/22 1403     Visit Number 8    Number of Visits 13    Date for PT Re-Evaluation 12/22/22    Authorization Type [facility]    Authorization Time Period 10/26/22-12/25/22    Authorization - Visit Number 7    Authorization - Number of Visits 10    PT Start Time 1405    PT Stop Time 1451    PT Time Calculation (min) 46 min    Activity Tolerance Patient tolerated treatment well;No increased pain    Behavior During Therapy WFL for tasks assessed/performed                  Past Medical History:  Diagnosis Date   Allergy    Anemia    Arthritis    knees    Asthma    GERD (gastroesophageal reflux disease)    occasionally    Neuromuscular disorder (HCC)    sciatica left leg   Past Surgical History:  Procedure Laterality Date   CESAREAN SECTION     x2   Patient Active Problem List   Diagnosis Date Noted   Occipital neuralgia of left side 11/23/2022   Shoulder pain, right 09/10/2022   Vasomotor symptoms due to menopause 06/03/2022   Prediabetes 06/03/2022   Need for tetanus booster 01/29/2022   Fall 01/29/2022   Foot laceration, right, initial encounter 01/29/2022   Housing instability, currently housed, at risk for homelessness 09/05/2021   Mononeuropathy 09/05/2021   Thyroid nodule 07/05/2021   Polyneuropathy 07/05/2021   Cough 05/29/2021   Asthma 05/29/2021   Superficial phlebitis 03/17/2021   Left leg pain 02/13/2021   Health care maintenance 01/09/2021   Hyperlipidemia 07/30/2020   Osteoarthritis of left knee 07/26/2020   Hypertension 03/12/2020   Osteoarthritis of left hip 03/12/2020   Morbid obesity (HCC) 03/12/2020   Iron deficiency anemia 03/12/2020    REFERRING DIAG: M25.511 (ICD-10-CM) -  Acute pain of right shoulder  THERAPY DIAG:  Right shoulder pain, unspecified chronicity  Muscle weakness (generalized)  Stiffness of right shoulder, not elsewhere classified  Rationale for Evaluation and Treatment Rehabilitation  PERTINENT HISTORY: HTN, asthma   PRECAUTIONS: none  SUBJECTIVE STATEMENT:   12/08/2022 Pt states she did have some increased pain during and after last session but back to baseline by next day. 1/10 pain at present, HEP going well. Still no headaches.    Hand dominance: Right  PAIN:  Are you having pain: 1/10 Location/description: R shoulder, anteriorly; achey/throbbing  Best-worst in past week: 0-5/10  Per eval -  Best-worst over past week: 5-9/10  - aggravating factors: reaching overhead, lifting - Easing factors: medication, rest   OBJECTIVE: (objective measures completed at initial evaluation unless otherwise dated)   DIAGNOSTIC FINDINGS:  No recent shoulder imaging per EPIC or pt    PATIENT SURVEYS:  Quick Dash 56.82 QuickDASH 11/02/22: 29.54  Quickdash 11/24/22: 22.72%   COGNITION: Overall cognitive status: Within functional limits for tasks assessed                                  SENSATION: LT intact BUE   POSTURE: Forward head posture, B UT elevation   UPPER EXTREMITY  ROM:   A/PROM Right eval Left eval R 10/29/22 R AROM 11/24/22 R AROM 12/03/22  Shoulder flexion 138 * 148 deg 149deg * 156 deg painless   Shoulder abduction 110 deg mild pain 139 deg 122 deg *  96 deg mild pain  123  Shoulder internal rotation         Shoulder external rotation         Elbow flexion         Elbow extension         Wrist flexion         Wrist extension          (Blank rows = not tested) (Key: WFL = within functional limits not formally assessed, * = concordant pain, s = stiffness/stretching sensation, NT = not tested)  Comments:     UPPER EXTREMITY MMT:   MMT Right eval Left eval R/L 11/24/22  Shoulder flexion 4 * 5 4 * / 5  Shoulder  extension 4+ 4+ 5 / 5  Shoulder abduction 4* 5 5 / 5  Shoulder internal rotation 3+ 5 4 * / 5   Shoulder external rotation 4 * 5 4 * / 5   Elbow flexion       Elbow extension       Grip strength       (Blank rows = not tested)  (Key: WFL = within functional limits not formally assessed, * = concordant pain, s = stiffness/stretching sensation, NT = not tested)  Comments:    JOINT MOBILITY TESTING:  deferred   PALPATION:  Significant TTP bicipital groove, anterior deltoid, lateral supraspinatus. Generalized tightness, non tender for remainder of deltoid, LS, and UT              TODAY'S TREATMENT:     [facility] Adult PT Treatment:                                                DATE: 12/08/22 Therapeutic Exercise: GTB row 2x12 cues for reduced UT compensations  RTB shoulders 2x12 cues for posture and head position RTB IR/ER iso walkouts 2x5 each tactile/verbal cues as needed  to avoid compensations at elbow Swiss ball up wall GH flexion 2x8 cues for comfortable ROM, second set with cues for gentle push w/ emphasis on lat involvement Shoulder flexion iso at wall 2x5 with 5 sec hold cues for form and reduced compensations at elbow  Supine shoulder flexion (d/c after one rep due to eccentric pain) Seated shoulder flexion AAROM w/ dowel x12 cues for head positioning and thoracic extension, reduced UT compensation   [facility] Adult PT Treatment:                                                DATE: 12/03/22 Therapeutic Exercise: Green band rows Yellow band Extensions Red band IR -pain and popping Red band ER x 8 Supine shoulder flexion x 2- pain Supine short lever flexion x 10 Side lying abduction x 10 Side lying shoulder ER - pain and popping Seated YTB ER bilat 8 x 2  Supine GTB horiz abdct 8 x 2  Standing dowel raise to 90 degrees  with cues to prevent shoulder hike  Upper trap  and levaor stretches   [facility] Adult PT Treatment:                                                DATE:  11/30/22 Therapeutic Exercise: Standing swiss ball GH flexion at table, 2x12 cues for comfortable ROM and pacing YTB shoulder ext 2x12 cues for form, emphasis on keeping shoulders down RTB ER/IR iso walkout x5 each cues for posture and appropriate tension Modified counter plank 3x30sec w/ rest breaks, cues for posture and appropriate WB  Seated chest press 5# 2x8 cues for posture and form  Verbal HEP review + education   [facility] Adult PT Treatment:                                                DATE: 11/24/22 Therapeutic Exercise: GTB row x10 noted compensations at UT cues to reduce YTB shoulder ext emphasis on shoulder mechanics and reduced UT involvement 2x8  HEP update + education, provided with yellow band for shoulder ext  Therapeutic Activity: MSK assessment + education QuickDASH + education Education/discussion re: progress with PT, symptom behavior as it affects activity tolerance, PT goals/POC  Lifting assessment  5# grocery simulation (raised mat lift, suitcase carry 31ft and bring back), 2 laps  10# grocery simulation, 2 laps                                                                                                                                      PATIENT EDUCATION: Education details: rationale for interventions, HEP, relevant anatomy/physiology Person educated: Patient Education method: Explanation, Demonstration, Tactile cues, Verbal cues, and Handouts Education comprehension: verbalized understanding, returned demonstration, verbal cues required, tactile cues required, and needs further education     HOME EXERCISE PROGRAM: Access Code: [email or web address] URL: [email or web address] Date: 11/24/2022 Prepared by: Fransisco Hertz  Exercises - Seated Scapular Retraction  - 1 x daily - 7 x weekly - 3 sets - 10 reps - Gentle Levator Scapulae Stretch  - 1 x daily - 7 x weekly - 1-3 sets - 1-3 reps - 30sec hold - Standing Single Arm Elbow Flexion with Resistance  -  1 x daily - 7 x weekly - 2 sets - 10 reps - Doorway Pec Stretch at 90 Degrees Abduction  - 2-3 x daily - 7 x weekly - 2 sets - 1-2 reps - 30 seconds hold - Lower Trap Wall slides  - 1 x daily - 7 x weekly - 2 sets - 10 reps - Wall Push Up with Plus  - 1 x daily - 7 x weekly - 2 sets - 10 reps - 5 hold - Shoulder extension  with resistance - Neutral  - 1 x daily - 7 x weekly - 2 sets - 8 reps  Shoulder External Rotation and Scapular Retraction with Resistance  - 1 x daily - 7 x weekly - 2-3 sets - 8 reps  ASSESSMENT:   CLINICAL IMPRESSION: 12/08/2022 Pt arrives w/ 1/10 pain on NPS, a bit of pain after last session but improved by next day. Pt with improved tolerance today, modifications made for rotator cuff strengthening as she states ER/IR was irritating last session. Cues as above, continues to demo compensations with UT for many periscapular movements. No increase in pain on departure although supine shoulder flexion AAROM does transiently increase pain. Recommend assessing goals next session to determine progress thus far and next steps with POC. Pt departs today's session in no acute distress, all voiced questions/concerns addressed appropriately from PT perspective.     Per eval - Pt is a pleasant 57 year old woman who arrives to PT evaluation on this date for R shoulder pain. Pt reports difficulty with housework and reaching activities due to pain. During today's session pt demonstrates mild but painful reductions in GH mobility, TTP GH musculature, and L GH weakness, which are limiting ability to perform aforementioned activities. IR musculature significantly weaker than ER and deltoid muscles. Pt tolerates exam and HEP well without any increase in resting pain on departure. Recommend skilled PT to address aforementioned deficits to improve functional independence/tolerance. Pt departs today's session in no acute distress, all voiced questions/concerns addressed appropriately from PT perspective.      OBJECTIVE IMPAIRMENTS: decreased activity tolerance, decreased endurance, decreased mobility, decreased ROM, decreased strength, impaired UE functional use, postural dysfunction, and pain.    ACTIVITY LIMITATIONS: carrying, lifting, sleeping, bathing, dressing, reach over head, and hygiene/grooming   PARTICIPATION LIMITATIONS: meal prep, cleaning, laundry, driving, and shopping   PERSONAL FACTORS: 1-2 comorbidities: HTN, asthma  are also affecting patient's functional outcome.    REHAB POTENTIAL: Good   CLINICAL DECISION MAKING: Stable/uncomplicated   EVALUATION COMPLEXITY: Low     GOALS: Goals reviewed with patient? No   SHORT TERM GOALS: Target date: 11/04/2022 Pt will demonstrate appropriate understanding and performance of initially prescribed HEP in order to facilitate improved independence with management of symptoms.  Baseline: HEP provided on eval 11/02/22: pt endorses good compliance with HEP, no issues  Goal status: MET   2. Pt will score less than or equal to 46 on QuickDASH in order to demonstrate improved perception of function due to symptoms.            Baseline: 56.82  11/02/22: 29.54             Goal status: MET   LONG TERM GOALS: Target date: 12/22/2022  (Updated 11/24/22) Pt will score less than or equal to 36 on Quick DASH in order to indicate reduced levels of disability due to shoulder pain ([facility] 16-20pts).            Baseline: 56.82   11/02/22: 29.54  11/24/22: 22.72%            Goal status: MET   2.  Pt will demonstrate at least 145 degrees of active R shoulder elevation in order to demonstrate improved tolerance to functional movement patterns such as overhead reaching Baseline: see ROM chart above 11/24/22: see ROM chart above Goal status: ONGOING   3.  Pt will demonstrate at least 4+/5 shoulder R shoulder ER/IR MMT for improved symmetry of UE strength and improved tolerance to functional  movements.  Baseline: see MMT chart above 11/24/22: see MMT chart  above Goal status: PROGRESSING   4. Pt will report at least 50% reduction in waking due to shoulder pain in order to facilitate improved overall health and QOL.             Baseline: waking nightly due to pain  11/24/22: waking nightly due to rolling on shoulder            Goal status: ONGOING   5. Pt will report at least 50% decrease in overall pain levels in past week in order to facilitate improved tolerance to basic ADLs/mobility.             Baseline: 5-9/10 over past week  11/24/22: 0-10/10            Goal status: PROGRESSING    6. Pt will demonstrate appropriate performance of final prescribed HEP in order to facilitate improved self-management of symptoms post-discharge.             Baseline: initial HEP prescribed  11/24/22: good HEP adherence reported            Goal status: ONGOING   7. Pt will be able to lift/carry up to 10# with less than 3pt increase in resting pain in order to facilitate improved tolerance to heavier activities around home such as dishes/laundry.             Baseline: increased pain w/ dishes/laundry  11/24/22: difficulty with groceries and laundry            Goal status: ONGOING   PLAN: (updated 11/24/22 to accommodate remaining visits in POC)   PT FREQUENCY: 2x/week   PT DURATION: 4 weeks   PLANNED INTERVENTIONS: Therapeutic exercises, Therapeutic activity, Neuromuscular re-education, Balance training, Gait training, Patient/Family education, Self Care, Joint mobilization, DME instructions, Aquatic Therapy, Dry Needling, Electrical stimulation, Spinal mobilization, Cryotherapy, Moist heat, Taping, Manual therapy, and Re-evaluation   PLAN FOR NEXT SESSION: work on [employer] strengthening as able/tolerated. Work on scapulohumeral rhythm and reducing UT compensations with periscapular movement.      Ashley Murrain PT, DPT 12/08/2022 2:53 PM

## 2022-12-14 NOTE — Therapy (Signed)
OUTPATIENT PHYSICAL THERAPY TREATMENT NOTE    Patient Name: Tammie Solis MRN: [id number] DOB:[birth year], 57 y.o., female Today's Date: 12/14/2022  PCP: Tammie Christians, DO REFERRING PROVIDER: Mercie Eon, MD  END OF SESSION:          Past Medical History:  Diagnosis Date   Allergy    Anemia    Arthritis    knees    Asthma    GERD (gastroesophageal reflux disease)    occasionally    Neuromuscular disorder (HCC)    sciatica left leg   Past Surgical History:  Procedure Laterality Date   CESAREAN SECTION     x2   Patient Active Problem List   Diagnosis Date Noted   Occipital neuralgia of left side 11/23/2022   Shoulder pain, right 09/10/2022   Vasomotor symptoms due to menopause 06/03/2022   Prediabetes 06/03/2022   Need for tetanus booster 01/29/2022   Fall 01/29/2022   Foot laceration, right, initial encounter 01/29/2022   Housing instability, currently housed, at risk for homelessness 09/05/2021   Mononeuropathy 09/05/2021   Thyroid nodule 07/05/2021   Polyneuropathy 07/05/2021   Cough 05/29/2021   Asthma 05/29/2021   Superficial phlebitis 03/17/2021   Left leg pain 02/13/2021   Health care maintenance 01/09/2021   Hyperlipidemia 07/30/2020   Osteoarthritis of left knee 07/26/2020   Hypertension 03/12/2020   Osteoarthritis of left hip 03/12/2020   Morbid obesity (HCC) 03/12/2020   Iron deficiency anemia 03/12/2020    REFERRING DIAG: M25.511 (ICD-10-CM) - Acute pain of right shoulder  THERAPY DIAG:  No diagnosis found.  Rationale for Evaluation and Treatment Rehabilitation  PERTINENT HISTORY: HTN, asthma   PRECAUTIONS: none  SUBJECTIVE STATEMENT:   12/14/2022 *** QUICKDASH AND GOALS  *** Pt states she did have some increased pain during and after last session but back to baseline by next day. 1/10 pain at present, HEP going well. Still no headaches.    Hand dominance: Right  PAIN:  Are you having pain: 1/10 Location/description: R  shoulder, anteriorly; achey/throbbing  Best-worst in past week: 0-5/10  Per eval -  Best-worst over past week: 5-9/10  - aggravating factors: reaching overhead, lifting - Easing factors: medication, rest   OBJECTIVE: (objective measures completed at initial evaluation unless otherwise dated)   DIAGNOSTIC FINDINGS:  No recent shoulder imaging per EPIC or pt    PATIENT SURVEYS:  Quick Dash 56.82 QuickDASH 11/02/22: 29.54  Quickdash 11/24/22: 22.72% Tammie Solis 12/15/22: ***    COGNITION: Overall cognitive status: Within functional limits for tasks assessed                                  SENSATION: LT intact BUE   POSTURE: Forward head posture, B UT elevation   UPPER EXTREMITY ROM:   A/PROM Right eval Left eval R 10/29/22 R AROM 11/24/22 R AROM 12/03/22  Shoulder flexion 138 * 148 deg 149deg * 156 deg painless   Shoulder abduction 110 deg mild pain 139 deg 122 deg *  96 deg mild pain  123  Shoulder internal rotation         Shoulder external rotation         Elbow flexion         Elbow extension         Wrist flexion         Wrist extension          (Blank rows = not tested) (Key:  WFL = within functional limits not formally assessed, * = concordant pain, s = stiffness/stretching sensation, NT = not tested)  Comments:     UPPER EXTREMITY MMT:   MMT Right eval Left eval R/L 11/24/22  Shoulder flexion 4 * 5 4 * / 5  Shoulder extension 4+ 4+ 5 / 5  Shoulder abduction 4* 5 5 / 5  Shoulder internal rotation 3+ 5 4 * / 5   Shoulder external rotation 4 * 5 4 * / 5   Elbow flexion       Elbow extension       Grip strength       (Blank rows = not tested)  (Key: WFL = within functional limits not formally assessed, * = concordant pain, s = stiffness/stretching sensation, NT = not tested)  Comments:    JOINT MOBILITY TESTING:  deferred   PALPATION:  Significant TTP bicipital groove, anterior deltoid, lateral supraspinatus. Generalized tightness, non tender for remainder of  deltoid, LS, and UT              TODAY'S TREATMENT:     [facility] Adult PT Treatment:                                                DATE: 12/15/22 Therapeutic Exercise: *** Manual Therapy: *** Neuromuscular re-ed: *** Therapeutic Activity: *** Modalities: *** Self Care: ***   Tammie Solis Adult PT Treatment:                                                DATE: 12/08/22 Therapeutic Exercise: GTB row 2x12 cues for reduced UT compensations  RTB shoulders 2x12 cues for posture and head position RTB IR/ER iso walkouts 2x5 each tactile/verbal cues as needed  to avoid compensations at elbow Swiss ball up wall GH flexion 2x8 cues for comfortable ROM, second set with cues for gentle push w/ emphasis on lat involvement Shoulder flexion iso at wall 2x5 with 5 sec hold cues for form and reduced compensations at elbow  Supine shoulder flexion (d/c after one rep due to eccentric pain) Seated shoulder flexion AAROM w/ dowel x12 cues for head positioning and thoracic extension, reduced UT compensation   [facility] Adult PT Treatment:                                                DATE: 12/03/22 Therapeutic Exercise: Green band rows Yellow band Extensions Red band IR -pain and popping Red band ER x 8 Supine shoulder flexion x 2- pain Supine short lever flexion x 10 Side lying abduction x 10 Side lying shoulder ER - pain and popping Seated YTB ER bilat 8 x 2  Supine GTB horiz abdct 8 x 2  Standing dowel raise to 90 degrees  with cues to prevent shoulder hike  Upper trap and levaor stretches   [facility] Adult PT Treatment:  DATE: 11/30/22 Therapeutic Exercise: Standing swiss ball GH flexion at table, 2x12 cues for comfortable ROM and pacing YTB shoulder ext 2x12 cues for form, emphasis on keeping shoulders down RTB ER/IR iso walkout x5 each cues for posture and appropriate tension Modified counter plank 3x30sec w/ rest breaks, cues for posture and appropriate WB   Seated chest press 5# 2x8 cues for posture and form  Verbal HEP review + education   [facility] Adult PT Treatment:                                                DATE: 11/24/22 Therapeutic Exercise: GTB row x10 noted compensations at UT cues to reduce YTB shoulder ext emphasis on shoulder mechanics and reduced UT involvement 2x8  HEP update + education, provided with yellow band for shoulder ext  Therapeutic Activity: MSK assessment + education QuickDASH + education Education/discussion re: progress with PT, symptom behavior as it affects activity tolerance, PT goals/POC  Lifting assessment  5# grocery simulation (raised mat lift, suitcase carry 86ft and bring back), 2 laps  10# grocery simulation, 2 laps                                                                                                                                      PATIENT EDUCATION: Education details: rationale for interventions, HEP, relevant anatomy/physiology Person educated: Patient Education method: Explanation, Demonstration, Tactile cues, Verbal cues, and Handouts Education comprehension: verbalized understanding, returned demonstration, verbal cues required, tactile cues required, and needs further education     HOME EXERCISE PROGRAM: Access Code: [email or web address] URL: [email or web address] Date: 11/24/2022 Prepared by: Fransisco Hertz  Exercises - Seated Scapular Retraction  - 1 x daily - 7 x weekly - 3 sets - 10 reps - Gentle Levator Scapulae Stretch  - 1 x daily - 7 x weekly - 1-3 sets - 1-3 reps - 30sec hold - Standing Single Arm Elbow Flexion with Resistance  - 1 x daily - 7 x weekly - 2 sets - 10 reps - Doorway Pec Stretch at 90 Degrees Abduction  - 2-3 x daily - 7 x weekly - 2 sets - 1-2 reps - 30 seconds hold - Lower Trap Wall slides  - 1 x daily - 7 x weekly - 2 sets - 10 reps - Wall Push Up with Plus  - 1 x daily - 7 x weekly - 2 sets - 10 reps - 5 hold - Shoulder extension with resistance  - Neutral  - 1 x daily - 7 x weekly - 2 sets - 8 reps  Shoulder External Rotation and Scapular Retraction with Resistance  - 1 x daily - 7 x weekly - 2-3 sets - 8 reps  ASSESSMENT:   CLINICAL IMPRESSION: 12/14/2022 ***  *** Pt arrives w/  1/10 pain on NPS, a bit of pain after last session but improved by next day. Pt with improved tolerance today, modifications made for rotator cuff strengthening as she states ER/IR was irritating last session. Cues as above, continues to demo compensations with UT for many periscapular movements. No increase in pain on departure although supine shoulder flexion AAROM does transiently increase pain. Recommend assessing goals next session to determine progress thus far and next steps with POC. Pt departs today's session in no acute distress, all voiced questions/concerns addressed appropriately from PT perspective.     Per eval - Pt is a pleasant 57 year old woman who arrives to PT evaluation on this date for R shoulder pain. Pt reports difficulty with housework and reaching activities due to pain. During today's session pt demonstrates mild but painful reductions in GH mobility, TTP GH musculature, and L GH weakness, which are limiting ability to perform aforementioned activities. IR musculature significantly weaker than ER and deltoid muscles. Pt tolerates exam and HEP well without any increase in resting pain on departure. Recommend skilled PT to address aforementioned deficits to improve functional independence/tolerance. Pt departs today's session in no acute distress, all voiced questions/concerns addressed appropriately from PT perspective.     OBJECTIVE IMPAIRMENTS: decreased activity tolerance, decreased endurance, decreased mobility, decreased ROM, decreased strength, impaired UE functional use, postural dysfunction, and pain.    ACTIVITY LIMITATIONS: carrying, lifting, sleeping, bathing, dressing, reach over head, and hygiene/grooming   PARTICIPATION  LIMITATIONS: meal prep, cleaning, laundry, driving, and shopping   PERSONAL FACTORS: 1-2 comorbidities: HTN, asthma  are also affecting patient's functional outcome.    REHAB POTENTIAL: Good   CLINICAL DECISION MAKING: Stable/uncomplicated   EVALUATION COMPLEXITY: Low     GOALS: Goals reviewed with patient? No   SHORT TERM GOALS: Target date: 11/04/2022 Pt will demonstrate appropriate understanding and performance of initially prescribed HEP in order to facilitate improved independence with management of symptoms.  Baseline: HEP provided on eval 11/02/22: pt endorses good compliance with HEP, no issues  Goal status: MET   2. Pt will score less than or equal to 46 on QuickDASH in order to demonstrate improved perception of function due to symptoms.            Baseline: 56.82  11/02/22: 29.54             Goal status: MET   LONG TERM GOALS: Target date: 12/22/2022  (Updated 11/24/22) Pt will score less than or equal to 36 on Quick DASH in order to indicate reduced levels of disability due to shoulder pain (MDC 16-20pts).            Baseline: 56.82   11/02/22: 29.54  11/24/22: 22.72%            Goal status: MET   2.  Pt will demonstrate at least 145 degrees of active R shoulder elevation in order to demonstrate improved tolerance to functional movement patterns such as overhead reaching Baseline: see ROM chart above 11/24/22: see ROM chart above Goal status: ONGOING   3.  Pt will demonstrate at least 4+/5 shoulder R shoulder ER/IR MMT for improved symmetry of UE strength and improved tolerance to functional movements.  Baseline: see MMT chart above 11/24/22: see MMT chart above Goal status: PROGRESSING   4. Pt will report at least 50% reduction in waking due to shoulder pain in order to facilitate improved overall health and QOL.             Baseline:  waking nightly due to pain  11/24/22: waking nightly due to rolling on shoulder            Goal status: ONGOING   5. Pt will report at  least 50% decrease in overall pain levels in past week in order to facilitate improved tolerance to basic ADLs/mobility.             Baseline: 5-9/10 over past week  11/24/22: 0-10/10            Goal status: PROGRESSING    6. Pt will demonstrate appropriate performance of final prescribed HEP in order to facilitate improved self-management of symptoms post-discharge.             Baseline: initial HEP prescribed  11/24/22: good HEP adherence reported            Goal status: ONGOING   7. Pt will be able to lift/carry up to 10# with less than 3pt increase in resting pain in order to facilitate improved tolerance to heavier activities around home such as dishes/laundry.             Baseline: increased pain w/ dishes/laundry  11/24/22: difficulty with groceries and laundry            Goal status: ONGOING   PLAN: (updated 11/24/22 to accommodate remaining visits in POC)   PT FREQUENCY: 2x/week   PT DURATION: 4 weeks   PLANNED INTERVENTIONS: Therapeutic exercises, Therapeutic activity, Neuromuscular re-education, Balance training, Gait training, Patient/Family education, Self Care, Joint mobilization, DME instructions, Aquatic Therapy, Dry Needling, Electrical stimulation, Spinal mobilization, Cryotherapy, Moist heat, Taping, Manual therapy, and Re-evaluation   PLAN FOR NEXT SESSION: work on [employer] strengthening as able/tolerated. Work on scapulohumeral rhythm and reducing UT compensations with periscapular movement.   ***    Ashley Murrain PT, DPT 12/14/2022 10:31 AM

## 2022-12-15 ENCOUNTER — Ambulatory Visit: Payer: Medicaid Other | Admitting: Physical Therapy

## 2022-12-15 ENCOUNTER — Encounter: Payer: Self-pay | Admitting: Physical Therapy

## 2022-12-15 DIAGNOSIS — M25511 Pain in right shoulder: Secondary | ICD-10-CM | POA: Diagnosis not present

## 2022-12-15 DIAGNOSIS — M25611 Stiffness of right shoulder, not elsewhere classified: Secondary | ICD-10-CM | POA: Diagnosis not present

## 2022-12-15 DIAGNOSIS — M6281 Muscle weakness (generalized): Secondary | ICD-10-CM

## 2022-12-15 DIAGNOSIS — Z7689 Persons encountering health services in other specified circumstances: Secondary | ICD-10-CM | POA: Diagnosis not present

## 2022-12-21 DIAGNOSIS — Z419 Encounter for procedure for purposes other than remedying health state, unspecified: Secondary | ICD-10-CM | POA: Diagnosis not present

## 2022-12-23 ENCOUNTER — Telehealth: Payer: Self-pay

## 2022-12-23 NOTE — Telephone Encounter (Signed)
Chart review completed for patient. Patient is due for screening mammogram. Mychart message sent to patient to inquire about scheduling mammogram.  Alylah Blakney, Population Health Specialist.  

## 2023-01-13 ENCOUNTER — Ambulatory Visit: Payer: Medicaid Other | Admitting: Student

## 2023-01-13 ENCOUNTER — Other Ambulatory Visit (HOSPITAL_COMMUNITY): Payer: Self-pay

## 2023-01-13 ENCOUNTER — Encounter: Payer: Self-pay | Admitting: Student

## 2023-01-13 VITALS — BP 155/64 | HR 61 | Temp 98.4°F | Wt 275.6 lb

## 2023-01-13 DIAGNOSIS — M25511 Pain in right shoulder: Secondary | ICD-10-CM

## 2023-01-13 DIAGNOSIS — I10 Essential (primary) hypertension: Secondary | ICD-10-CM | POA: Diagnosis not present

## 2023-01-13 DIAGNOSIS — M1712 Unilateral primary osteoarthritis, left knee: Secondary | ICD-10-CM | POA: Diagnosis not present

## 2023-01-13 DIAGNOSIS — Z5986 Financial insecurity: Secondary | ICD-10-CM | POA: Diagnosis not present

## 2023-01-13 DIAGNOSIS — Z59811 Housing instability, housed, with risk of homelessness: Secondary | ICD-10-CM

## 2023-01-13 DIAGNOSIS — G8929 Other chronic pain: Secondary | ICD-10-CM

## 2023-01-13 DIAGNOSIS — Z1231 Encounter for screening mammogram for malignant neoplasm of breast: Secondary | ICD-10-CM

## 2023-01-13 MED ORDER — DICLOFENAC SODIUM 1 % EX GEL
2.0000 g | Freq: Four times a day (QID) | CUTANEOUS | 3 refills | Status: DC
  Filled 2023-01-13: qty 100, 10d supply, fill #0

## 2023-01-13 NOTE — Assessment & Plan Note (Signed)
Chronic pain exacerbated by recent circumstances: eviction and sudden move. She has been on the go all day for the last few days. On exam, no acute instability. There is tenderness at the rotator cuff and AC joint. PLAN: Conservative management with NSAIDs and voltaren gel. She sees physical therapy for this. Does not wish to try injections, tried once in past and was dissatisfied.

## 2023-01-13 NOTE — Assessment & Plan Note (Signed)
She has been off HTN medicines for many weeks due to an inability to afford her medicine. Discussed this with her pharmacy. At the moment they will dispense formulary medicines at $4 and will allow her to pay bill later when able.

## 2023-01-13 NOTE — Progress Notes (Signed)
CC: Pain in knee and shoulder  HPI:  Tammie.Tammie Solis is a 57 y.o. female living with a history stated below and presents today for pain. Please see problem based assessment and plan for additional details.  Past Medical History:  Diagnosis Date   Allergy    Anemia    Arthritis    knees    Asthma    GERD (gastroesophageal reflux disease)    occasionally    Neuromuscular disorder (HCC)    sciatica left leg    Current Outpatient Medications on File Prior to Visit  Medication Sig Dispense Refill   acetaminophen (TYLENOL) 650 MG CR tablet Take 1 tablet (650 mg total) by mouth every 8 (eight) hours as needed for pain. 60 tablet 0   albuterol (VENTOLIN HFA) 108 (90 Base) MCG/ACT inhaler Inhale 2 puffs into the lungs every 4 (four) hours as needed for wheezing or shortness of breath. 1 each 0   budesonide-formoterol (SYMBICORT) 80-4.5 MCG/ACT inhaler Inhale 2 puffs into the lungs 2 (two) times daily. 10.2 g 12   ibuprofen (ADVIL) 800 MG tablet Take 1 tablet (800 mg total) by mouth every 8 (eight) hours as needed. 10 tablet 0   lisinopril-hydrochlorothiazide (ZESTORETIC) 20-12.5 MG tablet Take 2 tablets by mouth daily. 60 tablet 11   Loratadine 10 MG CAPS Take by mouth.     Multiple Vitamins-Minerals (CENTRUM SILVER 50+WOMEN PO) Take 1 each by mouth daily.     Probiotic Product (RA PROBIOTIC GUMMIES PO) Take by mouth daily.     rosuvastatin (CRESTOR) 20 MG tablet Take 1 tablet (20 mg total) by mouth daily. 30 tablet 11   Current Facility-Administered Medications on File Prior to Visit  Medication Dose Route Frequency Provider Last Rate Last Admin   lidocaine (PF) (XYLOCAINE) 1 % injection 2 mL  2 mL Other Once Gust Rung, DO        Family History  Problem Relation Age of Onset   Diabetes Mother    Kidney failure Mother    Cancer Father        prostate   Prostate cancer Father    Colon cancer Neg Hx    Colon polyps Neg Hx    Esophageal cancer Neg Hx    Rectal cancer Neg Hx     Stomach cancer Neg Hx    Pancreatic cancer Neg Hx     Social History   Socioeconomic History   Marital status: Single    Spouse name: Not on file   Number of children: Not on file   Years of education: Not on file   Highest education level: Not on file  Occupational History   Not on file  Tobacco Use   Smoking status: Never   Smokeless tobacco: Never  Substance and Sexual Activity   Alcohol use: No   Drug use: No   Sexual activity: Never    Birth control/protection: None  Other Topics Concern   Not on file  Social History Narrative   Not on file   Social Determinants of Health   Financial Resource Strain: High Risk (09/29/2021)   Overall Financial Resource Strain (CARDIA)    Difficulty of Paying Living Expenses: Hard  Food Insecurity: No Food Insecurity (09/10/2022)   Hunger Vital Sign    Worried About Running Out of Food in the Last Year: Never true    Ran Out of Food in the Last Year: Never true  Transportation Needs: No Transportation Needs (09/10/2022)   PRAPARE - Transportation  Lack of Transportation (Medical): No    Lack of Transportation (Non-Medical): No  Physical Activity: Not on file  Stress: Not on file  Social Connections: Moderately Isolated (06/03/2022)   Social Connection and Isolation Panel [NHANES]    Frequency of Communication with Friends and Family: More than three times a week    Frequency of Social Gatherings with Friends and Family: More than three times a week    Attends Religious Services: Never    Database administrator or Organizations: Yes    Attends Banker Meetings: Never    Marital Status: Never married  Intimate Partner Violence: Not At Risk (09/10/2022)   Humiliation, Afraid, Rape, and Kick questionnaire    Fear of Current or Ex-Partner: No    Emotionally Abused: No    Physically Abused: No    Sexually Abused: No    Review of Systems: ROS negative except for what is noted on the assessment and plan.  Vitals:    01/13/23 1309 01/13/23 1415  BP: (!) 162/72 (!) 155/64  Pulse: 71 61  Temp:  98.4 F (36.9 C)  TempSrc:  Oral  SpO2: 99%   Weight: 275 lb 9.6 oz (125 kg)     Physical Exam: Constitutional: well-appearing female sitting in chair, in no acute distress HENT: normocephalic atraumatic, mucous membranes moist Eyes: conjunctiva non-erythematous Cardiovascular: regular rate and rhythm, no m/r/g Pulmonary/Chest: normal work of breathing on room air, lungs clear to auscultation bilaterally Abdominal: soft, non-tender, non-distended MSK: normal bulk and tone Neurological: alert & oriented x 3, no focal deficit Skin: warm and dry Psych: normal mood and behavior  Assessment & Plan:   Patient seen with Dr. Mayford Knife  Housing instability, currently housed, at risk for homelessness Tammie Solis was recently evicted from her home and has been in the process of a move these past few days. She is currently staying with a friend but it is unclear how long that will remain an option for her. She is understandably under great stress and she believes her pain is in part due to this. Tearful today, will monitor for long-term psychiatric distress going forward. PLAN: Refer to behavioral health, Tammie Solis, Social work consult made at last visit.  Financial insecurity She has been off HTN medicines for many weeks due to an inability to afford her medicine. Discussed this with her pharmacy. At the moment they will dispense formulary medicines at $4 and will allow her to pay bill later when able.  Shoulder pain, right Chronic pain exacerbated by recent circumstances: eviction and sudden move. She has been on the go all day for the last few days. On exam, no acute instability. There is tenderness at the rotator cuff and AC joint. PLAN: Conservative management with NSAIDs and voltaren gel. She sees physical therapy for this. Does not wish to try injections, tried once in past and was dissatisfied.  Osteoarthritis of  left knee Chronic pain exacerbated by recent circumstances: eviction and sudden move. She has been on the go all day for the last few days. On exam, no acute instability. There is tenderness at the lateral joint line but there is no acute swelling PLAN: Conservative management with NSAIDs and voltaren gel. Does not wish to try injections.  Hypertension Uncontrolled having been off medicine for many weeks. Unfortunately it is financial strain that is causing this. Discussed with pharmacy, per above.  Katheran James, D.O. [facility] Internal Medicine, PGY-1 Phone: [phone] Date 01/13/2023 Time 3:27 PM

## 2023-01-13 NOTE — Assessment & Plan Note (Signed)
Ms Sublette was recently evicted from her home and has been in the process of a move these past few days. She is currently staying with a friend but it is unclear how long that will remain an option for her. She is understandably under great stress and she believes her pain is in part due to this. Tearful today, will monitor for long-term psychiatric distress going forward. PLAN: Refer to behavioral health, Marena Chancy, Social work consult made at last visit.

## 2023-01-13 NOTE — Assessment & Plan Note (Signed)
Chronic pain exacerbated by recent circumstances: eviction and sudden move. She has been on the go all day for the last few days. On exam, no acute instability. There is tenderness at the lateral joint line but there is no acute swelling PLAN: Conservative management with NSAIDs and voltaren gel. Does not wish to try injections.

## 2023-01-13 NOTE — Assessment & Plan Note (Signed)
Uncontrolled having been off medicine for many weeks. Unfortunately it is financial strain that is causing this. Discussed with pharmacy, per above.

## 2023-01-13 NOTE — Patient Instructions (Signed)
Tammie Solis,  Thank you for coming in today. This after visit summary is an important review of tests, referrals, and medication changes that were discussed during your visit. If you have questions or concerns, call the clinic at [phone] between 9am - 4pm. Outside of clinic business hours, call the main hospital at [phone] and ask the operator for the on-call internal medicine resident.  Expect a call from [facility] in the next few days. Call back at any time if we can assist you.  Best, Dr. Katheran James

## 2023-01-15 ENCOUNTER — Other Ambulatory Visit (HOSPITAL_COMMUNITY): Payer: Self-pay

## 2023-01-16 NOTE — Progress Notes (Signed)
Internal Medicine Clinic Attending  I was physically present during the key portions of the resident provided service and participated in the medical decision making of patient's management care. I reviewed pertinent patient test results.  The assessment, diagnosis, and plan were formulated together and I agree with the documentation in the resident's note.  On my exam there is reduced ROM with internal and external rotation.  Pain limits lateral elevation and is reproduced by cross arm test.  No bicipital tendon tenderness.  Grips strong and symmetric.  L knee with "clunk" and pain during extension of knee.  Bakers cyst palpable behind L knee.  NSAIDs recommended. Primary problem is her unstable social/housing situation and the significant stress she is experiencing.  The process of moving has exacerbated her joint issues.    Miguel Aschoff, MD

## 2023-01-21 DIAGNOSIS — Z419 Encounter for procedure for purposes other than remedying health state, unspecified: Secondary | ICD-10-CM | POA: Diagnosis not present

## 2023-02-03 ENCOUNTER — Ambulatory Visit: Payer: Medicaid Other

## 2023-02-21 DIAGNOSIS — Z419 Encounter for procedure for purposes other than remedying health state, unspecified: Secondary | ICD-10-CM | POA: Diagnosis not present

## 2023-03-23 DIAGNOSIS — Z419 Encounter for procedure for purposes other than remedying health state, unspecified: Secondary | ICD-10-CM | POA: Diagnosis not present

## 2023-04-22 ENCOUNTER — Ambulatory Visit (INDEPENDENT_AMBULATORY_CARE_PROVIDER_SITE_OTHER): Payer: Medicaid Other | Admitting: Licensed Clinical Social Worker

## 2023-04-22 ENCOUNTER — Telehealth: Payer: Self-pay | Admitting: *Deleted

## 2023-04-22 DIAGNOSIS — Z59811 Housing instability, housed, with risk of homelessness: Secondary | ICD-10-CM | POA: Diagnosis not present

## 2023-04-22 DIAGNOSIS — F321 Major depressive disorder, single episode, moderate: Secondary | ICD-10-CM

## 2023-04-22 NOTE — Telephone Encounter (Signed)
Call from ptient feeling a little down this morning. Unable to get a job and is currently living with her daughter and grandson.  Patient was crying at the start of the conversation, but after talking seemed a little more hopeful.  Currently is looking for employment, but has problems with her knees.  When asked stated that she would not hurt herself. Was given an appointment to speak with Marena Chancy, [facility] Counselor for the Clinics this morning.  Is awaiting her call and will call back if she needs anything else.

## 2023-04-22 NOTE — BH Specialist Note (Addendum)
Integrated Behavioral Health via Telemedicine Visit  04/22/2023 Omesha Deshotel [id number]  Number of Integrated Behavioral Health Clinician visits:  Initial Visit Session Start time: [id number]   Session End time: 918 Total time in minutes: 31  Referring Provider: PCP Patient/Family location: Home [facility] Provider location: Office All persons participating in visit: [facility] and Patient   Types of Service: Individual psychotherapy and Introduction only  I connected with Delbert Phenix  via  Telephone and verified that I am speaking with the correct person using two identifiers. Discussed confidentiality: Yes   I discussed the limitations of telemedicine and the availability of in person appointments.  Discussed there is a possibility of technology failure and discussed alternative modes of communication if that failure occurs.  I discussed that engaging in this telemedicine visit, they consent to the provision of behavioral healthcare and the services will be billed under their insurance.  Patient and/or legal guardian expressed understanding and consented to Telemedicine visit: Yes   Presenting Concerns: Patient and/or family reports the following symptoms/concerns: On 10/31, LCSW-A, [facility] conducted a telehealth telephone session with the patient per patients request, a 57 year old African American unemployed female with managed Medicaid. After introducing herself and obtaining the patient's consent for telehealth services, the [facility] noted that the patient appeared sad and was crying during the session. The patient reported significant life stressors, including the imminent auction of her belongings from storage due to her inability to pay the $100 fee. She disclosed a history of housing instability, having been evicted from [place] in May 2024 and subsequently living with a friend in [place] from 10/2022 to 03/2023. When asked to leave by her friend, the patient briefly stayed in a shelter, which she  described as an unfit and horrible experience. With the assistance of a police officer, she returned to her home area. Currently, the patient is actively job searching and has a pending disability application. The [facility] and patient discussed self-care strategies and coping skills to address symptoms of depression. Throughout the session, the [facility] provided supportive listening and guidance to help the patient navigate her current challenges.  Duration of problem: More than 2 yeras; Severity of problem: moderate  Patient and/or Family's Strengths/Protective Factors: Social connections  Goals Addressed: Patient will:  Reduce symptoms of: depression   Increase knowledge and/or ability of: coping skills   Demonstrate ability to: Increase healthy adjustment to current life circumstances  Progress towards Goals: Ongoing  Interventions: Interventions utilized:  CBT Cognitive Behavioral Therapy Standardized Assessments completed: PHQ-SADS [facility] will update on 11/04    01/13/2023    1:32 PM 01/13/2023    1:31 PM 11/23/2022   10:40 AM  PHQ-SADS Last 3 Score only  Total GAD-7 Score 3    PHQ Adolescent Score  4 1    Patient and/or Family Response: Patient agreed to services  Assessment: Patient currently experiencing Symptoms of depression due to housing situation..   Patient may benefit from Individual therapy.  Plan: Follow up with behavioral health clinician on : 11/04 at 1:30 pm    I discussed the assessment and treatment plan with the patient and/or parent/guardian. They were provided an opportunity to ask questions and all were answered. They agreed with the plan and demonstrated an understanding of the instructions.   They were advised to call back or seek an in-person evaluation if the symptoms worsen or if the condition fails to improve as anticipated.  Christen Butter, MSW, LCSW-A She/Her Behavioral Health Clinician [facility]  Internal Medicine Center Direct Dial:[phone]  Fax  [phone] Main Office Phone: [phone] [address] [place] [place], [place] [address] Website: [facility] Internal Medicine [facility]  [place], [place]  [facility]

## 2023-04-23 DIAGNOSIS — Z419 Encounter for procedure for purposes other than remedying health state, unspecified: Secondary | ICD-10-CM | POA: Diagnosis not present

## 2023-04-27 ENCOUNTER — Ambulatory Visit (INDEPENDENT_AMBULATORY_CARE_PROVIDER_SITE_OTHER): Payer: Medicaid Other | Admitting: Licensed Clinical Social Worker

## 2023-04-27 DIAGNOSIS — Z59811 Housing instability, housed, with risk of homelessness: Secondary | ICD-10-CM

## 2023-04-27 DIAGNOSIS — R452 Unhappiness: Secondary | ICD-10-CM | POA: Diagnosis not present

## 2023-04-27 NOTE — BH Specialist Note (Signed)
Integrated Behavioral Health via Telemedicine Visit  04/27/2023 Tammie Solis [id number]  Number of Integrated Behavioral Health Clinician visits: Additional Visit  Session Start time: 1330   Session End time: 1430  Total time in minutes: 60   Referring Provider: PCP Patient/Family location: Home [facility] Provider location: Office All persons participating in visit: [facility] and Patient Types of Service: Individual psychotherapy  I connected with Tammie Solis  via  Telephone and verified that I am speaking with the correct person using two identifiers. Discussed confidentiality: Yes   I discussed the limitations of telemedicine and the availability of in person appointments.  Discussed there is a possibility of technology failure and discussed alternative modes of communication if that failure occurs.  I discussed that engaging in this telemedicine visit, they consent to the provision of behavioral healthcare and the services will be billed under their insurance.  Patient and/or legal guardian expressed understanding and consented to Telemedicine visit: Yes   Presenting Concerns: Patient and/or family reports the following symptoms/concerns:   The patient is currently residing with her daughter and has made arrangements regarding her storage unit to retrieve her belongings. She expressed frustration about her ongoing job search, but clarified that she is not experiencing feelings of depression or extreme anxiety, only frustration related to her employment situation. The patient has scheduled a doctor's appointment and reported feeling "really good" overall. This positive outlook suggests that while she is dealing with the stress of job hunting, her mental health remains stable. Continued support and encouragement in her job search may be beneficial, along with monitoring her emotional well-being during this transitional period.    Patient and/or Family's Strengths/Protective Factors: Social  connections  Goals Addressed: Patient will:  Reduce symptoms of:  Housing    Progress towards Goals: Ongoing  Interventions: Interventions utilized:  Link to [employer] Standardized Assessments completed: Not Needed  Patient and/or Family Response: Patient agreed to ongoing services  Assessment: Patient currently experiencing Housing and employment difficulties..   Patient may benefit from Housing and employment resources.  Plan: Follow up with behavioral health clinician on : Patient will contact office when services is needed.   I discussed the assessment and treatment plan with the patient and/or parent/guardian. They were provided an opportunity to ask questions and all were answered. They agreed with the plan and demonstrated an understanding of the instructions.   They were advised to call back or seek an in-person evaluation if the symptoms worsen or if the condition fails to improve as anticipated.  Christen Butter, MSW, LCSW-A She/Her Behavioral Health Clinician [facility]  Internal Medicine Center Direct Dial:[phone]  Fax [phone] Main Office Phone: [phone] [address] [place] [place], [place] [address] Website: [facility] Internal Medicine [facility]  [place], [place]  [place]

## 2023-05-05 DIAGNOSIS — Z23 Encounter for immunization: Secondary | ICD-10-CM | POA: Diagnosis not present

## 2023-05-23 DIAGNOSIS — Z419 Encounter for procedure for purposes other than remedying health state, unspecified: Secondary | ICD-10-CM | POA: Diagnosis not present

## 2023-05-25 IMAGING — CR DG KNEE COMPLETE 4+V*L*
4 series · 4 of 4 positions shown · non-contrast
Comparison: None.

CLINICAL DATA: Severe chronic left knee pain

EXAM:
LEFT KNEE - COMPLETE 4+ VIEW

[knee ap]
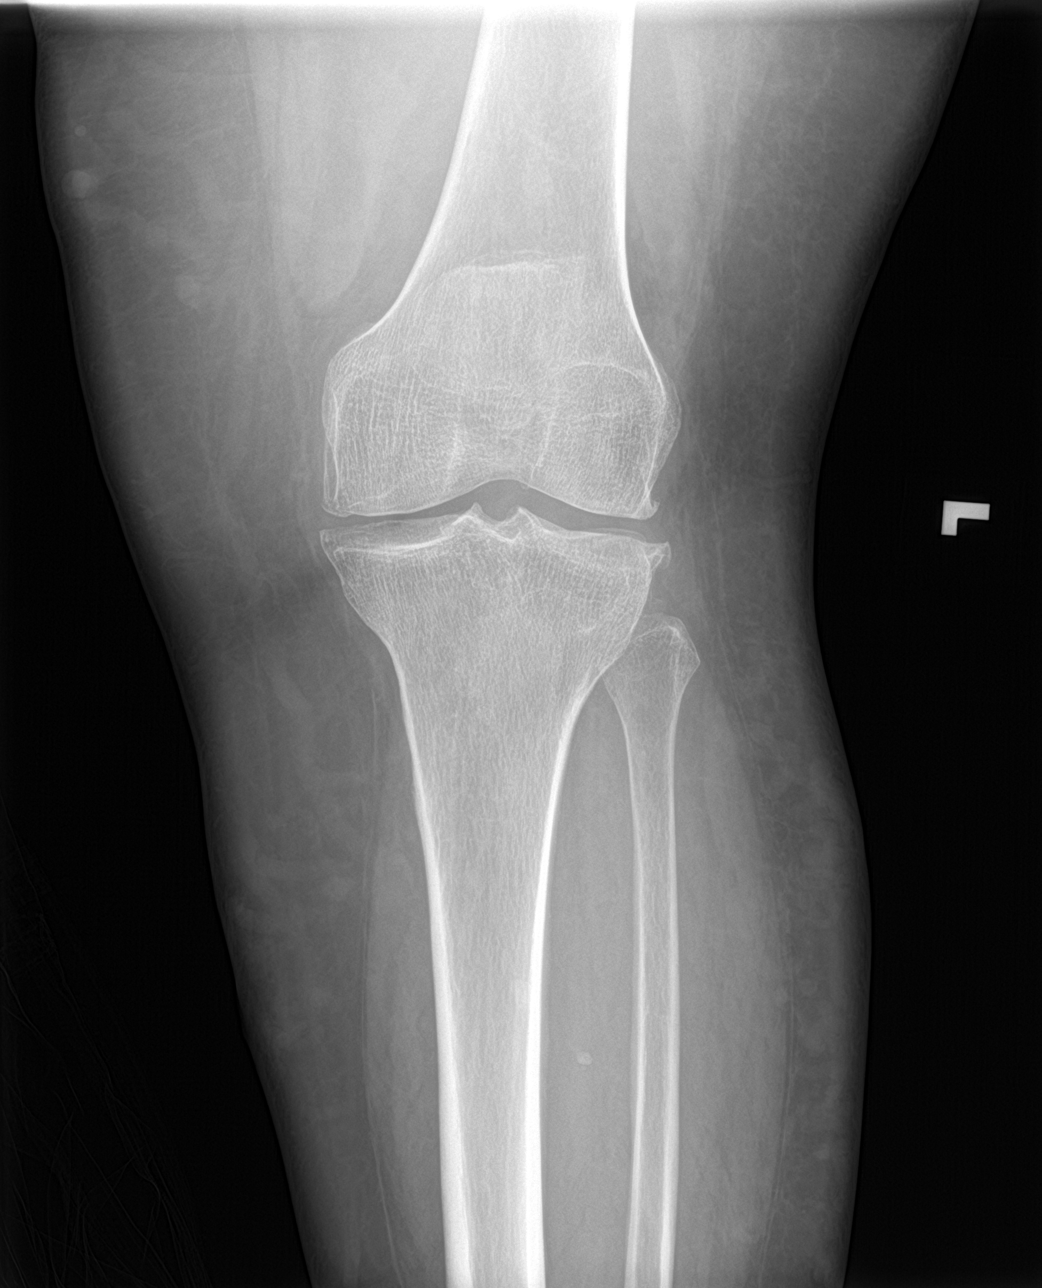

[knee lat]
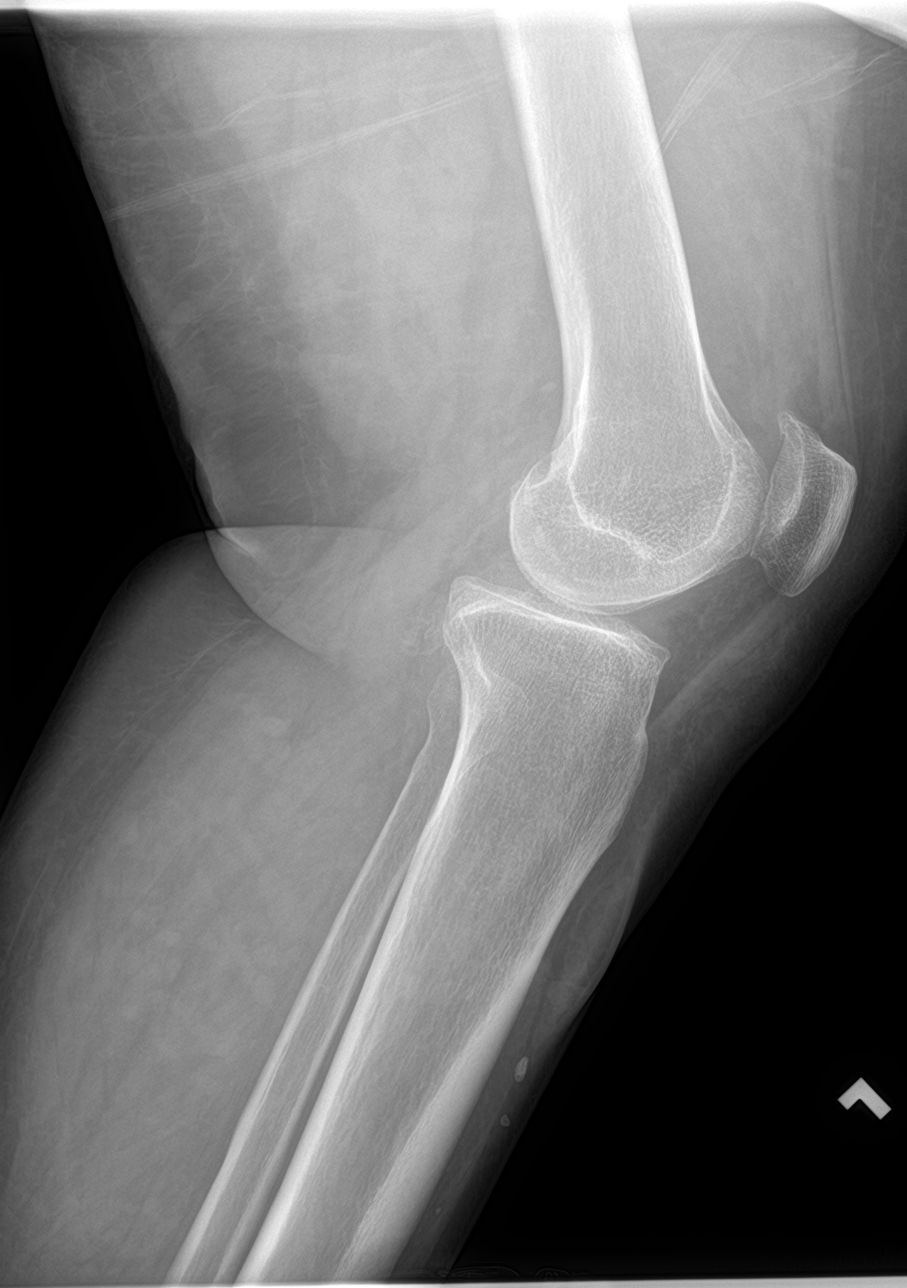

[knee obl (1 of 2)]
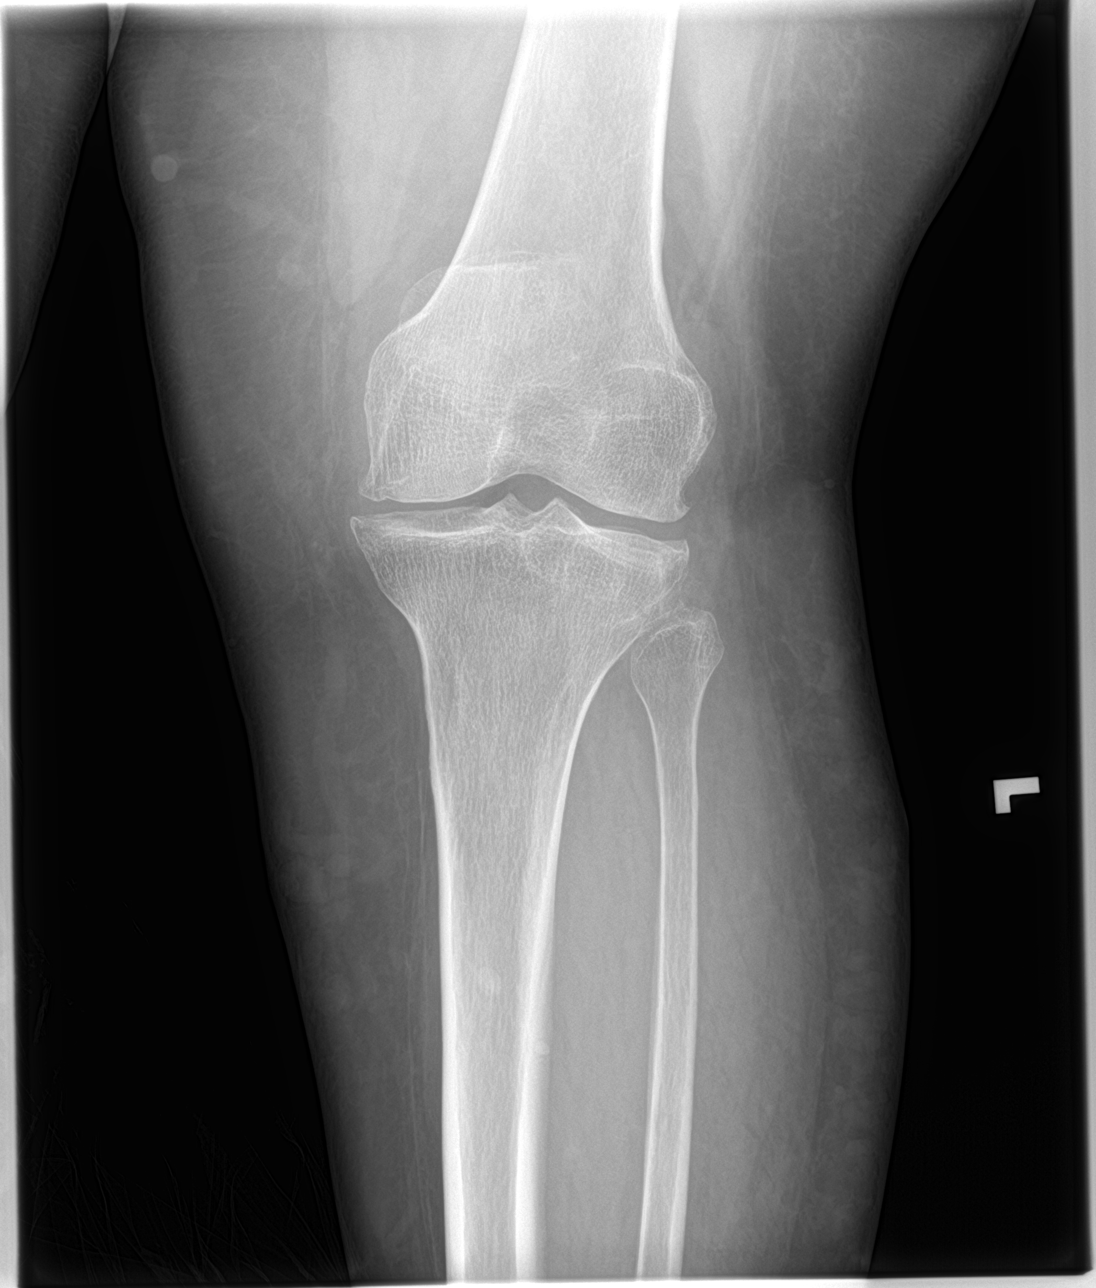

[knee obl (2 of 2)]
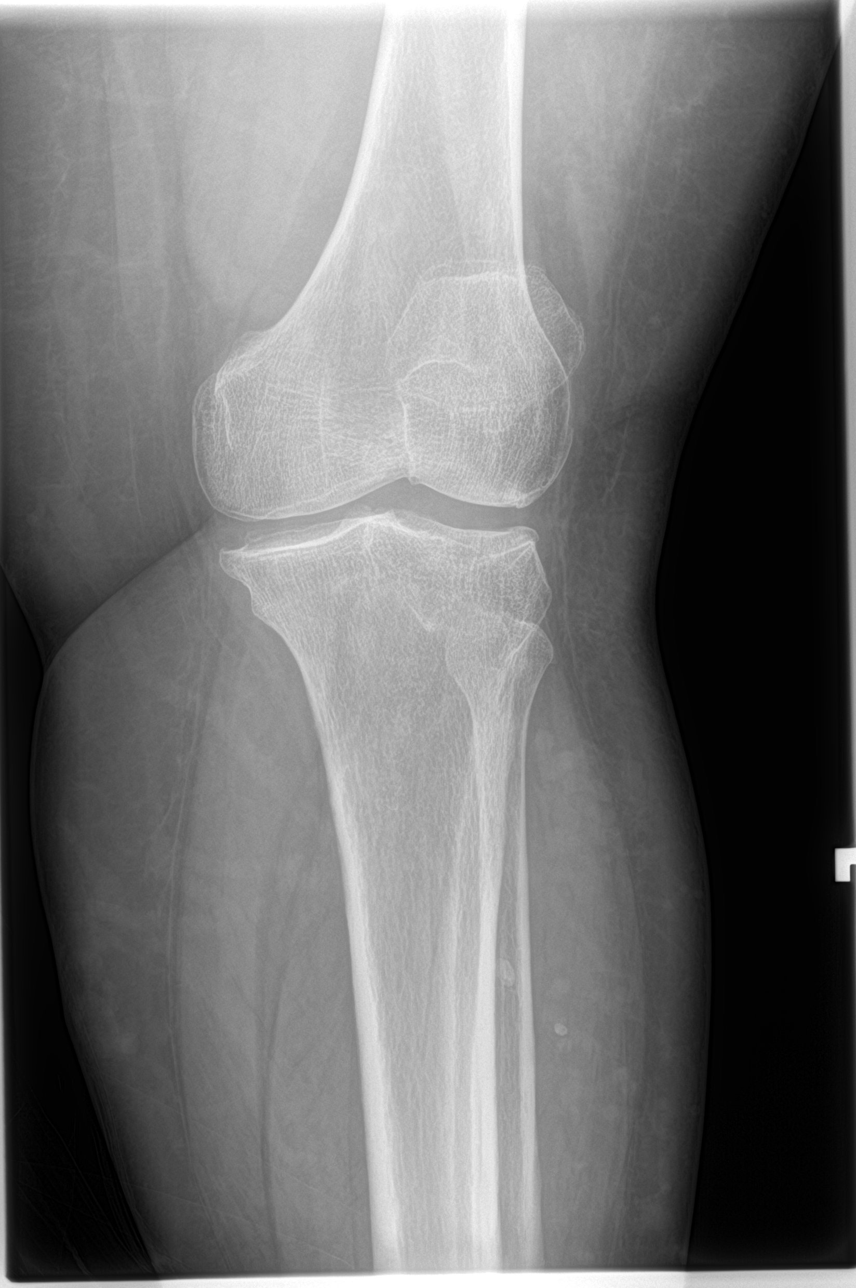

[4 of 4 positions shown; findings below may reference images not displayed]

FINDINGS: Tricompartment degenerative changes with joint space narrowing and
spurring. No acute bony abnormality. Specifically, no fracture,
subluxation, or dislocation. No joint effusion.
IMPRESSION: Mild tricompartment degenerative changes. No acute bony abnormality.

## 2023-06-21 ENCOUNTER — Ambulatory Visit (INDEPENDENT_AMBULATORY_CARE_PROVIDER_SITE_OTHER): Payer: Medicaid Other | Admitting: Internal Medicine

## 2023-06-21 ENCOUNTER — Encounter: Payer: Self-pay | Admitting: Internal Medicine

## 2023-06-21 ENCOUNTER — Other Ambulatory Visit (HOSPITAL_COMMUNITY)
Admission: RE | Admit: 2023-06-21 | Discharge: 2023-06-21 | Disposition: A | Discharge status: Home / Self Care | Payer: Medicaid Other | Source: Ambulatory Visit | Attending: Internal Medicine | Admitting: Internal Medicine

## 2023-06-21 VITALS — BP 135/60 | HR 90 | Temp 98.9°F | Ht 62.0 in | Wt 270.2 lb

## 2023-06-21 DIAGNOSIS — Z124 Encounter for screening for malignant neoplasm of cervix: Secondary | ICD-10-CM

## 2023-06-21 DIAGNOSIS — N939 Abnormal uterine and vaginal bleeding, unspecified: Secondary | ICD-10-CM

## 2023-06-21 DIAGNOSIS — E7849 Other hyperlipidemia: Secondary | ICD-10-CM

## 2023-06-21 DIAGNOSIS — R7303 Prediabetes: Secondary | ICD-10-CM | POA: Diagnosis not present

## 2023-06-21 DIAGNOSIS — E7841 Elevated Lipoprotein(a): Secondary | ICD-10-CM | POA: Diagnosis not present

## 2023-06-21 DIAGNOSIS — A5901 Trichomonal vulvovaginitis: Secondary | ICD-10-CM

## 2023-06-21 DIAGNOSIS — Z59811 Housing instability, housed, with risk of homelessness: Secondary | ICD-10-CM | POA: Diagnosis not present

## 2023-06-21 DIAGNOSIS — Z5986 Financial insecurity: Secondary | ICD-10-CM

## 2023-06-21 NOTE — Assessment & Plan Note (Signed)
She has not taken Crestor in some time.  This medication was not too expensive for her but she just felt overwhelmed and stopped the medication.  Will repeat lipid panel as last was in 2022.  She would be open to restarting statin. -Repeat lipid panel, plan to restart statin dose depending on LDL level.  Goal is for primary prevention

## 2023-06-21 NOTE — Assessment & Plan Note (Signed)
A1c elevated at 6.1 in December 2023.  We will repeat hemoglobin A1c today.  She has been prescribed Ozempic in the past but I do worry about the cost of this for her.  Will hold off on represcribing this for now. -A1c

## 2023-06-21 NOTE — Progress Notes (Signed)
Subjective:  CC: Cervical cancer screening  HPI:  Tammie Solis is a 57 y.o. female with a past medical history of hypertension, prediabetes, obesity who presents for cervical cancer screening.  Please see problem based assessment and plan for additional details.  Past Medical History:  Diagnosis Date   Allergy    Anemia    Arthritis    knees    Asthma    GERD (gastroesophageal reflux disease)    occasionally    Neuromuscular disorder (HCC)    sciatica left leg   Obesity    Prediabetes     Current Outpatient Medications on File Prior to Visit  Medication Sig Dispense Refill   acetaminophen (TYLENOL) 650 MG CR tablet Take 1 tablet (650 mg total) by mouth every 8 (eight) hours as needed for pain. 60 tablet 0   albuterol (VENTOLIN HFA) 108 (90 Base) MCG/ACT inhaler Inhale 2 puffs into the lungs every 4 (four) hours as needed for wheezing or shortness of breath. 1 each 0   budesonide-formoterol (SYMBICORT) 80-4.5 MCG/ACT inhaler Inhale 2 puffs into the lungs 2 (two) times daily. 10.2 g 12   diclofenac Sodium (VOLTAREN ARTHRITIS PAIN) 1 % GEL Apply 2 g topically 4 (four) times daily. Apply as needed. 100 g 3   ibuprofen (ADVIL) 800 MG tablet Take 1 tablet (800 mg total) by mouth every 8 (eight) hours as needed. 10 tablet 0   Loratadine 10 MG CAPS Take by mouth.     Multiple Vitamins-Minerals (CENTRUM SILVER 50+WOMEN PO) Take 1 each by mouth daily.     rosuvastatin (CRESTOR) 20 MG tablet Take 1 tablet (20 mg total) by mouth daily. 30 tablet 11   No current facility-administered medications on file prior to visit.    Family History  Problem Relation Age of Onset   Diabetes Mother    Kidney failure Mother    Cancer Father        prostate   Prostate cancer Father    Colon cancer Neg Hx    Colon polyps Neg Hx    Esophageal cancer Neg Hx    Rectal cancer Neg Hx    Stomach cancer Neg Hx    Pancreatic cancer Neg Hx     Social History   Socioeconomic History   Marital  status: Single    Spouse name: Not on file   Number of children: Not on file   Years of education: Not on file   Highest education level: Not on file  Occupational History   Not on file  Tobacco Use   Smoking status: Never   Smokeless tobacco: Never  Substance and Sexual Activity   Alcohol use: No   Drug use: No   Sexual activity: Never    Birth control/protection: None  Other Topics Concern   Not on file  Social History Narrative   Not on file   Social Drivers of Health   Financial Resource Strain: High Risk (09/29/2021)   Overall Financial Resource Strain (CARDIA)    Difficulty of Paying Living Expenses: Hard  Food Insecurity: No Food Insecurity (09/10/2022)   Hunger Vital Sign    Worried About Running Out of Food in the Last Year: Never true    Ran Out of Food in the Last Year: Never true  Transportation Needs: No Transportation Needs (09/10/2022)   PRAPARE - Administrator, Civil Service (Medical): No    Lack of Transportation (Non-Medical): No  Physical Activity: Not on file  Stress:  Not on file  Social Connections: Moderately Isolated (06/03/2022)   Social Connection and Isolation Panel [NHANES]    Frequency of Communication with Friends and Family: More than three times a week    Frequency of Social Gatherings with Friends and Family: More than three times a week    Attends Religious Services: Never    Database administrator or Organizations: Yes    Attends Banker Meetings: Never    Marital Status: Never married  Intimate Partner Violence: Not At Risk (09/10/2022)   Humiliation, Afraid, Rape, and Kick questionnaire    Fear of Current or Ex-Partner: No    Emotionally Abused: No    Physically Abused: No    Sexually Abused: No    Review of Systems: ROS negative except for what is noted on the assessment and plan.  Objective:   Vitals:   06/21/23 1529 06/21/23 1601  BP: (!) 146/66 135/60  Pulse: 94 90  Temp: 98.9 F (37.2 C)    TempSrc: Oral   SpO2: 99%   Weight: 270 lb 3.2 oz (122.6 kg)   Height: 5\' 2"  (1.575 m)     Physical Exam: Constitutional: well-appearing, in no acute distress Cardiovascular: regular rate and rhythm, no m/r/g Pulmonary/Chest: normal work of breathing on room air, lungs clear to auscultation bilaterally ZO:[id number] appearing external gentilia, normal appearing vaginal mucosa and cervix.  Blood noted in vaginal vault.  Pap smear and wet prep obtained.  Female nursing staff present during patient's pelvic examination  Assessment & Plan:  Abnormal uterine bleeding Patient reports that she had regular periods but they stopped for more than 12 months completely.  In February 2024 she started having genital bleeding again.  Bleeding was irregular and happened for 1 to 2 days multiple times weekly. Plan: Presenting with abnormal uterine bleeding.  It sounds like she went through menopause and presents with postmenopausal bleeding.  I am referring her to gynecology and talked with her about need for additional testing including possible endometrial biopsy.  Cervical cancer screening Last Pap smear was in 2016.  I do not have cytology records but HPV was normal at that time.  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  She is not sexually active but would like to have STD testing.  She did have HIV test that was negative in 2021 and has not been sexually active since then.  Denies vaginal discharge. P: Some difficulty with visualizing cervix with bleeding.  I have sent testing for cytology and high-risk HPV.  If no cervical cells sampled she can repeat this with gynecology. Mellody Drown Prep  Prediabetes A1c elevated at 6.1 in December 2023.  We will repeat hemoglobin A1c today.  She has been prescribed Ozempic in the past but I do worry about the cost of this for her.  Will hold off on represcribing this for now. -A1c  Hyperlipidemia She has not taken Crestor in some time.  This  medication was not too expensive for her but she just felt overwhelmed and stopped the medication.  Will repeat lipid panel as last was in 2022.  She would be open to restarting statin. -Repeat lipid panel, plan to restart statin dose depending on LDL level.  Goal is for primary prevention  Financial insecurity Ms. Muldowney lost her housing a few weeks ago and would like to speak with Colleen Can about resources. -Referral to [facility] placed   Patient discussed with Dr. Hurshel Keys Crall Freiermuth, D.O. [place]  Internal Medicine  PGY-3 Pager: [phone]  Phone: [phone] Date 06/21/2023  Time 6:26 PM

## 2023-06-21 NOTE — Assessment & Plan Note (Signed)
Last Pap smear was in 2016.  I do not have cytology records but HPV was normal at that time.  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  She is not sexually active but would like to have STD testing.  She did have HIV test that was negative in 2021 and has not been sexually active since then.  Denies vaginal discharge. P: Some difficulty with visualizing cervix with bleeding.  I have sent testing for cytology and high-risk HPV.  If no cervical cells sampled she can repeat this with gynecology. -Mellody Drown Prep

## 2023-06-21 NOTE — Assessment & Plan Note (Signed)
Patient reports that she had regular periods but they stopped for more than 12 months completely.  In February 2024 she started having genital bleeding again.  Bleeding was irregular and happened for 1 to 2 days multiple times weekly. Plan: Presenting with abnormal uterine bleeding.  It sounds like she went through menopause and presents with postmenopausal bleeding.  I am referring her to gynecology and talked with her about need for additional testing including possible endometrial biopsy.

## 2023-06-21 NOTE — Assessment & Plan Note (Signed)
Tammie Solis lost her housing a few weeks ago and would like to speak with Colleen Can about resources. -Referral to [place] placed

## 2023-06-21 NOTE — Patient Instructions (Addendum)
Thank you, Ms.Greenley Beucler for allowing [name] to provide your care today.   History of pre diabetes I will call with results of hemoglobin A1c.  Cholesterol Will call with results of cholesterol labs  Pap smear I will call with Pap smear results  Please schedule your mammogram.  Blood pressure looked better when we rechecked.  I have ordered the following labs for you:  Lab Orders         Hemoglobin A1c         Lipid Profile       I have ordered the following medication/changed the following medications:   Stop the following medications: Medications Discontinued During This Encounter  Medication Reason   Probiotic Product (RA PROBIOTIC GUMMIES PO)    lisinopril-hydrochlorothiazide (ZESTORETIC) 20-12.5 MG tablet    lidocaine (PF) (XYLOCAINE) 1 % injection 2 mL      Start the following medications: No orders of the defined types were placed in this encounter.    Follow up: 3 months   We look forward to seeing you next time. Please call our clinic at [phone] if you have any questions or concerns. The best time to call is Monday-Friday from 9am-4pm, but there is someone available 24/7. If after hours or the weekend, call the main hospital number and ask for the Internal Medicine Resident On-Call. If you need medication refills, please notify your pharmacy one week in advance and they will send [name] a request.   Thank you for trusting me with your care. Wishing you the best!   Rudene Christians, DO [facility] Internal Medicine Center

## 2023-06-22 LAB — LIPID PANEL
Chol/HDL Ratio: 5 {ratio} — ABNORMAL HIGH (ref 0.0–4.4)
Cholesterol, Total: 200 mg/dL — ABNORMAL HIGH (ref 100–199)
HDL: 40 mg/dL (ref >39)
LDL Chol Calc (NIH): 138 mg/dL — ABNORMAL HIGH (ref 0–99)
Triglycerides: 119 mg/dL (ref 0–149)
VLDL Cholesterol Cal: 22 mg/dL (ref 5–40)

## 2023-06-22 LAB — CERVICOVAGINAL ANCILLARY ONLY
Chlamydia: NEGATIVE
Comment: NEGATIVE
Comment: NEGATIVE
Comment: NORMAL
Neisseria Gonorrhea: NEGATIVE
Trichomonas: POSITIVE — AB

## 2023-06-22 LAB — HEMOGLOBIN A1C
Est. average glucose Bld gHb Est-mCnc: 114 mg/dL
Hgb A1c MFr Bld: 5.6 % (ref 4.8–5.6)

## 2023-06-23 DIAGNOSIS — Z419 Encounter for procedure for purposes other than remedying health state, unspecified: Secondary | ICD-10-CM | POA: Diagnosis not present

## 2023-06-23 MED ORDER — METRONIDAZOLE 500 MG PO TABS
500.0000 mg | ORAL_TABLET | Freq: Two times a day (BID) | ORAL | 0 refills | Status: AC
  Filled 2023-06-23: qty 14, 7d supply, fill #0

## 2023-06-23 MED ORDER — ROSUVASTATIN CALCIUM 20 MG PO TABS
20.0000 mg | ORAL_TABLET | Freq: Every day | ORAL | 11 refills | Status: DC
  Filled 2023-06-23: qty 30, 30d supply, fill #0
  Filled 2023-08-23: qty 30, 30d supply, fill #1
  Filled 2023-09-30: qty 30, 30d supply, fill #2

## 2023-06-24 ENCOUNTER — Other Ambulatory Visit (HOSPITAL_COMMUNITY): Payer: Self-pay

## 2023-06-24 LAB — CYTOLOGY - PAP
Comment: NEGATIVE
Diagnosis: NEGATIVE
High risk HPV: NEGATIVE

## 2023-06-28 ENCOUNTER — Other Ambulatory Visit: Payer: Self-pay | Admitting: Internal Medicine

## 2023-06-28 DIAGNOSIS — Z5986 Financial insecurity: Secondary | ICD-10-CM

## 2023-06-28 NOTE — Progress Notes (Signed)
 Referral made to [facility].

## 2023-06-29 ENCOUNTER — Other Ambulatory Visit: Payer: Self-pay | Admitting: Internal Medicine

## 2023-06-29 DIAGNOSIS — Z5986 Financial insecurity: Secondary | ICD-10-CM

## 2023-07-09 ENCOUNTER — Ambulatory Visit
Admission: RE | Admit: 2023-07-09 | Discharge: 2023-07-09 | Disposition: A | Discharge status: Home / Self Care | Payer: Medicaid Other | Source: Ambulatory Visit | Attending: Internal Medicine | Admitting: Internal Medicine

## 2023-07-09 DIAGNOSIS — Z1231 Encounter for screening mammogram for malignant neoplasm of breast: Secondary | ICD-10-CM | POA: Diagnosis not present

## 2023-07-09 DIAGNOSIS — Z7689 Persons encountering health services in other specified circumstances: Secondary | ICD-10-CM | POA: Diagnosis not present

## 2023-07-20 ENCOUNTER — Ambulatory Visit: Payer: Medicaid Other | Admitting: Licensed Clinical Social Worker

## 2023-07-20 DIAGNOSIS — Z1331 Encounter for screening for depression: Secondary | ICD-10-CM | POA: Diagnosis not present

## 2023-07-20 DIAGNOSIS — Z59811 Housing instability, housed, with risk of homelessness: Secondary | ICD-10-CM | POA: Diagnosis not present

## 2023-07-20 NOTE — BH Specialist Note (Addendum)
Integrated Behavioral Health via Telemedicine Visit  07/20/2023 Tammie Solis [id number]  Number of Integrated Behavioral Health Clinician visits: 1- Initial Visit  Session Start time: 0900   Session End time: 0930  Total time in minutes: 30   Referring Provider: Rudene Christians, Do Patient/Family location: Home [facility] Provider location: Office All persons participating in visit: [facility] and Patient  Types of Service: Introduction only  I connected with Delbert Phenix Telephone and verified that I am speaking with the correct person using two identifiers. Discussed confidentiality: Yes   I discussed the limitations of telemedicine and the availability of in person appointments.  Discussed there is a possibility of technology failure and discussed alternative modes of communication if that failure occurs.  I discussed that engaging in this telemedicine visit, they consent to the provision of behavioral healthcare and the services will be billed under their insurance.  Patient and/or legal guardian expressed understanding and consented to Telemedicine visit: Yes   Presenting Concerns: Patient and/or family reports the following symptoms/concerns: The Licensed Clinical Engineer, building services (LCSW-A), acting as a Visual merchandiser [facility]), initiated a session with patient. The [facility] introduced themselves, explained her role, and provided contact information to the patient. Confidentiality and mandated reporting were discussed, and the patient denied any suicidal ideations or intent to harm others. The Integrated Behavioral Health (IBH) approach was reviewed, and a PHQ-9 assessment was completed.    Standardized Assessments completed: PHQ-SADS     06/21/2023    3:57 PM 01/13/2023    1:32 PM 01/13/2023    1:31 PM  PHQ-SADS Last 3 Score only  Total GAD-7 Score 2 3   PHQ Adolescent Score 3  4     Patient and/or Family Response: Patient agrees to services  Assessment: Patient  currently experiencing Depression and Anxiety.   Patient may benefit from OPT.  Plan: Follow up with behavioral health clinician on : 02/18 at 2:30 Pm via telehealth   I discussed the assessment and treatment plan with the patient and/or parent/guardian. They were provided an opportunity to ask questions and all were answered. They agreed with the plan and demonstrated an understanding of the instructions.   They were advised to call back or seek an in-person evaluation if the symptoms worsen or if the condition fails to improve as anticipated.  Christen Butter, MSW, LCSW-A She/Her Behavioral Health Clinician [facility]  Internal Medicine Center Direct [phone]  Fax [phone] Main Office Phone: [phone] [address] [place] [place], [place] [address] Website: [facility] Internal Medicine [facility]  [place], [place]  [place]

## 2023-07-24 DIAGNOSIS — Z419 Encounter for procedure for purposes other than remedying health state, unspecified: Secondary | ICD-10-CM | POA: Diagnosis not present

## 2023-07-26 ENCOUNTER — Other Ambulatory Visit (HOSPITAL_COMMUNITY)
Admission: RE | Admit: 2023-07-26 | Discharge: 2023-07-26 | Disposition: A | Discharge status: Home / Self Care | Payer: Medicaid Other | Source: Ambulatory Visit | Attending: Internal Medicine | Admitting: Internal Medicine

## 2023-07-26 ENCOUNTER — Ambulatory Visit: Payer: Medicaid Other

## 2023-07-26 DIAGNOSIS — A5901 Trichomonal vulvovaginitis: Secondary | ICD-10-CM | POA: Insufficient documentation

## 2023-07-26 DIAGNOSIS — Z7689 Persons encountering health services in other specified circumstances: Secondary | ICD-10-CM | POA: Diagnosis not present

## 2023-07-27 LAB — CERVICOVAGINAL ANCILLARY ONLY
Chlamydia: NEGATIVE
Comment: NEGATIVE
Comment: NEGATIVE
Comment: NORMAL
Neisseria Gonorrhea: NEGATIVE
Trichomonas: NEGATIVE

## 2023-07-27 NOTE — Progress Notes (Addendum)
07/26/2023 Patient was given kit to do self swab as ordered. Instructions given on how to obtain a specimen.

## 2023-08-04 ENCOUNTER — Encounter: Payer: Medicaid Other | Admitting: Student

## 2023-08-06 ENCOUNTER — Ambulatory Visit: Payer: Medicaid Other | Admitting: Student

## 2023-08-06 ENCOUNTER — Other Ambulatory Visit (HOSPITAL_COMMUNITY): Payer: Self-pay

## 2023-08-06 VITALS — BP 166/83 | HR 85 | Temp 98.2°F | Ht 62.0 in | Wt 269.8 lb

## 2023-08-06 DIAGNOSIS — M1712 Unilateral primary osteoarthritis, left knee: Secondary | ICD-10-CM | POA: Diagnosis not present

## 2023-08-06 DIAGNOSIS — Z6841 Body Mass Index (BMI) 40.0 and over, adult: Secondary | ICD-10-CM

## 2023-08-06 DIAGNOSIS — I10 Essential (primary) hypertension: Secondary | ICD-10-CM | POA: Diagnosis not present

## 2023-08-06 DIAGNOSIS — M1612 Unilateral primary osteoarthritis, left hip: Secondary | ICD-10-CM

## 2023-08-06 MED ORDER — WEGOVY 0.25 MG/0.5ML ~~LOC~~ SOAJ
0.2500 mg | SUBCUTANEOUS | 3 refills | Status: DC
  Filled 2023-08-06 – 2023-08-23 (×2): qty 2, 28d supply, fill #0
  Filled 2023-09-16 – 2023-09-30 (×2): qty 2, 28d supply, fill #1

## 2023-08-06 NOTE — Patient Instructions (Signed)
I have sent in a referral to physical therapy to help with your leg pain.  I have also sent in a prescription for Wegovy to help with weight loss. I am hoping this will be covered by your insurance.  Follow-up in 3 months.

## 2023-08-06 NOTE — Progress Notes (Signed)
CC: Follow-up  HPI:  [name] is a 58 y.o. female living with a history stated below and presents today for follow-up. Please see problem based assessment and plan for additional details.  Past Medical History:  Diagnosis Date   Allergy    Anemia    Arthritis    knees    Asthma    GERD (gastroesophageal reflux disease)    occasionally    Neuromuscular disorder (HCC)    sciatica left leg   Obesity    Prediabetes     Current Outpatient Medications on File Prior to Visit  Medication Sig Dispense Refill   acetaminophen (TYLENOL) 650 MG CR tablet Take 1 tablet (650 mg total) by mouth every 8 (eight) hours as needed for pain. 60 tablet 0   albuterol (VENTOLIN HFA) 108 (90 Base) MCG/ACT inhaler Inhale 2 puffs into the lungs every 4 (four) hours as needed for wheezing or shortness of breath. 1 each 0   budesonide-formoterol (SYMBICORT) 80-4.5 MCG/ACT inhaler Inhale 2 puffs into the lungs 2 (two) times daily. 10.2 g 12   diclofenac Sodium (VOLTAREN ARTHRITIS PAIN) 1 % GEL Apply 2 g topically 4 (four) times daily. Apply as needed. 100 g 3   ibuprofen (ADVIL) 800 MG tablet Take 1 tablet (800 mg total) by mouth every 8 (eight) hours as needed. 10 tablet 0   Loratadine 10 MG CAPS Take by mouth.     Multiple Vitamins-Minerals (CENTRUM SILVER 50+WOMEN PO) Take 1 each by mouth daily.     rosuvastatin (CRESTOR) 20 MG tablet Take 1 tablet (20 mg total) by mouth daily. 30 tablet 11   No current facility-administered medications on file prior to visit.    Family History  Problem Relation Age of Onset   Diabetes Mother    Kidney failure Mother    Cancer Father        prostate   Prostate cancer Father    Colon cancer Neg Hx    Colon polyps Neg Hx    Esophageal cancer Neg Hx    Rectal cancer Neg Hx    Stomach cancer Neg Hx    Pancreatic cancer Neg Hx     Social History   Socioeconomic History   Marital status: Single    Spouse name: Not on file   Number of children: Not on  file   Years of education: Not on file   Highest education level: Not on file  Occupational History   Not on file  Tobacco Use   Smoking status: Never   Smokeless tobacco: Never  Substance and Sexual Activity   Alcohol use: No   Drug use: No   Sexual activity: Never    Birth control/protection: None  Other Topics Concern   Not on file  Social History Narrative   Not on file   Social Drivers of Health   Financial Resource Strain: High Risk (09/29/2021)   Overall Financial Resource Strain (CARDIA)    Difficulty of Paying Living Expenses: Hard  Food Insecurity: No Food Insecurity (09/10/2022)   Hunger Vital Sign    Worried About Running Out of Food in the Last Year: Never true    Ran Out of Food in the Last Year: Never true  Transportation Needs: No Transportation Needs (09/10/2022)   PRAPARE - Administrator, Civil Service (Medical): No    Lack of Transportation (Non-Medical): No  Physical Activity: Not on file  Stress: Not on file  Social Connections: Moderately Isolated (06/03/2022)  Social Advertising account executive [NHANES]    Frequency of Communication with Friends and Family: More than three times a week    Frequency of Social Gatherings with Friends and Family: More than three times a week    Attends Religious Services: Never    Database administrator or Organizations: Yes    Attends Banker Meetings: Never    Marital Status: Never married  Intimate Partner Violence: Not At Risk (09/10/2022)   Humiliation, Afraid, Rape, and Kick questionnaire    Fear of Current or Ex-Partner: No    Emotionally Abused: No    Physically Abused: No    Sexually Abused: No    Review of Systems: ROS negative except for what is noted on the assessment and plan.  Vitals:   08/06/23 0924 08/06/23 0951  BP: (!) 166/87 (!) 166/83  Pulse: 93 85  Temp: 98.2 F (36.8 C)   TempSrc: Oral   SpO2: 98%   Weight: 269 lb 12.8 oz (122.4 kg)   Height: 5\' 2"  (1.575  m)     Physical Exam: Constitutional: obese, sitting in chair, in no acute distress Cardiovascular: regular rate and rhythm, no m/r/g Pulmonary/Chest: normal work of breathing on room air, lungs clear to auscultation bilaterally Abdominal: soft, non-tender, non-distended MSK: normal bulk and tone, left knee non-erythematous, no ecchymoses/swelling/effusion, without crepitus or joint line tenderness, straight leg negative Skin: warm and dry Psych: normal mood and behavior  Assessment & Plan:     Patient discussed with Dr. Mayford Knife  Osteoarthritis of left knee Chronic. Has tried injections that did not improve symptoms much. Last visit was given NSAIDs/Voltaren gel which also had limited benefit. Physical exam unremarkable. Given that above has not been helpful, will send a referral for PT and Wegovy to help with weight loss/force on knee.  Hypertension BP is 166/87 and 166/83. Patient states it is always elevated in office as she has to walk a long distance to get here. Ambulatory readings are consisently 120-130's. -If elevated at next visit consider bringing in at-home cuff to make sure it is calibrated  Morbid obesity [facility]) Prescription sent for [facility].   Tammie Solis, D.O. [facility] Internal Medicine, PGY-1 Phone: [phone] Date 08/09/2023 Time 8:01 AM

## 2023-08-09 ENCOUNTER — Telehealth: Payer: Self-pay

## 2023-08-09 DIAGNOSIS — Z0120 Encounter for dental examination and cleaning without abnormal findings: Secondary | ICD-10-CM | POA: Diagnosis not present

## 2023-08-09 NOTE — Telephone Encounter (Signed)
Tammie Solis (Key: [facility]) Rx #: [id number] [phone] 0.25MG /0.5ML auto-injectors Form [employer] of [facility] Electronic Prior Authorization Request Form [phone] NCPDP) Created Your prior authorization for [name] has been approved! More Info Personalized support and financial assistance may be available through the [employer] program. For more information, and to see program requirements, click on the More Info button to the right.  Message from plan: Approved. This drug has been approved. Approved quantity: 2 pens per 28 day(s). You may fill up to a 34 day supply at a retail pharmacy. You may fill up to a 90 day supply for maintenance drugs, please refer to the formulary for details. Please call the pharmacy to process your prescription claim.. Authorization Expiration Date: February 05, 2024.

## 2023-08-09 NOTE — Telephone Encounter (Signed)
Prior Authorization for patient Tammie Solis 0.25MG /0.5ML auto-injectors) came through on cover my meds was submitted with last office notes awaiting approval or denial.  ZOX:[id number]

## 2023-08-09 NOTE — Assessment & Plan Note (Signed)
Chronic. Has tried injections that did not improve symptoms much. Last visit was given NSAIDs/Voltaren gel which also had limited benefit. Physical exam unremarkable. Given that above has not been helpful, will send a referral for PT and Wegovy to help with weight loss/force on knee.

## 2023-08-09 NOTE — Assessment & Plan Note (Signed)
Prescription sent for Wegovy

## 2023-08-09 NOTE — Assessment & Plan Note (Addendum)
BP is 166/87 and 166/83. Patient states it is always elevated in office as she has to walk a long distance to get here. Ambulatory readings are consisently 120-130's. -If elevated at next visit consider bringing in at-home cuff to make sure it is calibrated

## 2023-08-10 ENCOUNTER — Ambulatory Visit (INDEPENDENT_AMBULATORY_CARE_PROVIDER_SITE_OTHER): Payer: Medicaid Other | Admitting: Licensed Clinical Social Worker

## 2023-08-10 DIAGNOSIS — F419 Anxiety disorder, unspecified: Secondary | ICD-10-CM

## 2023-08-10 DIAGNOSIS — F32A Depression, unspecified: Secondary | ICD-10-CM

## 2023-08-10 DIAGNOSIS — Z59819 Housing instability, housed unspecified: Secondary | ICD-10-CM

## 2023-08-10 NOTE — BH Specialist Note (Signed)
 Integrated Behavioral Health via Telemedicine Visit  08/10/2023 Tammie Solis [id number]  Number of Integrated Behavioral Health Clinician visits: Additional Visit  Session Start time: 1430   Session End time: 1530  Total time in minutes: 60   Referring Provider: PCP Patient/Family location: Home [facility] Provider location: Office All persons participating in visit: [facility] and Patient Types of Service: Individual psychotherapy and Telephone visit  I connected with Tammie Solis via  Telephone and verified that I am speaking with the correct person using two identifiers. Discussed confidentiality: Yes   I discussed the limitations of telemedicine and the availability of in person appointments.  Discussed there is a possibility of technology failure and discussed alternative modes of communication if that failure occurs.  I discussed that engaging in this telemedicine visit, they consent to the provision of behavioral healthcare and the services will be billed under their insurance.  Patient and/or legal guardian expressed understanding and consented to Telemedicine visit: Yes   Presenting Concerns: Patient and/or family reports the following symptoms/concerns: Patient discussed feeling overwhelmed with applying for Social Security Disability. Therapist responded to patients insight and offered coping skills to relieve anxiety and stress with application process. Patient spent duration of session discussing seeing her doctors and physical therapy. [facility] and patient discussed the importance of a support system and benefits of self care.   Patient and/or Family's Strengths/Protective Factors: Social connections  Goals Addressed: Patient will:  Reduce symptoms of: anxiety and depression   Progress towards Goals: Ongoing  Interventions: Interventions utilized:  CBT Cognitive Behavioral Therapy Standardized Assessments completed: Not Needed  Patient and/or Family Response: Patient agrees to  ongoing therapy services.   Assessment: Patient currently experiencing Anxiety and Depression.   Patient may benefit from Ongoing group support and OPT with [facility].  Plan: Follow up with behavioral health clinician on : Patient will schedule next follow up appointment when schedule allows her to.   I discussed the assessment and treatment plan with the patient and/or parent/guardian. They were provided an opportunity to ask questions and all were answered. They agreed with the plan and demonstrated an understanding of the instructions.   They were advised to call back or seek an in-person evaluation if the symptoms worsen or if the condition fails to improve as anticipated. Christen Butter, MSW, LCSW-A She/Her Behavioral Health Clinician [facility] [place]  Internal Medicine Center Direct Dial:[phone]  Fax [phone] Main Office Phone: [phone] [address] [place] [place], [place] [address] Website: [facility] Internal Medicine [facility]  [place], [place]  Industry

## 2023-08-16 NOTE — Progress Notes (Signed)
 Internal Medicine Clinic Attending  Case discussed with the resident at the time of the visit.  We reviewed the resident's history and exam and pertinent patient test results.  I agree with the assessment, diagnosis, and plan of care documented in the resident's note.

## 2023-08-17 ENCOUNTER — Ambulatory Visit (INDEPENDENT_AMBULATORY_CARE_PROVIDER_SITE_OTHER): Payer: Medicaid Other | Admitting: Radiology

## 2023-08-17 ENCOUNTER — Encounter: Payer: Self-pay | Admitting: Radiology

## 2023-08-17 VITALS — BP 138/86 | HR 71 | Ht 61.25 in | Wt 268.4 lb

## 2023-08-17 DIAGNOSIS — N950 Postmenopausal bleeding: Secondary | ICD-10-CM

## 2023-08-17 NOTE — Progress Notes (Signed)
   Tammie Solis [birth year] [id number]   History: Postmenopausal 58 y.o. presents as a new patient with c/o PMB x 2 years. Had regular periods until early 2023. Since she has had occasional spotting, bright red, enough to wear a liner at times. Lasts 2-3 days. No pain. No history of fibroids or polyps. Last pap normal 2 months ago.   Gynecologic History Postmenopausal Last Pap: 12/24. Results were: normal   Obstetric History OB History  Gravida Para Term Preterm AB Living  5 2 2  3 2   SAB IAB Ectopic Multiple Live Births  2  1      # Outcome Date GA Lbr Len/2nd Weight Sex Type Anes PTL Lv  5 SAB           4 SAB           3 Ectopic           2 Term           1 Term              The following portions of the patient's history were reviewed and updated as appropriate: allergies, current medications, past family history, past medical history, past social history, past surgical history, and problem list.  Review of Systems Pertinent items noted in HPI and remainder of comprehensive ROS otherwise negative.  Past medical history, past surgical history, family history and social history were all reviewed and documented in the EPIC chart.  Exam:  Vitals:   08/17/23 0921  BP: 138/86  Pulse: 71  SpO2: 98%  Weight: 268 lb 6.4 oz (121.7 kg)  Height: 5' 1.25" (1.556 m)   Body mass index is 50.3 kg/m.  Physical Exam Constitutional:      Appearance: Normal appearance. She is obese.  Pulmonary:     Effort: Pulmonary effort is normal.  Abdominal:     General: Abdomen is flat. Bowel sounds are normal.     Palpations: Abdomen is soft.  Genitourinary:    General: Normal vulva.     Vagina: Normal.     Cervix: Normal.     Uterus: Normal.      Adnexa: Right adnexa normal and left adnexa normal.     Comments: Difficult to fully assess adnexa via palpation due to habitus Neurological:     Mental Status: She is alert.  Psychiatric:        Mood and Affect: Mood normal.         Thought Content: Thought content normal.        Judgment: Judgment normal.        Raynelle Fanning, CMA present for exam  Assessment/Plan:   1. PMB (postmenopausal bleeding) (Primary)  - US Transvaginal Non-OB; Future- plan for EMB after u/s - [facility]      Kaiyla Stahly B WHNP-BC, 10:10 AM 08/17/2023

## 2023-08-18 LAB — FOLLICLE STIMULATING HORMONE: FSH: 20.6 m[IU]/mL

## 2023-08-18 NOTE — Therapy (Unsigned)
 OUTPATIENT PHYSICAL THERAPY LOWER EXTREMITY EVALUATION   Patient Name: Tammie Solis MRN: [id number] DOB:[birth year], 58 y.o., female Today's Date: 08/20/2023  END OF SESSION:  PT End of Session - 08/20/23 1142     Visit Number 1    Number of Visits 12    Date for PT Re-Evaluation 10/18/23    Authorization Type MCD    PT Start Time 1045    PT Stop Time 1130    PT Time Calculation (min) 45 min    Activity Tolerance Patient tolerated treatment well;No increased pain             Past Medical History:  Diagnosis Date   Allergy    Anemia    Arthritis    knees    Asthma    GERD (gastroesophageal reflux disease)    occasionally    Neuromuscular disorder (HCC)    sciatica left leg   Obesity    Prediabetes    Past Surgical History:  Procedure Laterality Date   CESAREAN SECTION     x2   Patient Active Problem List   Diagnosis Date Noted   Cervical cancer screening 06/21/2023   Financial insecurity 01/13/2023   Occipital neuralgia of left side 11/23/2022   Shoulder pain, right 09/10/2022   Vasomotor symptoms due to menopause 06/03/2022   Prediabetes 06/03/2022   Need for tetanus booster 01/29/2022   Fall 01/29/2022   Foot laceration, right, initial encounter 01/29/2022   Housing instability, currently housed, at risk for homelessness 09/05/2021   Mononeuropathy 09/05/2021   Thyroid nodule 07/05/2021   Polyneuropathy 07/05/2021   Cough 05/29/2021   Asthma 05/29/2021   Superficial phlebitis 03/17/2021   Left leg pain 02/13/2021   Health care maintenance 01/09/2021   Hyperlipidemia 07/30/2020   Osteoarthritis of left knee 07/26/2020   Hypertension 03/12/2020   Osteoarthritis of left hip 03/12/2020   Morbid obesity (HCC) 03/12/2020   Iron deficiency anemia 03/12/2020   Abnormal uterine bleeding 03/25/2015    PCP: Rudene Christians, DO   REFERRING PROVIDER: Reymundo Poll, MD  REFERRING DIAG: [phone] (ICD-10-CM) - Primary osteoarthritis of left knee M16.12  (ICD-10-CM) - Primary osteoarthritis of left hip  THERAPY DIAG:  Pain in left hip  Chronic pain of left knee  Muscle weakness (generalized)  Rationale for Evaluation and Treatment: Rehabilitation  ONSET DATE: chronic  SUBJECTIVE:   SUBJECTIVE STATEMENT: Reports a long history of L knee/hip pain, worse with prolonged positioning.  Has been offered CSI in the past with short term relief  PERTINENT HISTORY: Osteoarthritis of left knee Chronic. Has tried injections that did not improve symptoms much. Last visit was given NSAIDs/Voltaren gel which also had limited benefit. Physical exam unremarkable. Given that above has not been helpful, will send a referral for PT and Wegovy to help with weight loss/force on knee. PAIN:  Are you having pain? Yes: NPRS scale: 2-10/10 Pain location: L knee/hip Pain description: ache Aggravating factors: weight bearing and prolonged positioning Relieving factors: position changes  PRECAUTIONS: None  RED FLAGS: None   WEIGHT BEARING RESTRICTIONS: No  FALLS:  Has patient fallen in last 6 months? No  OCCUPATION: not working  PLOF: Independent  PATIENT GOALS: To mange my pain  NEXT MD VISIT: 3 months  OBJECTIVE:  Note: Objective measures were completed at Evaluation unless otherwise noted.  DIAGNOSTIC FINDINGS: none  PATIENT SURVEYS:  LEFS 23/80  MUSCLE LENGTH: Hamstrings: Right 80d deg; Left 80d deg   POSTURE:  increased valgus L knee  PALPATION: TTP posterior  L knee  LOWER EXTREMITY ROM:  Active ROM Right eval Left eval  Hip flexion 90d 90d  Hip extension    Hip abduction    Hip adduction    Hip internal rotation    Hip external rotation    Knee flexion 100d 100d  Knee extension 0d 0d  Ankle dorsiflexion    Ankle plantarflexion    Ankle inversion    Ankle eversion     (Blank rows = not tested)  LOWER EXTREMITY MMT:  MMT Right eval Left eval  Hip flexion    Hip extension    Hip abduction    Hip  adduction    Hip internal rotation    Hip external rotation    Knee flexion    Knee extension    Ankle dorsiflexion    Ankle plantarflexion    Ankle inversion    Ankle eversion     (Blank rows = not tested)  LOWER EXTREMITY SPECIAL TESTS:  Hip special tests: ITB positive R Knee special tests: Patellafemoral apprehension test: positive  and Lateral pull sign: positive   FUNCTIONAL TESTS:  30 seconds chair stand test 5x  GAIT: Distance walked: 49ftx2 Assistive device utilized: None Level of assistance: Complete Independence Comments: antalgic                                                                                                                                TREATMENT DATE:  [facility] Adult PT Treatment:                                                DATE: 08/20/23 Eval and HEP  Self Care: Additional minutes spent for educating on updated Therapeutic Home Exercise Program as well as comparing current status to condition at start of care. This included exercises focusing on stretching, strengthening, with focus on eccentric aspects. Long term goals include an improvement in range of motion, strength, endurance as well as avoiding reinjury. Patient's frequency would include in 1-2 times a day, 3-5 times a week for a duration of 6-12 weeks. Proper technique shown and discussed handout in great detail. All questions were discussed and addressed.      PATIENT EDUCATION:  Education details: Discussed eval findings, rehab rationale and POC and patient is in agreement  Person educated: Patient Education method: Explanation Education comprehension: verbalized understanding and needs further education  HOME EXERCISE PROGRAM: Access Code: [email or web address] URL: [email or web address] Date: 08/20/2023 Prepared by: Gustavus Bryant  Exercises - Supine Quad Set  - 2 x daily - 5 x weekly - 3 sets - 10 reps - 3s hold - Standing Heel Raise with Support  - 2 x daily - 5 x weekly - 3  sets - 10 reps - Clamshell  - 2 x daily - 5 x weekly - 3  sets - 10 reps  ASSESSMENT:  CLINICAL IMPRESSION: Patient is a 58 y.o. female who was seen today for physical therapy evaluation and treatment for chronic L hip/knee pain due to underlying degenerative changes.  Patient presents with decreased LLE ROM due in part to body habitus as well as suspected Baker's cyst in L knee.  L ITB tightness detected and 30s chair stand test finds LE weakness.   OBJECTIVE IMPAIRMENTS: Abnormal gait, decreased activity tolerance, decreased endurance, decreased mobility, difficulty walking, decreased ROM, decreased strength, increased fascial restrictions, improper body mechanics, postural dysfunction, obesity, and pain.   ACTIVITY LIMITATIONS: carrying, lifting, sitting, standing, squatting, stairs, and locomotion level  PERSONAL FACTORS: Fitness, Past/current experiences, Time since onset of injury/illness/exacerbation, and 1 comorbidity: OA  are also affecting patient's functional outcome.   REHAB POTENTIAL: Fair due to co-mobidities and obesity  CLINICAL DECISION MAKING: Stable/uncomplicated  EVALUATION COMPLEXITY: Moderate   GOALS: Goals reviewed with patient? No  SHORT TERM GOALS: Target date: 09/10/2023   Patient to demonstrate independence in HEP  Baseline: [id number] Goal status: INITIAL  2.  Assess 2 MWT Baseline: TBD Goal status: INITIAL   LONG TERM GOALS: Target date: 10/01/2023    Patient to demonstrate independence in HEP  Baseline: [id number] Goal status: INITIAL  2.  Patient will increase 30s chair stand reps from 5 to 8 with/without arms to demonstrate and improved functional ability with less pain/difficulty as well as reduce fall risk.  Baseline: 5 Goal status: INITIAL  3.  Patient will acknowledge 6/10 worst pain at least once during episode of care   Baseline: 10/10 Goal status: INITIAL  4.  Re-assess 2 MWT Baseline: TBD Goal status: INITIAL  5.  Improve L ITB  flexibility  Baseline: Pain with testing/stretch Goal status: INITIAL   PLAN:  PT FREQUENCY: 1-2x/week  PT DURATION: 6 weeks  PLANNED INTERVENTIONS: 97164- PT Re-evaluation, 97110-Therapeutic exercises, 97530- Therapeutic activity, 97112- Neuromuscular re-education, 97535- Self Care, 84696- Manual therapy, [phone]- Gait training, Balance training, and Stair training  PLAN FOR NEXT SESSION: HEP review and update, manual techniques as appropriate, aerobic tasks, ROM and flexibility activities, strengthening and PREs, TPDN, gait and balance training as needed    For all possible CPT codes, reference the Planned Interventions line above.     Check all conditions that are expected to impact treatment: {Conditions expected to impact treatment:Morbid obesity   If treatment provided at initial evaluation, no treatment charged due to lack of authorization.       Hildred Laser, PT 08/20/2023, 11:59 AM

## 2023-08-20 ENCOUNTER — Ambulatory Visit: Payer: Medicaid Other | Attending: Internal Medicine

## 2023-08-20 ENCOUNTER — Other Ambulatory Visit: Payer: Self-pay

## 2023-08-20 DIAGNOSIS — M6281 Muscle weakness (generalized): Secondary | ICD-10-CM | POA: Insufficient documentation

## 2023-08-20 DIAGNOSIS — G8929 Other chronic pain: Secondary | ICD-10-CM | POA: Diagnosis not present

## 2023-08-20 DIAGNOSIS — M1612 Unilateral primary osteoarthritis, left hip: Secondary | ICD-10-CM | POA: Insufficient documentation

## 2023-08-20 DIAGNOSIS — M1712 Unilateral primary osteoarthritis, left knee: Secondary | ICD-10-CM | POA: Diagnosis not present

## 2023-08-20 DIAGNOSIS — M25552 Pain in left hip: Secondary | ICD-10-CM | POA: Diagnosis not present

## 2023-08-20 DIAGNOSIS — M25562 Pain in left knee: Secondary | ICD-10-CM | POA: Diagnosis not present

## 2023-08-20 NOTE — Patient Instructions (Signed)

## 2023-08-21 DIAGNOSIS — Z419 Encounter for procedure for purposes other than remedying health state, unspecified: Secondary | ICD-10-CM | POA: Diagnosis not present

## 2023-08-23 ENCOUNTER — Other Ambulatory Visit (HOSPITAL_COMMUNITY): Payer: Self-pay

## 2023-08-27 ENCOUNTER — Ambulatory Visit: Payer: Medicaid Other

## 2023-08-29 ENCOUNTER — Encounter: Payer: Self-pay | Admitting: Emergency Medicine

## 2023-08-29 ENCOUNTER — Ambulatory Visit
Admission: EM | Admit: 2023-08-29 | Discharge: 2023-08-29 | Disposition: A | Discharge status: Home / Self Care | Attending: Family Medicine | Admitting: Family Medicine

## 2023-08-29 DIAGNOSIS — B309 Viral conjunctivitis, unspecified: Secondary | ICD-10-CM | POA: Diagnosis not present

## 2023-08-29 DIAGNOSIS — J4520 Mild intermittent asthma, uncomplicated: Secondary | ICD-10-CM | POA: Diagnosis not present

## 2023-08-29 DIAGNOSIS — J0120 Acute ethmoidal sinusitis, unspecified: Secondary | ICD-10-CM | POA: Diagnosis not present

## 2023-08-29 MED ORDER — ALBUTEROL SULFATE HFA 108 (90 BASE) MCG/ACT IN AERS
1.0000 | INHALATION_SPRAY | Freq: Four times a day (QID) | RESPIRATORY_TRACT | 0 refills | Status: DC | PRN
Start: 2023-08-29 — End: 2023-12-01
  Filled 2023-08-29: qty 18, 25d supply, fill #0

## 2023-08-29 MED ORDER — PREDNISONE 20 MG PO TABS
40.0000 mg | ORAL_TABLET | Freq: Every day | ORAL | 0 refills | Status: AC
Start: 2023-08-29 — End: 2023-09-05
  Filled 2023-08-29: qty 10, 5d supply, fill #0

## 2023-08-29 MED ORDER — DOXYCYCLINE HYCLATE 100 MG PO CAPS
100.0000 mg | ORAL_CAPSULE | Freq: Two times a day (BID) | ORAL | 0 refills | Status: AC
  Filled 2023-08-29: qty 14, 7d supply, fill #0

## 2023-08-29 NOTE — Discharge Instructions (Addendum)
 I have refilled your albuterol inhaler to use as needed.  Start doxycycline twice daily for 7 days.  Prednisone daily for 5 days.  Continue your Symbicort inhaler as needed.  Lots of rest and fluids.  Nasal rinses as tolerated.  Follow-up with your PCP if your symptoms do not improve.  Please go to the ER for any worsening symptoms.  Hope you feel better soon!

## 2023-08-29 NOTE — ED Provider Notes (Signed)
 [facility]    CSN: [id number] Arrival date & time: 08/29/23  1259      History   Chief Complaint Chief Complaint  Patient presents with   Facial Pain   Eye Drainage    HPI Tammie Solis is a 58 y.o. female  presents for evaluation of URI symptoms for 5-6 days. Patient reports associated symptoms of sinus pressure/pain with purulent nasal discharge, wheezing, and right eye redness with watery discharge/itching. Denies N/V/D, fevers, sore throat, ear pain, body aches, cough, shortness of breath. Patient does have a hx of asthma.  Has been using her Symbicort but is out of her albuterol inhaler.  Patient not not an active smoker.   Reports contacts via family.  Pt has taken nothing OTC for symptoms. Pt has no other concerns at this time.   HPI  Past Medical History:  Diagnosis Date   Allergy    Anemia    Arthritis    knees    Asthma    GERD (gastroesophageal reflux disease)    occasionally    Neuromuscular disorder (HCC)    sciatica left leg   Obesity    Prediabetes     Patient Active Problem List   Diagnosis Date Noted   Cervical cancer screening 06/21/2023   Financial insecurity 01/13/2023   Occipital neuralgia of left side 11/23/2022   Shoulder pain, right 09/10/2022   Vasomotor symptoms due to menopause 06/03/2022   Prediabetes 06/03/2022   Need for tetanus booster 01/29/2022   Fall 01/29/2022   Foot laceration, right, initial encounter 01/29/2022   Housing instability, currently housed, at risk for homelessness 09/05/2021   Mononeuropathy 09/05/2021   Thyroid nodule 07/05/2021   Polyneuropathy 07/05/2021   Cough 05/29/2021   Asthma 05/29/2021   Superficial phlebitis 03/17/2021   Left leg pain 02/13/2021   Health care maintenance 01/09/2021   Hyperlipidemia 07/30/2020   Osteoarthritis of left knee 07/26/2020   Hypertension 03/12/2020   Osteoarthritis of left hip 03/12/2020   Morbid obesity (HCC) 03/12/2020   Iron deficiency anemia 03/12/2020    Abnormal uterine bleeding 03/25/2015    Past Surgical History:  Procedure Laterality Date   CESAREAN SECTION     x2    OB History     Gravida  5   Para  2   Term  2   Preterm      AB  3   Living  2      SAB  2   IAB      Ectopic  1   Multiple      Live Births               Home Medications    Prior to Admission medications   Medication Sig Start Date End Date Taking? Authorizing Provider  albuterol (VENTOLIN HFA) 108 (90 Base) MCG/ACT inhaler Inhale 1-2 puffs into the lungs every 6 (six) hours as needed. 08/29/23  Yes Radford Pax, NP  doxycycline (VIBRAMYCIN) 100 MG capsule Take 1 capsule (100 mg total) by mouth 2 (two) times daily for 7 days. 08/29/23 09/05/23 Yes Radford Pax, NP  predniSONE (DELTASONE) 20 MG tablet Take 2 tablets (40 mg total) by mouth daily with breakfast for 5 days. 08/29/23 09/03/23 Yes Radford Pax, NP  acetaminophen (TYLENOL) 650 MG CR tablet Take 1 tablet (650 mg total) by mouth every 8 (eight) hours as needed for pain. 07/26/20   Roylene Reason, MD  budesonide-formoterol [facility]) 80-4.5 MCG/ACT inhaler Inhale 2 puffs  into the lungs 2 (two) times daily. 12/31/21   Masters, Katie, DO  diclofenac Sodium (VOLTAREN ARTHRITIS PAIN) 1 % GEL Apply 2 g topically 4 (four) times daily. Apply as needed. 01/13/23   Katheran James, DO  ibuprofen (ADVIL) 800 MG tablet Take 1 tablet (800 mg total) by mouth every 8 (eight) hours as needed. 11/23/22   Masters, Katie, DO  Loratadine 10 MG CAPS Take by mouth.    [provider]  Multiple Vitamins-Minerals (CENTRUM SILVER 50+WOMEN PO) Take 1 each by mouth daily.    [provider]  rosuvastatin (CRESTOR) 20 MG tablet Take 1 tablet (20 mg total) by mouth daily. 06/23/23 06/22/24  Masters, Florentina Addison, DO  Semaglutide-Weight Management (WEGOVY) 0.25 MG/0.5ML SOAJ Inject 0.25 mg into the skin once a week. Patient not taking: Reported on 08/17/2023 08/06/23   Carmina Miller, DO    Family  History Family History  Problem Relation Age of Onset   Diabetes Mother    Kidney failure Mother    Cancer Father        prostate   Prostate cancer Father    Colon cancer Neg Hx    Colon polyps Neg Hx    Esophageal cancer Neg Hx    Rectal cancer Neg Hx    Stomach cancer Neg Hx    Pancreatic cancer Neg Hx     Social History Social History   Tobacco Use   Smoking status: Never    Passive exposure: Never   Smokeless tobacco: Never  Substance Use Topics   Alcohol use: No   Drug use: No     Allergies   Penicillins   Review of Systems Review of Systems  HENT:  Positive for congestion, sinus pressure and sinus pain.   Eyes:  Positive for redness and itching.  Respiratory:  Positive for wheezing.      Physical Exam Triage Vital Signs ED Triage Vitals  Encounter Vitals Group     BP 08/29/23 1336 (!) 168/89     Systolic BP Percentile --      Diastolic BP Percentile --      Pulse Rate 08/29/23 1336 82     Resp 08/29/23 1336 18     Temp 08/29/23 1336 98.8 F (37.1 C)     Temp Source 08/29/23 1336 Oral     SpO2 08/29/23 1336 97 %     Weight --      Height --      Head Circumference --      Peak Flow --      Pain Score 08/29/23 1335 1     Pain Loc --      Pain Education --      Exclude from Growth Chart --    No data found.  Updated Vital Signs BP (!) 168/89 (BP Location: Left Arm)   Pulse 82   Temp 98.8 F (37.1 C) (Oral)   Resp 18   LMP 10/05/2016 (Approximate) Comment: spotting at times with breast soreness .  SpO2 97%   Visual Acuity Right Eye Distance:   Left Eye Distance:   Bilateral Distance:    Right Eye Near:   Left Eye Near:    Bilateral Near:     Physical Exam Vitals and nursing note reviewed.  Constitutional:      General: She is not in acute distress.    Appearance: She is well-developed. She is not ill-appearing.  HENT:     Head: Normocephalic and atraumatic.  Right Ear: Tympanic membrane and ear canal normal.     Left Ear:  Tympanic membrane and ear canal normal.     Nose: Congestion present.     Right Turbinates: Swollen and pale.     Left Turbinates: Swollen and pale.     Right Sinus: No maxillary sinus tenderness or frontal sinus tenderness.     Left Sinus: No maxillary sinus tenderness or frontal sinus tenderness.     Comments: TTP to ethmoid sinuses     Mouth/Throat:     Mouth: Mucous membranes are moist.     Pharynx: Oropharynx is clear. Uvula midline. No posterior oropharyngeal erythema.     Tonsils: No tonsillar exudate or tonsillar abscesses.  Eyes:     General: Lids are normal.     Conjunctiva/sclera:     Right eye: Right conjunctiva is injected. No chemosis, exudate or hemorrhage.    Pupils: Pupils are equal, round, and reactive to light.     Comments: Watery drainage noted  Cardiovascular:     Rate and Rhythm: Normal rate and regular rhythm.     Heart sounds: Normal heart sounds.  Pulmonary:     Effort: Pulmonary effort is normal.     Breath sounds: Normal breath sounds.  Musculoskeletal:     Cervical back: Normal range of motion and neck supple.  Lymphadenopathy:     Cervical: No cervical adenopathy.  Skin:    General: Skin is warm and dry.  Neurological:     General: No focal deficit present.     Mental Status: She is alert and oriented to person, place, and time.  Psychiatric:        Mood and Affect: Mood normal.        Behavior: Behavior normal.      UC Treatments / Results  Labs (all labs ordered are listed, but only abnormal results are displayed) Labs Reviewed - No data to display  EKG   Radiology No results found.  Procedures Procedures (including critical care time)  Medications Ordered in UC Medications - No data to display  Initial Impression / Assessment and Plan / UC Course  I have reviewed the triage vital signs and the nursing notes.  Pertinent labs & imaging results that were available during my care of the patient were reviewed by me and considered  in my medical decision making (see chart for details).     Reviewed exam and symptoms with patient.  No red flags.  Refilled albuterol inhaler and patient to continue Symbicort.  Will start prednisone daily for 5 days.  Doxycycline twice daily for 7 days for sinus infection.  Vies rest fluids and nasal rinses as needed.  PCP follow-up if symptoms do not improve.  ER precautions reviewed and patient verbalized understanding Final Clinical Impressions(s) / UC Diagnoses   Final diagnoses:  Viral conjunctivitis  Acute ethmoidal sinusitis, recurrence not specified  Mild intermittent asthma, unspecified whether complicated     Discharge Instructions      I have refilled your albuterol inhaler to use as needed.  Start doxycycline twice daily for 7 days.  Prednisone daily for 5 days.  Continue your Symbicort inhaler as needed.  Lots of rest and fluids.  Nasal rinses as tolerated.  Follow-up with your PCP if your symptoms do not improve.  Please go to the ER for any worsening symptoms.  Hope you feel better soon!   ED Prescriptions     Medication Sig Dispense Auth. Provider   albuterol (VENTOLIN  HFA) 108 (90 Base) MCG/ACT inhaler Inhale 1-2 puffs into the lungs every 6 (six) hours as needed. 1 each Radford Pax, NP   predniSONE (DELTASONE) 20 MG tablet Take 2 tablets (40 mg total) by mouth daily with breakfast for 5 days. 10 tablet Radford Pax, NP   doxycycline (VIBRAMYCIN) 100 MG capsule Take 1 capsule (100 mg total) by mouth 2 (two) times daily for 7 days. 14 capsule Radford Pax, NP      PDMP not reviewed this encounter.   Radford Pax, NP 08/29/23 [phone]

## 2023-08-29 NOTE — ED Triage Notes (Signed)
 Pt reports has allergies. Wed started having sinus pain. Reports woke up past 2 days with right eye drainage. Reports had wheezing and using her inhaler.

## 2023-08-30 ENCOUNTER — Ambulatory Visit: Payer: Medicaid Other | Admitting: Physical Therapy

## 2023-08-30 ENCOUNTER — Other Ambulatory Visit (HOSPITAL_COMMUNITY): Payer: Self-pay

## 2023-09-01 ENCOUNTER — Ambulatory Visit: Payer: Medicaid Other | Attending: Internal Medicine

## 2023-09-01 DIAGNOSIS — G8929 Other chronic pain: Secondary | ICD-10-CM | POA: Insufficient documentation

## 2023-09-01 DIAGNOSIS — M6281 Muscle weakness (generalized): Secondary | ICD-10-CM | POA: Diagnosis not present

## 2023-09-01 DIAGNOSIS — M25562 Pain in left knee: Secondary | ICD-10-CM | POA: Diagnosis not present

## 2023-09-01 DIAGNOSIS — M25552 Pain in left hip: Secondary | ICD-10-CM | POA: Insufficient documentation

## 2023-09-01 NOTE — Therapy (Signed)
 OUTPATIENT PHYSICAL THERAPY TREATMENT NOTE   Patient Name: Tammie Solis MRN: [id number] DOB:Feb 09, 1966, 58 y.o., female Today's Date: 09/01/2023  END OF SESSION:  PT End of Session - 09/01/23 0904     Visit Number 2    Number of Visits 12    Date for PT Re-Evaluation 10/18/23    Authorization Type MCD    Authorization Time Period 10 visits approved 08/20/23-10/19/23    Authorization - Visit Number 1    Authorization - Number of Visits 10    PT Start Time 0915    PT Stop Time 0953    PT Time Calculation (min) 38 min    Activity Tolerance Patient tolerated treatment well;Treatment limited secondary to medical complications (Comment);Other (comment)   Severe allergies, SOB   Behavior During Therapy WFL for tasks assessed/performed              Past Medical History:  Diagnosis Date   Allergy    Anemia    Arthritis    knees    Asthma    GERD (gastroesophageal reflux disease)    occasionally    Neuromuscular disorder (HCC)    sciatica left leg   Obesity    Prediabetes    Past Surgical History:  Procedure Laterality Date   CESAREAN SECTION     x2   Patient Active Problem List   Diagnosis Date Noted   Cervical cancer screening 06/21/2023   Financial insecurity 01/13/2023   Occipital neuralgia of left side 11/23/2022   Shoulder pain, right 09/10/2022   Vasomotor symptoms due to menopause 06/03/2022   Prediabetes 06/03/2022   Need for tetanus booster 01/29/2022   Fall 01/29/2022   Foot laceration, right, initial encounter 01/29/2022   Housing instability, currently housed, at risk for homelessness 09/05/2021   Mononeuropathy 09/05/2021   Thyroid nodule 07/05/2021   Polyneuropathy 07/05/2021   Cough 05/29/2021   Asthma 05/29/2021   Superficial phlebitis 03/17/2021   Left leg pain 02/13/2021   Health care maintenance 01/09/2021   Hyperlipidemia 07/30/2020   Osteoarthritis of left knee 07/26/2020   Hypertension 03/12/2020   Osteoarthritis of left hip 03/12/2020    Morbid obesity (HCC) 03/12/2020   Iron deficiency anemia 03/12/2020   Abnormal uterine bleeding 03/25/2015    PCP: Rudene Christians, DO   REFERRING PROVIDER: Reymundo Poll, MD  REFERRING DIAG: M17.12 (ICD-10-CM) - Primary osteoarthritis of left knee M16.12 (ICD-10-CM) - Primary osteoarthritis of left hip  THERAPY DIAG:  Pain in left hip  Chronic pain of left knee  Muscle weakness (generalized)  Rationale for Evaluation and Treatment: Rehabilitation  ONSET DATE: chronic  SUBJECTIVE:   SUBJECTIVE STATEMENT: Patient reports continued knee and hip pain, knee > hip. Has been dealing with severe allergies lately.   EVAL: Reports a long history of L knee/hip pain, worse with prolonged positioning.  Has been offered CSI in the past with short term relief  PERTINENT HISTORY: Osteoarthritis of left knee Chronic. Has tried injections that did not improve symptoms much. Last visit was given NSAIDs/Voltaren gel which also had limited benefit. Physical exam unremarkable. Given that above has not been helpful, will send a referral for PT and Wegovy to help with weight loss/force on knee. PAIN:  Are you having pain? Yes: NPRS scale: 2-10/10 Pain location: L knee/hip Pain description: ache Aggravating factors: weight bearing and prolonged positioning Relieving factors: position changes  PRECAUTIONS: None  RED FLAGS: None   WEIGHT BEARING RESTRICTIONS: No  FALLS:  Has patient fallen in last 6 months?  No  OCCUPATION: not working  PLOF: Independent  PATIENT GOALS: To mange my pain  NEXT MD VISIT: 3 months  OBJECTIVE:  Note: Objective measures were completed at Evaluation unless otherwise noted.  DIAGNOSTIC FINDINGS: none  PATIENT SURVEYS:  LEFS 23/80  MUSCLE LENGTH: Hamstrings: Right 80d deg; Left 80d deg   POSTURE:  increased valgus L knee  PALPATION: TTP posterior L knee  LOWER EXTREMITY ROM:  Active ROM Right eval Left eval  Hip flexion 90d 90d  Hip  extension    Hip abduction    Hip adduction    Hip internal rotation    Hip external rotation    Knee flexion 100d 100d  Knee extension 0d 0d  Ankle dorsiflexion    Ankle plantarflexion    Ankle inversion    Ankle eversion     (Blank rows = not tested)  LOWER EXTREMITY MMT:  MMT Right eval Left eval  Hip flexion    Hip extension    Hip abduction    Hip adduction    Hip internal rotation    Hip external rotation    Knee flexion    Knee extension    Ankle dorsiflexion    Ankle plantarflexion    Ankle inversion    Ankle eversion     (Blank rows = not tested)  LOWER EXTREMITY SPECIAL TESTS:  Hip special tests: ITB positive R Knee special tests: Patellafemoral apprehension test: positive  and Lateral pull sign: positive   FUNCTIONAL TESTS:  30 seconds chair stand test 5x  GAIT: Distance walked: 22ftx2 Assistive device utilized: None Level of assistance: Complete Independence Comments: antalgic                                                                                                                                TREATMENT DATE:  [facility] Adult PT Treatment:                                                DATE: 09/01/23 Therapeutic Exercise: Nustep level 5 x 5 mins Seated hamstring stretch 2x30" BIL Seated marching 2x30" LAQ 2x10 BIL Supine exercises performed on wedge for incline: Supine clamshell GTB 2x10 Supine marching GTB 2x30" Supine hip adduction ball squeeze 5" hold 2x10 Supine QS Lt 5" hold 2x10 Supine SAQ Lt 3" hold 2x10 Supine SLR Lt x10 STS arms crossed x10   [facility] Adult PT Treatment:                                                DATE: 08/20/23 Eval and HEP  Self Care: Additional minutes spent for educating on updated Therapeutic Home Exercise Program as well as  comparing current status to condition at start of care. This included exercises focusing on stretching, strengthening, with focus on eccentric aspects. Long term goals include an  improvement in range of motion, strength, endurance as well as avoiding reinjury. Patient's frequency would include in 1-2 times a day, 3-5 times a week for a duration of 6-12 weeks. Proper technique shown and discussed handout in great detail. All questions were discussed and addressed.      PATIENT EDUCATION:  Education details: Discussed eval findings, rehab rationale and POC and patient is in agreement  Person educated: Patient Education method: Explanation Education comprehension: verbalized understanding and needs further education  HOME EXERCISE PROGRAM: Access Code: [email or web address] URL: [email or web address] Date: 08/20/2023 Prepared by: Gustavus Bryant  Exercises - Supine Quad Set  - 2 x daily - 5 x weekly - 3 sets - 10 reps - 3s hold - Standing Heel Raise with Support  - 2 x daily - 5 x weekly - 3 sets - 10 reps - Clamshell  - 2 x daily - 5 x weekly - 3 sets - 10 reps  ASSESSMENT:  CLINICAL IMPRESSION: Patient presents to first follow up PT session reporting continued high levels of pain, especially in her Lt knee, and that she was initially compliant with her HEP but has had trouble lately due to flare up of allergies. She would like to pause PT for the time being until she is able to get better control of her allergies and breathing before she continues. Session today focused on gentle mat based proximal hip and LE strengthening. Patient continues to benefit from skilled PT services and should be progressed as able to improve functional independence.   EVAL: Patient is a 58 y.o. female who was seen today for physical therapy evaluation and treatment for chronic L hip/knee pain due to underlying degenerative changes.  Patient presents with decreased LLE ROM due in part to body habitus as well as suspected Baker's cyst in L knee.  L ITB tightness detected and 30s chair stand test finds LE weakness.   OBJECTIVE IMPAIRMENTS: Abnormal gait, decreased activity tolerance,  decreased endurance, decreased mobility, difficulty walking, decreased ROM, decreased strength, increased fascial restrictions, improper body mechanics, postural dysfunction, obesity, and pain.   ACTIVITY LIMITATIONS: carrying, lifting, sitting, standing, squatting, stairs, and locomotion level  PERSONAL FACTORS: Fitness, Past/current experiences, Time since onset of injury/illness/exacerbation, and 1 comorbidity: OA  are also affecting patient's functional outcome.   REHAB POTENTIAL: Fair due to co-mobidities and obesity  CLINICAL DECISION MAKING: Stable/uncomplicated  EVALUATION COMPLEXITY: Moderate   GOALS: Goals reviewed with patient? No  SHORT TERM GOALS: Target date: 09/10/2023   Patient to demonstrate independence in HEP  Baseline: [id number] Goal status: INITIAL  2.  Assess 2 MWT Baseline: TBD Goal status: INITIAL   LONG TERM GOALS: Target date: 10/01/2023    Patient to demonstrate independence in HEP  Baseline: [id number] Goal status: INITIAL  2.  Patient will increase 30s chair stand reps from 5 to 8 with/without arms to demonstrate and improved functional ability with less pain/difficulty as well as reduce fall risk.  Baseline: 5 Goal status: INITIAL  3.  Patient will acknowledge 6/10 worst pain at least once during episode of care   Baseline: 10/10 Goal status: INITIAL  4.  Re-assess 2 MWT Baseline: TBD Goal status: INITIAL  5.  Improve L ITB flexibility  Baseline: Pain with testing/stretch Goal status: INITIAL   PLAN:  PT FREQUENCY: 1-2x/week  PT DURATION: 6 weeks  PLANNED INTERVENTIONS:  09811- PT Re-evaluation, 97110-Therapeutic exercises, 97530- Therapeutic activity, [id number]- Neuromuscular re-education, 97535- Self Care, 91478- Manual therapy, [phone]- Gait training, Balance training, and Stair training  PLAN FOR NEXT SESSION: HEP review and update, manual techniques as appropriate, aerobic tasks, ROM and flexibility activities, strengthening and PREs,  TPDN, gait and balance training as needed    For all possible CPT codes, reference the Planned Interventions line above.     Check all conditions that are expected to impact treatment: {Conditions expected to impact treatment:Morbid obesity   If treatment provided at initial evaluation, no treatment charged due to lack of authorization.       Berta Minor, PTA 09/01/2023, 9:56 AM

## 2023-09-02 ENCOUNTER — Other Ambulatory Visit: Payer: Medicaid Other | Admitting: Radiology

## 2023-09-02 ENCOUNTER — Other Ambulatory Visit: Payer: Medicaid Other

## 2023-09-22 ENCOUNTER — Ambulatory Visit: Admitting: Radiology

## 2023-09-22 ENCOUNTER — Other Ambulatory Visit: Payer: Self-pay | Admitting: Radiology

## 2023-09-22 ENCOUNTER — Ambulatory Visit (INDEPENDENT_AMBULATORY_CARE_PROVIDER_SITE_OTHER)

## 2023-09-22 ENCOUNTER — Encounter: Payer: Self-pay | Admitting: Radiology

## 2023-09-22 VITALS — BP 146/94

## 2023-09-22 DIAGNOSIS — N950 Postmenopausal bleeding: Secondary | ICD-10-CM

## 2023-09-22 DIAGNOSIS — N840 Polyp of corpus uteri: Secondary | ICD-10-CM

## 2023-09-22 DIAGNOSIS — Z7689 Persons encountering health services in other specified circumstances: Secondary | ICD-10-CM | POA: Diagnosis not present

## 2023-09-22 DIAGNOSIS — R9389 Abnormal findings on diagnostic imaging of other specified body structures: Secondary | ICD-10-CM

## 2023-09-22 NOTE — Progress Notes (Signed)
   Darriana Deboy 1965-10-27 [id number]   History: Postmenopausal 58 y.o. presents for u/s to evluate PMB.   She presented 6 weeks ago with c/o PMB x 2 years. Had regular periods until early 2023. Since she has had occasional spotting, bright red, enough to wear a liner at times. Lasts 2-3 days. No pain. No history of fibroids or polyps.    Gynecologic History Postmenopausal Last Pap: 12/24. Results were: normal   Obstetric History OB History  Gravida Para Term Preterm AB Living  5 2 2  3 2   SAB IAB Ectopic Multiple Live Births  2  1      # Outcome Date GA Lbr Len/2nd Weight Sex Type Anes PTL Lv  5 SAB           4 SAB           3 Ectopic           2 Term           1 Term              The following portions of the patient's history were reviewed and updated as appropriate: allergies, current medications, past family history, past medical history, past social history, past surgical history, and problem list.  Review of Systems Pertinent items noted in HPI and remainder of comprehensive ROS otherwise negative.  Past medical history, past surgical history, family history and social history were all reviewed and documented in the EPIC chart.  Exam:  Vitals:   09/22/23 1014 09/22/23 1015  BP: (!) 142/90 (!) 146/94   There is no height or weight on file to calculate BMI.  Narrative & Impression Indication: post menopausal bleeding   Vaginal ultrasound   Anteverted size and shape 10.30 x5.87 x4.59 cm 1 intramural and 1 pedunculated fibroid noted   Thickened endometrium 17.12mm with a 22x43mm area with a feeder vessel   Right ovary normal, seen, transabdominally Left ovary not seen   No adnexal masses   No free fluid   Impression:Thickened endometrium with polyp, SHG recommended       Exam Ended: 09/22/23 10:13 Last Resulted: 09/22/23 10:57      Assessment/Plan:   1. Endometrial polyp (Primary) - [name] Sonohysterogram; Future  2. Thickened endometrium -  Endometrial biopsy; Future    Would like to have EMB done at time of [facility], will schedule together. Discussed reason for the [facility] and EMB and importance of scheduling this visit. Questions answered.    Arlie Solomons B WHNP-BC, 11:13 AM 09/22/2023

## 2023-09-28 ENCOUNTER — Other Ambulatory Visit (HOSPITAL_COMMUNITY): Payer: Self-pay

## 2023-09-30 ENCOUNTER — Other Ambulatory Visit (HOSPITAL_COMMUNITY): Payer: Self-pay

## 2023-10-02 DIAGNOSIS — Z419 Encounter for procedure for purposes other than remedying health state, unspecified: Secondary | ICD-10-CM | POA: Diagnosis not present

## 2023-10-04 ENCOUNTER — Ambulatory Visit: Payer: Self-pay | Admitting: Internal Medicine

## 2023-10-04 DIAGNOSIS — E7849 Other hyperlipidemia: Secondary | ICD-10-CM

## 2023-10-04 DIAGNOSIS — R7303 Prediabetes: Secondary | ICD-10-CM

## 2023-10-04 NOTE — Telephone Encounter (Signed)
 Copied from CRM [id number]. Topic: Clinical - Medication Question >> Oct 04, 2023 10:16 AM Bearl Botts A wrote: Reason for CRM: Patient is calling to see if she can start getting her medications on a 90 day supply instead of every month. rosuvastatin (CRESTOR) 20 MG tablet Semaglutide-Weight Management (WEGOVY) 0.25 MG/0.5ML SOAJ  [facility] [address] [place] [address] Phone: [phone] Fax: [phone] Hours: M-F 7:30am-6pm

## 2023-10-05 ENCOUNTER — Other Ambulatory Visit (HOSPITAL_COMMUNITY): Payer: Self-pay

## 2023-10-05 MED ORDER — ROSUVASTATIN CALCIUM 20 MG PO TABS
20.0000 mg | ORAL_TABLET | Freq: Every day | ORAL | 3 refills | Status: AC
  Filled 2023-10-06: qty 90, 90d supply, fill #0
  Filled 2024-01-17: qty 90, 90d supply, fill #1
  Filled 2024-04-26: qty 90, 90d supply, fill #2
  Filled 2024-07-25: qty 90, 90d supply, fill #3

## 2023-10-05 NOTE — Telephone Encounter (Signed)
 Crestor refill was sent to the pharmacy.

## 2023-10-06 ENCOUNTER — Other Ambulatory Visit (HOSPITAL_COMMUNITY): Payer: Self-pay

## 2023-10-06 MED ORDER — WEGOVY 0.25 MG/0.5ML ~~LOC~~ SOAJ
0.2500 mg | SUBCUTANEOUS | 3 refills | Status: DC
  Filled 2023-10-06: qty 2, 28d supply, fill #0
  Filled 2023-10-29: qty 2, 28d supply, fill #1

## 2023-10-14 ENCOUNTER — Other Ambulatory Visit: Payer: Self-pay | Admitting: Obstetrics and Gynecology

## 2023-10-14 DIAGNOSIS — R9389 Abnormal findings on diagnostic imaging of other specified body structures: Secondary | ICD-10-CM

## 2023-10-14 DIAGNOSIS — N840 Polyp of corpus uteri: Secondary | ICD-10-CM

## 2023-10-20 ENCOUNTER — Other Ambulatory Visit (HOSPITAL_COMMUNITY)
Admission: RE | Admit: 2023-10-20 | Discharge: 2023-10-20 | Disposition: A | Discharge status: Home / Self Care | Source: Ambulatory Visit | Attending: Obstetrics and Gynecology | Admitting: Obstetrics and Gynecology

## 2023-10-20 ENCOUNTER — Ambulatory Visit (INDEPENDENT_AMBULATORY_CARE_PROVIDER_SITE_OTHER)

## 2023-10-20 ENCOUNTER — Encounter: Payer: Self-pay | Admitting: Obstetrics and Gynecology

## 2023-10-20 ENCOUNTER — Ambulatory Visit: Admitting: Obstetrics and Gynecology

## 2023-10-20 VITALS — BP 134/76 | HR 75 | Ht 62.0 in | Wt 268.0 lb

## 2023-10-20 DIAGNOSIS — N950 Postmenopausal bleeding: Secondary | ICD-10-CM | POA: Diagnosis not present

## 2023-10-20 DIAGNOSIS — N840 Polyp of corpus uteri: Secondary | ICD-10-CM

## 2023-10-20 NOTE — Progress Notes (Signed)
 58 y.o. [id number] female here for postmenopausal bleeding and thickened endometrium for SIS and endometrial biopsy. Single.  Patient was initially evaluated by Angelina Bare, NP  Patient's last menstrual period was 10/05/2016 (approximate).   On 09/22/2023 at her appointment with Georgine Kitchens, NP, it was noted: She presented 6 weeks ago with c/o PMB x 2 years. Had regular periods until early 2023. Since she has had occasional spotting, bright red, enough to wear a liner at times. Lasts 2-3 days. No pain. No history of fibroids or polyps.   10/20/23 TVUS:  Anteverted size and shape 10.30 x5.87 x4.59 cm 1 intramural and 1 pedunculated fibroid noted  Thickened endometrium 17.43mm with a 22x34mm area with a feeder vessel  Right ovary normal, seen, transabdominally Left ovary not seen  Impression:Thickened endometrium with polyp, SHG recommended     Component Value Date/Time   DIAGPAP  06/21/2023 1605    - Negative for intraepithelial lesion or malignancy (NILM)   HPVHIGH Negative 06/21/2023 1605   ADEQPAP  06/21/2023 1605    Satisfactory for evaluation; transformation zone component PRESENT.   GYN HISTORY: Prior CS  OB History  Gravida Para Term Preterm AB Living  5 2 2  3 2   SAB IAB Ectopic Multiple Live Births  2  1      # Outcome Date GA Lbr Len/2nd Weight Sex Type Anes PTL Lv  5 SAB           4 SAB           3 Ectopic           2 Term           1 Term             Past Medical History:  Diagnosis Date   Allergy    Anemia    Arthritis    knees    Asthma    GERD (gastroesophageal reflux disease)    occasionally    Neuromuscular disorder (HCC)    sciatica left leg   Obesity    Prediabetes     Past Surgical History:  Procedure Laterality Date   CESAREAN SECTION     x2    Current Outpatient Medications on File Prior to Visit  Medication Sig Dispense Refill   acetaminophen  (TYLENOL ) 650 MG CR tablet Take 1 tablet (650 mg total) by mouth every 8 (eight) hours as needed  for pain. 60 tablet 0   albuterol  (VENTOLIN  HFA) 108 (90 Base) MCG/ACT inhaler Inhale 1-2 puffs into the lungs every 6 (six) hours as needed. 18 g 0   budesonide -formoterol  (SYMBICORT ) 80-4.5 MCG/ACT inhaler Inhale 2 puffs into the lungs 2 (two) times daily. 10.2 g 12   diclofenac  Sodium (VOLTAREN  ARTHRITIS PAIN) 1 % GEL Apply 2 g topically 4 (four) times daily. Apply as needed. 100 g 3   ibuprofen  (ADVIL ) 800 MG tablet Take 1 tablet (800 mg total) by mouth every 8 (eight) hours as needed. 10 tablet 0   Loratadine 10 MG CAPS Take by mouth.     Multiple Vitamins-Minerals (CENTRUM SILVER 50+WOMEN PO) Take 1 each by mouth daily.     rosuvastatin  (CRESTOR ) 20 MG tablet Take 1 tablet (20 mg total) by mouth daily. 90 tablet 3   Semaglutide -Weight Management (WEGOVY ) 0.25 MG/0.5ML SOAJ Inject 0.25 mg into the skin once a week. 6 mL 3   No current facility-administered medications on file prior to visit.    Allergies  Allergen Reactions   Penicillins  Freezes up my system- gets the shakes and feels cold      PE Today's Vitals   10/20/23 0933  BP: 134/76  Pulse: 75  SpO2: 98%  Weight: 268 lb (121.6 kg)  Height: 5\' 2"  (1.575 m)   Body mass index is 49.02 kg/m.  Physical Exam Vitals reviewed. Exam conducted with a chaperone present.  Constitutional:      General: She is not in acute distress.    Appearance: Normal appearance.  HENT:     Head: Normocephalic and atraumatic.     Nose: Nose normal.  Eyes:     Extraocular Movements: Extraocular movements intact.     Conjunctiva/sclera: Conjunctivae normal.  Pulmonary:     Effort: Pulmonary effort is normal.  Genitourinary:    General: Normal vulva.     Exam position: Lithotomy position.     Vagina: Normal. No vaginal discharge.     Cervix: Normal. No cervical motion tenderness, discharge or lesion.     Uterus: Normal. Not enlarged and not tender.      Adnexa: Right adnexa normal and left adnexa normal.  Musculoskeletal:         General: Normal range of motion.     Cervical back: Normal range of motion.  Neurological:     General: No focal deficit present.     Mental Status: She is alert.  Psychiatric:        Mood and Affect: Mood normal.        Behavior: Behavior normal.     10/20/23 TVUS/SIS:  Indications: Postmenopausal bleeding, endometrial thickening  Findings:   Uterus: Anteverted uterus Endometrial thickness: 4.1 mm.  11 mm filling defect noted on SIS. No free fluid.  Procedure - sonohysterogram Consent and timeout performed. Speculum placed in vagina. Sterile prep of cervix with  betadine. Cannula placed inside endometrial cavity without difficulty. Speculum removed. Sterile saline injected.            11mm  filling defect noted. Cannula removed. No complication.   Impression:  11mm  filling defect noted, likely endometrial polyp  Romaine Closs, MD   Procedure - endometrial biopsy Consent performed. Speculum place in vagina.  Sterile prep of cervix with  betadine Tenaculum to anterior cervical lip. Pipelle placed to 8 cm without difficulty. Tissue obtained and sent to pathology. Speculum removed.  No complications. Minimal EBL.  Assessment and Plan:        PMB (postmenopausal bleeding) -     Surgical pathology  Endometrial polyp -     Surgical pathology  SIS with 11mm endometrial polyp Uncx SIS and EMB Recommend hyst if polyp is not seen in EMB.   Romaine Closs, MD

## 2023-10-21 LAB — SURGICAL PATHOLOGY

## 2023-10-22 ENCOUNTER — Encounter: Payer: Self-pay | Admitting: Obstetrics and Gynecology

## 2023-11-01 DIAGNOSIS — Z419 Encounter for procedure for purposes other than remedying health state, unspecified: Secondary | ICD-10-CM | POA: Diagnosis not present

## 2023-11-11 ENCOUNTER — Ambulatory Visit: Admitting: Obstetrics and Gynecology

## 2023-11-16 ENCOUNTER — Ambulatory Visit: Admitting: Obstetrics and Gynecology

## 2023-11-16 ENCOUNTER — Encounter: Payer: Self-pay | Admitting: Obstetrics and Gynecology

## 2023-11-16 VITALS — BP 142/82 | HR 79 | Temp 99.1°F | Wt 268.4 lb

## 2023-11-16 DIAGNOSIS — N950 Postmenopausal bleeding: Secondary | ICD-10-CM | POA: Diagnosis not present

## 2023-11-16 DIAGNOSIS — N840 Polyp of corpus uteri: Secondary | ICD-10-CM | POA: Insufficient documentation

## 2023-11-16 NOTE — Assessment & Plan Note (Signed)
 Plan for hysteroscopy, dilation and curettage, polypectomy.  Discussed outpatient procedure. Reviewed that  recovery is usually 1-2 days. Risks including infections, bleeding, and damage to surrounding organs reviewed. Recommend NPO prior to midnight and reviewed medication to take on day of surgery. Dicussed use of NSAIDS as needed for pain postoperatively.  Preop checklist: Antibiotics: none DVT ppx: SCDs Postop visit: 1 week Additional clearance: medical

## 2023-11-16 NOTE — Progress Notes (Signed)
 58 y.o. [id number] female with asthma, HTN (no meds), HLD here for postmenopausal bleeding, thickened endometrium, and endometrial polyp for preoperative exam. Single.  Patient was initially evaluated by Angelina Bare, NP  Patient's last menstrual period was 10/05/2016 (approximate).   On 09/22/2023 at her appointment with Georgine Kitchens, NP, it was noted: She presented 6 weeks ago with c/o PMB x 2 years. Had regular periods until early 2023. Since she has had occasional spotting, bright red, enough to wear a liner at times. Lasts 2-3 days. No pain. No history of fibroids or polyps.   11/16/23 TVUS:  Anteverted size and shape 10.30 x5.87 x4.59 cm 1 intramural and 1 pedunculated fibroid noted  Thickened endometrium 17.37mm with a 22x58mm area with a feeder vessel  Right ovary normal, seen, transabdominally Left ovary not seen Impression:Thickened endometrium with polyp, SHG recommended  10/20/23 TVUS/SIS:  Indications: Postmenopausal bleeding, endometrial thickening Findings:  Uterus: Anteverted uterus Endometrial thickness: 4.1 mm.  11 mm filling defect noted on SIS. No free fluid. Impression:  11mm  filling defect noted, likely endometrial polyp  10/20/23 pathology: A. ENDOMETRIUM, BIOPSY:  Fragmented benign proliferative phase endometrium  Benign endocervical epithelium  Negative for polyp, breakdown, atypia, hyperplasia and carcinoma   No PMB since SIS and EMB with me. No CP or SOB. Prior hospitalization for asthma ~30 years. No prior intubation. Last inhaler use 1 month ago.     Component Value Date/Time   DIAGPAP  06/21/2023 1605    - Negative for intraepithelial lesion or malignancy (NILM)   HPVHIGH Negative 06/21/2023 1605   ADEQPAP  06/21/2023 1605    Satisfactory for evaluation; transformation zone component PRESENT.   GYN HISTORY: Prior CS  OB History  Gravida Para Term Preterm AB Living  5 2 2  3 2   SAB IAB Ectopic Multiple Live Births  2  1      # Outcome Date GA  Lbr Len/2nd Weight Sex Type Anes PTL Lv  5 SAB           4 SAB           3 Ectopic           2 Term           1 Term             Past Medical History:  Diagnosis Date   Allergy    Anemia    Arthritis    knees    Asthma    GERD (gastroesophageal reflux disease)    occasionally    Neuromuscular disorder (HCC)    sciatica left leg   Obesity    Prediabetes     Past Surgical History:  Procedure Laterality Date   CESAREAN SECTION     x2   COLONOSCOPY  05/02/2018    Current Outpatient Medications on File Prior to Visit  Medication Sig Dispense Refill   albuterol  (VENTOLIN  HFA) 108 (90 Base) MCG/ACT inhaler Inhale 1-2 puffs into the lungs every 6 (six) hours as needed. 18 g 0   budesonide -formoterol  (SYMBICORT ) 80-4.5 MCG/ACT inhaler Inhale 2 puffs into the lungs 2 (two) times daily. 10.2 g 12   Loratadine 10 MG CAPS Take by mouth.     Multiple Vitamins-Minerals (CENTRUM SILVER 50+WOMEN PO) Take 1 each by mouth daily.     rosuvastatin  (CRESTOR ) 20 MG tablet Take 1 tablet (20 mg total) by mouth daily. 90 tablet 3   Semaglutide -Weight Management (WEGOVY ) 0.25 MG/0.5ML SOAJ Inject 0.25 mg into the skin  once a week. 6 mL 3   No current facility-administered medications on file prior to visit.    Allergies  Allergen Reactions   Penicillins     Freezes up my system- gets the shakes and feels cold      PE Today's Vitals   11/16/23 1212  BP: (!) 142/82  Pulse: 79  Temp: 99.1 F (37.3 C)  TempSrc: Oral  SpO2: 97%  Weight: 268 lb 6.4 oz (121.7 kg)   Body mass index is 49.09 kg/m.  Physical Exam Vitals reviewed.  Constitutional:      General: She is not in acute distress.    Appearance: Normal appearance.  HENT:     Head: Normocephalic and atraumatic.     Nose: Nose normal.  Eyes:     Extraocular Movements: Extraocular movements intact.     Conjunctiva/sclera: Conjunctivae normal.  Cardiovascular:     Rate and Rhythm: Normal rate and regular rhythm.     Heart  sounds: No murmur heard.    No friction rub. No gallop.  Pulmonary:     Effort: Pulmonary effort is normal. No respiratory distress.     Breath sounds: Normal breath sounds. No stridor. No wheezing, rhonchi or rales.  Musculoskeletal:        General: Normal range of motion.     Cervical back: Normal range of motion.  Neurological:     General: No focal deficit present.     Mental Status: She is alert.  Psychiatric:        Mood and Affect: Mood normal.        Behavior: Behavior normal.    Assessment and Plan:        PMB (postmenopausal bleeding) Assessment & Plan: Plan for hysteroscopy, dilation and curettage, polypectomy.  Discussed outpatient procedure. Reviewed that  recovery is usually 1-2 days. Risks including infections, bleeding, and damage to surrounding organs reviewed. Recommend NPO prior to midnight and reviewed medication to take on day of surgery. Dicussed use of NSAIDS as needed for pain postoperatively.  Preop checklist: Antibiotics: none DVT ppx: SCDs Postop visit: 1 week Additional clearance: medical    Orders: -     Ambulatory Referral For Surgery Scheduling  Endometrial polyp Assessment & Plan: As above  Orders: -     Ambulatory Referral For Surgery Scheduling   Romaine Closs, MD

## 2023-11-16 NOTE — Patient Instructions (Addendum)
 Preoperative instructions: Nothing to eat or drink after midnight, unless instructed differently regarding clear liquids by the anesthesia team at [facility]. Do not take any medications on the day of surgery, except those listed below: NONE Wegovy  has to be stopped 2 weeks prior to surgery. Please follow all other instructions as provided by our surgical scheduler at [facility] and the anesthesia team at [facility].  Postoperative instructions: Take ibuprofen  as prescribed and over-the-counter Tylenol  as needed.

## 2023-11-16 NOTE — Assessment & Plan Note (Signed)
-  As above.

## 2023-11-23 ENCOUNTER — Encounter: Payer: Self-pay | Admitting: *Deleted

## 2023-11-23 DIAGNOSIS — Z0120 Encounter for dental examination and cleaning without abnormal findings: Secondary | ICD-10-CM | POA: Diagnosis not present

## 2023-12-01 ENCOUNTER — Other Ambulatory Visit: Payer: Self-pay

## 2023-12-01 ENCOUNTER — Ambulatory Visit: Admitting: Student

## 2023-12-01 ENCOUNTER — Other Ambulatory Visit (HOSPITAL_COMMUNITY): Payer: Self-pay

## 2023-12-01 ENCOUNTER — Ambulatory Visit: Admitting: Radiology

## 2023-12-01 VITALS — BP 165/105 | HR 80 | Temp 98.6°F | Ht 62.0 in | Wt 267.0 lb

## 2023-12-01 DIAGNOSIS — J45909 Unspecified asthma, uncomplicated: Secondary | ICD-10-CM

## 2023-12-01 DIAGNOSIS — Z01818 Encounter for other preprocedural examination: Secondary | ICD-10-CM | POA: Diagnosis not present

## 2023-12-01 DIAGNOSIS — J4520 Mild intermittent asthma, uncomplicated: Secondary | ICD-10-CM

## 2023-12-01 DIAGNOSIS — D509 Iron deficiency anemia, unspecified: Secondary | ICD-10-CM

## 2023-12-01 DIAGNOSIS — Z6841 Body Mass Index (BMI) 40.0 and over, adult: Secondary | ICD-10-CM | POA: Diagnosis not present

## 2023-12-01 DIAGNOSIS — I10 Essential (primary) hypertension: Secondary | ICD-10-CM

## 2023-12-01 MED ORDER — LISINOPRIL-HYDROCHLOROTHIAZIDE 20-12.5 MG PO TABS
1.0000 | ORAL_TABLET | Freq: Every day | ORAL | 3 refills | Status: AC
  Filled 2023-12-01 (×2): qty 90, 90d supply, fill #0
  Filled 2024-03-13: qty 90, 90d supply, fill #1
  Filled 2024-07-06: qty 90, 90d supply, fill #2

## 2023-12-01 MED ORDER — ALBUTEROL SULFATE HFA 108 (90 BASE) MCG/ACT IN AERS
1.0000 | INHALATION_SPRAY | Freq: Four times a day (QID) | RESPIRATORY_TRACT | 2 refills | Status: AC | PRN
  Filled 2023-12-01 (×2): qty 18, 25d supply, fill #0
  Filled 2024-03-13: qty 18, 25d supply, fill #1

## 2023-12-01 MED ORDER — SEMAGLUTIDE-WEIGHT MANAGEMENT 0.5 MG/0.5ML ~~LOC~~ SOAJ
0.5000 mg | SUBCUTANEOUS | 1 refills | Status: DC
  Filled 2023-12-01: qty 2, 28d supply, fill #0
  Filled 2024-01-17 – 2024-03-15 (×8): qty 2, 28d supply, fill #1

## 2023-12-01 MED ORDER — BUDESONIDE-FORMOTEROL FUMARATE 80-4.5 MCG/ACT IN AERO
2.0000 | INHALATION_SPRAY | Freq: Two times a day (BID) | RESPIRATORY_TRACT | 6 refills | Status: AC
  Filled 2023-12-01 (×2): qty 10.2, 30d supply, fill #0
  Filled 2024-03-13: qty 10.2, 30d supply, fill #1

## 2023-12-01 NOTE — Progress Notes (Signed)
 CC: Medication refill, preop clearance form  HPI:  Tammie Solis is a 58 y.o. female living with a history stated below and presents today for medication refill and preop clearance form. Please see problem based assessment and plan for additional details.  Past Medical History:  Diagnosis Date   Allergy    Anemia    Arthritis    knees    Asthma    GERD (gastroesophageal reflux disease)    occasionally    Neuromuscular disorder (HCC)    sciatica left leg   Obesity    Prediabetes     Current Outpatient Medications on File Prior to Visit  Medication Sig Dispense Refill   albuterol  (VENTOLIN  HFA) 108 (90 Base) MCG/ACT inhaler Inhale 1-2 puffs into the lungs every 6 (six) hours as needed. 18 g 0   budesonide -formoterol  (SYMBICORT ) 80-4.5 MCG/ACT inhaler Inhale 2 puffs into the lungs 2 (two) times daily. 10.2 g 12   Loratadine 10 MG CAPS Take by mouth.     Multiple Vitamins-Minerals (CENTRUM SILVER 50+WOMEN PO) Take 1 each by mouth daily.     rosuvastatin  (CRESTOR ) 20 MG tablet Take 1 tablet (20 mg total) by mouth daily. 90 tablet 3   Semaglutide -Weight Management (WEGOVY ) 0.25 MG/0.5ML SOAJ Inject 0.25 mg into the skin once a week. 6 mL 3   No current facility-administered medications on file prior to visit.    Family History  Problem Relation Age of Onset   Diabetes Mother    Kidney failure Mother    Cancer Father        prostate   Prostate cancer Father    Colon cancer Neg Hx    Colon polyps Neg Hx    Esophageal cancer Neg Hx    Rectal cancer Neg Hx    Stomach cancer Neg Hx    Pancreatic cancer Neg Hx     Social History   Socioeconomic History   Marital status: Single    Spouse name: Not on file   Number of children: Not on file   Years of education: Not on file   Highest education level: Not on file  Occupational History   Not on file  Tobacco Use   Smoking status: Never    Passive exposure: Never   Smokeless tobacco: Never  Substance and Sexual  Activity   Alcohol use: No   Drug use: No   Sexual activity: Not Currently    Partners: Male    Birth control/protection: Abstinence    Comment: menarche 58yo, sexual debut 58yo  Other Topics Concern   Not on file  Social History Narrative   Not on file   Social Drivers of Health   Financial Resource Strain: High Risk (09/29/2021)   Overall Financial Resource Strain (CARDIA)    Difficulty of Paying Living Expenses: Hard  Food Insecurity: No Food Insecurity (09/10/2022)   Hunger Vital Sign    Worried About Running Out of Food in the Last Year: Never true    Ran Out of Food in the Last Year: Never true  Transportation Needs: No Transportation Needs (09/10/2022)   PRAPARE - Administrator, Civil Service (Medical): No    Lack of Transportation (Non-Medical): No  Physical Activity: Not on file  Stress: Not on file  Social Connections: Moderately Isolated (06/03/2022)   Social Connection and Isolation Panel [NHANES]    Frequency of Communication with Friends and Family: More than three times a week    Frequency of Social Gatherings with  Friends and Family: More than three times a week    Attends Religious Services: Never    Database administrator or Organizations: Yes    Attends Banker Meetings: Never    Marital Status: Never married  Intimate Partner Violence: Not At Risk (09/10/2022)   Humiliation, Afraid, Rape, and Kick questionnaire    Fear of Current or Ex-Partner: No    Emotionally Abused: No    Physically Abused: No    Sexually Abused: No    Review of Systems: ROS negative except for what is noted on the assessment and plan.  Vitals:   12/01/23 1010 12/01/23 1016  BP: (!) 180/113 (!) 165/105  Pulse: 84 80  Temp: 98.6 F (37 C)   TempSrc: Oral   SpO2: 96%   Weight: 267 lb (121.1 kg)   Height: 5' 2 (1.575 m)    Physical Exam: Constitutional: alert, sitting up in chair comfortably, in no acute distress Cardiovascular: regular rate and  rhythm Pulmonary/Chest: normal work of breathing on room air, lungs clear to auscultation bilaterally, no wheezing  Neurological: alert & oriented x 3 Psych: pleasant mood  Assessment & Plan:   Preoperative clearance Presents for preoperative clearance for diagnostic hysteroscopy with possible D&C and polypectomy with GYN (Dr. Andrena Ke). Procedure date is TBD. RCRI = 0 3.9%. DASI = 18.95 points, 5.07 METs. Denies active chest pain, SOB, or acute symptoms. Patient is low risk for this procedure. Will hold Wegovy  2 weeks prior to surgery.   Plan -Complete clearance letter and fax to GYN office -Hold Wegovy  2 weeks prior to surgery  -Repeat BMP and CBC today   Morbid obesity (HCC) BMI 48.83. Tolerating Wegovy  0.25 mg weekly for past 1-2 months. Notable for some weight loss. Will titrate up as tolerated.   Plan -Increase Wegovy  to 0.5 mg weekly x 4 weeks, can titrate up further as tolerated  -Plans to hold 2 weeks before hysteroscopy (TBD)  Hypertension Repeat BP remains elevated 165/105. Asymptomatic, denies chest pain, SOB, headache, n/v, blurry vision. Reports was taken off antihypertensive a few months ago since BP better and financial concerns. Given her BP remains elevated in past few OV, will restart lisinopril -hydrochlorothiazide  20-12.5 mg daily (states covered and should be able to afford copay this time). Last BMP 11/2022 was normal electrolytes and renal function.   Plan -Start lisinopril -hydrochlorothiazide  20-12.5 mg -F/u in 2-4 weeks with RN BP visit and lab-only visit  -Patient to record BP readings at home   Asthma Refilled Symbicort  and PRN albuterol . No acute concerns and lung exam unremarkable today.    Patient discussed with Dr. Ofilia Benton, D.O. [facility], PGY-2 Phone: [phone] Date 12/01/2023 Time 7:46 AM

## 2023-12-01 NOTE — Patient Instructions (Addendum)
 Thank you, [name] for allowing us  to provide your care today. Today we discussed   -Blood pressure today is high still. Will start lisinopril -hydrochlorothiazide  20-12.5 mg once a day.  -Record blood pressure at home 3-4 times week  -Blood work today to check blood counts, electrolytes and kidneys. -Will complete clearance form for your OBGYN office.   -Increase Wegovy  to 0.5 mg once a week. Continue for at least 4 weeks and if doing well can increase again.     I have ordered the following medication/changed the following medications:  Start the following medications: Meds ordered this encounter  Medications   Semaglutide -Weight Management 0.5 MG/0.5ML SOAJ    Sig: Inject 0.5 mg into the skin once a week.    Dispense:  2 mL    Refill:  1   budesonide -formoterol  (SYMBICORT ) 80-4.5 MCG/ACT inhaler    Sig: Inhale 2 puffs into the lungs 2 (two) times daily.    Dispense:  10.2 g    Refill:  6    IM program   albuterol  (VENTOLIN  HFA) 108 (90 Base) MCG/ACT inhaler    Sig: Inhale 1-2 puffs into the lungs every 6 (six) hours as needed.    Dispense:  18 g    Refill:  2   lisinopril -hydrochlorothiazide  (ZESTORETIC ) 20-12.5 MG tablet    Sig: Take 1 tablet by mouth daily.    Dispense:  90 tablet    Refill:  3     Follow up: 2-4 weeks for nurse blood pressure check and lab-only visit    Should you have any questions or concerns please call the internal medicine clinic at [phone].    Bryden Darden, D.O. [facility]

## 2023-12-02 ENCOUNTER — Encounter: Payer: Self-pay | Admitting: Student

## 2023-12-02 ENCOUNTER — Ambulatory Visit: Payer: Self-pay | Admitting: Student

## 2023-12-02 DIAGNOSIS — Z419 Encounter for procedure for purposes other than remedying health state, unspecified: Secondary | ICD-10-CM | POA: Diagnosis not present

## 2023-12-02 DIAGNOSIS — Z01818 Encounter for other preprocedural examination: Secondary | ICD-10-CM | POA: Insufficient documentation

## 2023-12-02 LAB — BMP8+ANION GAP
Anion Gap: 18 mmol/L (ref 10.0–18.0)
BUN/Creatinine Ratio: 14 (ref 12–28)
BUN: 11 mg/dL (ref 8–27)
CO2: 21 mmol/L (ref 20–29)
Calcium: 10.2 mg/dL (ref 8.7–10.3)
Chloride: 104 mmol/L (ref 96–106)
Creatinine, Ser: 0.76 mg/dL (ref 0.57–1.00)
Glucose: 84 mg/dL (ref 70–99)
Potassium: 4.4 mmol/L (ref 3.5–5.2)
Sodium: 143 mmol/L (ref 134–144)
eGFR: 90 mL/min/{1.73_m2} (ref >59)

## 2023-12-02 LAB — CBC
Hematocrit: 44 % (ref 34.0–46.6)
Hemoglobin: 13.3 g/dL (ref 11.1–15.9)
MCH: 22.8 pg — ABNORMAL LOW (ref 26.6–33.0)
MCHC: 30.2 g/dL — ABNORMAL LOW (ref 31.5–35.7)
MCV: 76 fL — ABNORMAL LOW (ref 79–97)
Platelets: 275 10*3/uL (ref 150–450)
RBC: 5.83 x10E6/uL — ABNORMAL HIGH (ref 3.77–5.28)
RDW: 15.9 % — ABNORMAL HIGH (ref 11.7–15.4)
WBC: 6.4 10*3/uL (ref 3.4–10.8)

## 2023-12-02 LAB — IRON,TIBC AND FERRITIN PANEL
Ferritin: 116 ng/mL (ref 15–150)
Iron Saturation: 18 % (ref 15–55)
Iron: 46 ug/dL (ref 27–159)
Total Iron Binding Capacity: 258 ug/dL (ref 250–450)
UIBC: 212 ug/dL (ref 131–425)

## 2023-12-02 NOTE — Assessment & Plan Note (Signed)
 BMI 48.83. Tolerating Wegovy  0.25 mg weekly for past 1-2 months. Notable for some weight loss. Will titrate up as tolerated.   Plan -Increase Wegovy  to 0.5 mg weekly x 4 weeks, can titrate up further as tolerated  -Plans to hold 2 weeks before hysteroscopy (TBD)

## 2023-12-02 NOTE — Assessment & Plan Note (Signed)
 Refilled Symbicort  and PRN albuterol . No acute concerns and lung exam unremarkable today.

## 2023-12-02 NOTE — Assessment & Plan Note (Signed)
 Repeat BP remains elevated 165/105. Asymptomatic, denies chest pain, SOB, headache, n/v, blurry vision. Reports was taken off antihypertensive a few months ago since BP better and financial concerns. Given her BP remains elevated in past few OV, will restart lisinopril -hydrochlorothiazide  20-12.5 mg daily (states covered and should be able to afford copay this time). Last BMP 11/2022 was normal electrolytes and renal function.   Plan -Start lisinopril -hydrochlorothiazide  20-12.5 mg -F/u in 2-4 weeks with RN BP visit and lab-only visit  -Patient to record BP readings at home

## 2023-12-02 NOTE — Assessment & Plan Note (Signed)
 Presents for preoperative clearance for diagnostic hysteroscopy with possible D&C and polypectomy with GYN (Dr. Andrena Ke). Procedure date is TBD. RCRI = 0 3.9%. DASI = 18.95 points, 5.07 METs. Denies active chest pain, SOB, or acute symptoms. Patient is low risk for this procedure. Will hold Wegovy  2 weeks prior to surgery.   Plan -Complete clearance letter and fax to GYN office -Hold Wegovy  2 weeks prior to surgery  -Repeat BMP and CBC today

## 2023-12-03 ENCOUNTER — Telehealth: Payer: Self-pay | Admitting: *Deleted

## 2023-12-03 NOTE — Telephone Encounter (Signed)
 Copied from CRM [id number]. Topic: General - Other >> Dec 03, 2023 11:28 AM Blair Bumpers wrote: Reason for CRM: Patient states she was told that there's someone in the office that can help her with Medicare. Called [employer] & spoke with [place] and she states there is no one in office that helps with that but she would investigate. Advised patient that a message would be sent & addressed on Monday & someone would possibly contact her. CB #: [phone].

## 2023-12-07 ENCOUNTER — Encounter: Payer: Self-pay | Admitting: *Deleted

## 2023-12-08 ENCOUNTER — Other Ambulatory Visit (HOSPITAL_COMMUNITY): Payer: Self-pay

## 2023-12-09 NOTE — Progress Notes (Signed)
 Internal Medicine Clinic Attending  Case discussed with the resident at the time of the visit.  We reviewed the resident's history and exam and pertinent patient test results.  I agree with the assessment, diagnosis, and plan of care documented in the resident's note.

## 2023-12-14 ENCOUNTER — Ambulatory Visit: Payer: Self-pay | Admitting: Student

## 2023-12-31 ENCOUNTER — Encounter (HOSPITAL_COMMUNITY): Payer: Self-pay | Admitting: Obstetrics and Gynecology

## 2023-12-31 ENCOUNTER — Encounter: Payer: Self-pay | Admitting: Obstetrics and Gynecology

## 2023-12-31 ENCOUNTER — Ambulatory Visit (INDEPENDENT_AMBULATORY_CARE_PROVIDER_SITE_OTHER): Admitting: Obstetrics and Gynecology

## 2023-12-31 ENCOUNTER — Encounter: Payer: Self-pay | Admitting: *Deleted

## 2023-12-31 VITALS — BP 124/72 | HR 93 | Temp 98.8°F | Ht 62.25 in | Wt 263.0 lb

## 2023-12-31 DIAGNOSIS — N840 Polyp of corpus uteri: Secondary | ICD-10-CM

## 2023-12-31 DIAGNOSIS — N950 Postmenopausal bleeding: Secondary | ICD-10-CM | POA: Diagnosis not present

## 2023-12-31 DIAGNOSIS — R9389 Abnormal findings on diagnostic imaging of other specified body structures: Secondary | ICD-10-CM | POA: Diagnosis not present

## 2023-12-31 NOTE — Progress Notes (Signed)
 Spoke w/ via phone for pre-op interview--- Reena Lab needs dos---- BMP and EKG per anesthesia. Surgeon orders requested 12/31/23.        Lab results------ COVID test -----patient states asymptomatic no test needed Arrive at -------0700 NPO after MN NO Solid Food.   Pre-Surgery Ensure or G2:  Med rec completed Medications to take morning of surgery ----- Albuterol -bring. Symbicort  inhaler. Diabetic medication -----  GLP1 agonist last dose: Semaglutide  0.5mg  last dose 12/22/23. GLP1 instructions: Hold any future doses until after surgery, pt verbalized understanding.  Patient instructed no nail polish to be worn day of surgery Patient instructed to bring photo id and insurance card day of surgery Patient aware to have Driver (ride ) / caregiver    for 24 hours after surgery - Daughter Luba Laursen Patient Special Instructions ----- Shower with antibacterial instructions. Pre-Op special Instructions -----  Patient verbalized understanding of instructions that were given at this phone interview. Patient denies chest pain, sob, fever, cough at the interview.

## 2023-12-31 NOTE — H&P (View-Only) (Signed)
 58 y.o. [id number] female with asthma, HTN (no meds), HLD here for postmenopausal bleeding, thickened endometrium, and endometrial polyp for preoperative exam. Single.  Patient was initially evaluated by DOROTHA Bland, NP. 01/07/24 diagnostic hysteroscopy, dilation and curettage, polypectomy. Daughter will be present.  Patient's last menstrual period was 10/05/2016 (approximate).   On 09/22/2023 at her appointment with DAIVD, NP, it was noted: She presented 6 weeks ago with c/o PMB x 2 years. Had regular periods until early 2023. Since she has had occasional spotting, bright red, enough to wear a liner at times. Lasts 2-3 days. No pain. No history of fibroids or polyps.   12/31/23 TVUS:  Anteverted size and shape 10.30 x5.87 x4.59 cm 1 intramural and 1 pedunculated fibroid noted  Thickened endometrium 17.63mm with a 22x40mm area with a feeder vessel  Right ovary normal, seen, transabdominally Left ovary not seen Impression:Thickened endometrium with polyp, SHG recommended  10/20/23 TVUS/SIS:  Indications: Postmenopausal bleeding, endometrial thickening Findings:  Uterus: Anteverted uterus Endometrial thickness: 4.1 mm.  11 mm filling defect noted on SIS. No free fluid. Impression:  11mm  filling defect noted, likely endometrial polyp  10/20/23 pathology: A. ENDOMETRIUM, BIOPSY:  Fragmented benign proliferative phase endometrium  Benign endocervical epithelium  Negative for polyp, breakdown, atypia, hyperplasia and carcinoma   Today, she reports occasional spotting. No CP or SOB. Prior hospitalization for asthma ~30 years. No prior intubation. Last inhaler use 1 month ago.     Component Value Date/Time   DIAGPAP  06/21/2023 1605    - Negative for intraepithelial lesion or malignancy (NILM)   HPVHIGH Negative 06/21/2023 1605   ADEQPAP  06/21/2023 1605    Satisfactory for evaluation; transformation zone component PRESENT.   GYN HISTORY: Prior CS  OB History  Gravida Para  Term Preterm AB Living  5 2 2  3 2   SAB IAB Ectopic Multiple Live Births  2  1      # Outcome Date GA Lbr Len/2nd Weight Sex Type Anes PTL Lv  5 SAB           4 SAB           3 Ectopic           2 Term           1 Term             Past Medical History:  Diagnosis Date   Allergy    Anemia    Arthritis    knees    Asthma    GERD (gastroesophageal reflux disease)    occasionally    Neuromuscular disorder (HCC)    sciatica left leg   Obesity    Prediabetes     Past Surgical History:  Procedure Laterality Date   CESAREAN SECTION     x2   COLONOSCOPY  05/02/2018    Current Outpatient Medications on File Prior to Visit  Medication Sig Dispense Refill   albuterol  (VENTOLIN  HFA) 108 (90 Base) MCG/ACT inhaler Inhale 1-2 puffs into the lungs every 6 (six) hours as needed. 18 g 2   budesonide -formoterol  (SYMBICORT ) 80-4.5 MCG/ACT inhaler Inhale 2 puffs into the lungs 2 (two) times daily. 10.2 g 6   lisinopril -hydrochlorothiazide  (ZESTORETIC ) 20-12.5 MG tablet Take 1 tablet by mouth daily. 90 tablet 3   Loratadine 10 MG CAPS Take by mouth.     Multiple Vitamins-Minerals (CENTRUM SILVER 50+WOMEN PO) Take 1 each by mouth daily.     rosuvastatin  (CRESTOR ) 20 MG tablet Take 1  tablet (20 mg total) by mouth daily. 90 tablet 3   Semaglutide -Weight Management 0.5 MG/0.5ML SOAJ Inject 0.5 mg into the skin once a week. 2 mL 1   No current facility-administered medications on file prior to visit.    Allergies  Allergen Reactions   Penicillins     Freezes up my system- gets the shakes and feels cold      PE Today's Vitals   12/31/23 1017  BP: 124/72  Pulse: 93  Temp: 98.8 F (37.1 C)  TempSrc: Oral  SpO2: 97%  Weight: 263 lb (119.3 kg)  Height: 5' 2.25 (1.581 m)   Body mass index is 47.72 kg/m.  Physical Exam Vitals reviewed.  Constitutional:      General: She is not in acute distress.    Appearance: Normal appearance.  HENT:     Head: Normocephalic and atraumatic.      Nose: Nose normal.  Eyes:     Extraocular Movements: Extraocular movements intact.     Conjunctiva/sclera: Conjunctivae normal.  Cardiovascular:     Rate and Rhythm: Normal rate and regular rhythm.     Heart sounds: No murmur heard.    No friction rub. No gallop.  Pulmonary:     Effort: Pulmonary effort is normal. No respiratory distress.     Breath sounds: Normal breath sounds. No stridor. No wheezing, rhonchi or rales.  Musculoskeletal:        General: Normal range of motion.     Cervical back: Normal range of motion.  Neurological:     General: No focal deficit present.     Mental Status: She is alert.  Psychiatric:        Mood and Affect: Mood normal.        Behavior: Behavior normal.    Assessment and Plan:        PMB (postmenopausal bleeding) Endometrial polyp Thickened endometrium Assessment & Plan: Plan for hysteroscopy, dilation and curettage, polypectomy.  Discussed outpatient procedure. Reviewed that  recovery is usually 1-2 days. Risks including infections, bleeding, and damage to surrounding organs reviewed. Recommend NPO prior to midnight and reviewed medication to take on day of surgery. Dicussed use of NSAIDS as needed for pain postoperatively.  Preop checklist: Antibiotics: none DVT ppx: SCDs Postop visit: 1 week Additional clearance: 12/02/23 cleared by PCP Zheng, normal BMP and CBC, started on combined HTN med. BP normal today.   Vera LULLA Pa, MD

## 2023-12-31 NOTE — Patient Instructions (Signed)
 Preoperative instructions: Nothing to eat or drink after midnight, unless instructed differently regarding clear liquids by the anesthesia team at [facility]. Do not take any medications on the day of surgery, except those listed below: NONE Please follow all other instructions as provided by our surgical scheduler at [facility] and the anesthesia team at [facility].  Postoperative instructions: Take ibuprofen as prescribed and over-the-counter Tylenol as needed.

## 2023-12-31 NOTE — Progress Notes (Signed)
 58 y.o. [id number] female with asthma, HTN (no meds), HLD here for postmenopausal bleeding, thickened endometrium, and endometrial polyp for preoperative exam. Single.  Patient was initially evaluated by DOROTHA Bland, NP. 01/07/24 diagnostic hysteroscopy, dilation and curettage, polypectomy. Daughter will be present.  Patient's last menstrual period was 10/05/2016 (approximate).   On 09/22/2023 at her appointment with DAIVD, NP, it was noted: She presented 6 weeks ago with c/o PMB x 2 years. Had regular periods until early 2023. Since she has had occasional spotting, bright red, enough to wear a liner at times. Lasts 2-3 days. No pain. No history of fibroids or polyps.   12/31/23 TVUS:  Anteverted size and shape 10.30 x5.87 x4.59 cm 1 intramural and 1 pedunculated fibroid noted  Thickened endometrium 17.63mm with a 22x40mm area with a feeder vessel  Right ovary normal, seen, transabdominally Left ovary not seen Impression:Thickened endometrium with polyp, SHG recommended  10/20/23 TVUS/SIS:  Indications: Postmenopausal bleeding, endometrial thickening Findings:  Uterus: Anteverted uterus Endometrial thickness: 4.1 mm.  11 mm filling defect noted on SIS. No free fluid. Impression:  11mm  filling defect noted, likely endometrial polyp  10/20/23 pathology: A. ENDOMETRIUM, BIOPSY:  Fragmented benign proliferative phase endometrium  Benign endocervical epithelium  Negative for polyp, breakdown, atypia, hyperplasia and carcinoma   Today, she reports occasional spotting. No CP or SOB. Prior hospitalization for asthma ~30 years. No prior intubation. Last inhaler use 1 month ago.     Component Value Date/Time   DIAGPAP  06/21/2023 1605    - Negative for intraepithelial lesion or malignancy (NILM)   HPVHIGH Negative 06/21/2023 1605   ADEQPAP  06/21/2023 1605    Satisfactory for evaluation; transformation zone component PRESENT.   GYN HISTORY: Prior CS  OB History  Gravida Para  Term Preterm AB Living  5 2 2  3 2   SAB IAB Ectopic Multiple Live Births  2  1      # Outcome Date GA Lbr Len/2nd Weight Sex Type Anes PTL Lv  5 SAB           4 SAB           3 Ectopic           2 Term           1 Term             Past Medical History:  Diagnosis Date   Allergy    Anemia    Arthritis    knees    Asthma    GERD (gastroesophageal reflux disease)    occasionally    Neuromuscular disorder (HCC)    sciatica left leg   Obesity    Prediabetes     Past Surgical History:  Procedure Laterality Date   CESAREAN SECTION     x2   COLONOSCOPY  05/02/2018    Current Outpatient Medications on File Prior to Visit  Medication Sig Dispense Refill   albuterol  (VENTOLIN  HFA) 108 (90 Base) MCG/ACT inhaler Inhale 1-2 puffs into the lungs every 6 (six) hours as needed. 18 g 2   budesonide -formoterol  (SYMBICORT ) 80-4.5 MCG/ACT inhaler Inhale 2 puffs into the lungs 2 (two) times daily. 10.2 g 6   lisinopril -hydrochlorothiazide  (ZESTORETIC ) 20-12.5 MG tablet Take 1 tablet by mouth daily. 90 tablet 3   Loratadine 10 MG CAPS Take by mouth.     Multiple Vitamins-Minerals (CENTRUM SILVER 50+WOMEN PO) Take 1 each by mouth daily.     rosuvastatin  (CRESTOR ) 20 MG tablet Take 1  tablet (20 mg total) by mouth daily. 90 tablet 3   Semaglutide -Weight Management 0.5 MG/0.5ML SOAJ Inject 0.5 mg into the skin once a week. 2 mL 1   No current facility-administered medications on file prior to visit.    Allergies  Allergen Reactions   Penicillins     Freezes up my system- gets the shakes and feels cold      PE Today's Vitals   12/31/23 1017  BP: 124/72  Pulse: 93  Temp: 98.8 F (37.1 C)  TempSrc: Oral  SpO2: 97%  Weight: 263 lb (119.3 kg)  Height: 5' 2.25 (1.581 m)   Body mass index is 47.72 kg/m.  Physical Exam Vitals reviewed.  Constitutional:      General: She is not in acute distress.    Appearance: Normal appearance.  HENT:     Head: Normocephalic and atraumatic.      Nose: Nose normal.  Eyes:     Extraocular Movements: Extraocular movements intact.     Conjunctiva/sclera: Conjunctivae normal.  Cardiovascular:     Rate and Rhythm: Normal rate and regular rhythm.     Heart sounds: No murmur heard.    No friction rub. No gallop.  Pulmonary:     Effort: Pulmonary effort is normal. No respiratory distress.     Breath sounds: Normal breath sounds. No stridor. No wheezing, rhonchi or rales.  Musculoskeletal:        General: Normal range of motion.     Cervical back: Normal range of motion.  Neurological:     General: No focal deficit present.     Mental Status: She is alert.  Psychiatric:        Mood and Affect: Mood normal.        Behavior: Behavior normal.    Assessment and Plan:        PMB (postmenopausal bleeding) Endometrial polyp Thickened endometrium Assessment & Plan: Plan for hysteroscopy, dilation and curettage, polypectomy.  Discussed outpatient procedure. Reviewed that  recovery is usually 1-2 days. Risks including infections, bleeding, and damage to surrounding organs reviewed. Recommend NPO prior to midnight and reviewed medication to take on day of surgery. Dicussed use of NSAIDS as needed for pain postoperatively.  Preop checklist: Antibiotics: none DVT ppx: SCDs Postop visit: 1 week Additional clearance: 12/02/23 cleared by PCP Zheng, normal BMP and CBC, started on combined HTN med. BP normal today.   Vera LULLA Pa, MD

## 2023-12-31 NOTE — Assessment & Plan Note (Signed)
 Plan for hysteroscopy, dilation and curettage, polypectomy.  Discussed outpatient procedure. Reviewed that  recovery is usually 1-2 days. Risks including infections, bleeding, and damage to surrounding organs reviewed. Recommend NPO prior to midnight and reviewed medication to take on day of surgery. Dicussed use of NSAIDS as needed for pain postoperatively.  Preop checklist: Antibiotics: none DVT ppx: SCDs Postop visit: 1 week Additional clearance: 12/02/23 cleared by PCP Zheng, normal BMP and CBC, started on combined HTN med. BP normal today.

## 2024-01-07 ENCOUNTER — Encounter (HOSPITAL_COMMUNITY)
Admission: RE | Disposition: A | Discharge status: Home / Self Care | Payer: Self-pay | Source: Home / Self Care | Attending: Obstetrics and Gynecology

## 2024-01-07 ENCOUNTER — Other Ambulatory Visit: Payer: Self-pay

## 2024-01-07 ENCOUNTER — Ambulatory Visit (HOSPITAL_COMMUNITY)
Admission: RE | Admit: 2024-01-07 | Discharge: 2024-01-07 | Disposition: A | Discharge status: Home / Self Care | Payer: Medicare (Managed Care) | Attending: Obstetrics and Gynecology | Admitting: Obstetrics and Gynecology

## 2024-01-07 ENCOUNTER — Encounter (HOSPITAL_COMMUNITY): Payer: Self-pay | Admitting: Obstetrics and Gynecology

## 2024-01-07 ENCOUNTER — Ambulatory Visit (HOSPITAL_COMMUNITY): Payer: Medicare (Managed Care) | Admitting: Anesthesiology

## 2024-01-07 DIAGNOSIS — N840 Polyp of corpus uteri: Secondary | ICD-10-CM

## 2024-01-07 DIAGNOSIS — E785 Hyperlipidemia, unspecified: Secondary | ICD-10-CM | POA: Diagnosis not present

## 2024-01-07 DIAGNOSIS — I10 Essential (primary) hypertension: Secondary | ICD-10-CM

## 2024-01-07 DIAGNOSIS — N950 Postmenopausal bleeding: Secondary | ICD-10-CM

## 2024-01-07 DIAGNOSIS — Z6841 Body Mass Index (BMI) 40.0 and over, adult: Secondary | ICD-10-CM

## 2024-01-07 DIAGNOSIS — R9389 Abnormal findings on diagnostic imaging of other specified body structures: Secondary | ICD-10-CM | POA: Diagnosis not present

## 2024-01-07 DIAGNOSIS — Z01818 Encounter for other preprocedural examination: Secondary | ICD-10-CM

## 2024-01-07 HISTORY — PX: MYOSURE RESECTION: SHX7611

## 2024-01-07 HISTORY — PX: DILATATION & CURRETTAGE/HYSTEROSCOPY WITH RESECTOCOPE: SHX5572

## 2024-01-07 HISTORY — DX: Essential (primary) hypertension: I10

## 2024-01-07 LAB — BASIC METABOLIC PANEL WITH GFR
Anion gap: 12 (ref 5–15)
BUN: 11 mg/dL (ref 6–20)
CO2: 24 mmol/L (ref 22–32)
Calcium: 9.5 mg/dL (ref 8.9–10.3)
Chloride: 101 mmol/L (ref 98–111)
Creatinine, Ser: 0.75 mg/dL (ref 0.44–1.00)
GFR, Estimated: >60 mL/min (ref >60)
Glucose, Bld: 101 mg/dL — ABNORMAL HIGH (ref 70–99)
Potassium: 3.5 mmol/L (ref 3.5–5.1)
Sodium: 137 mmol/L (ref 135–145)

## 2024-01-07 SURGERY — DILATATION & CURETTAGE/HYSTEROSCOPY WITH RESECTOCOPE
Anesthesia: Monitor Anesthesia Care | Site: Uterus

## 2024-01-07 MED ORDER — LIDOCAINE HCL (PF) 1 % IJ SOLN
INTRAMUSCULAR | Status: AC
Start: 2024-01-07 — End: 2024-01-07
  Filled 2024-01-07: qty 30

## 2024-01-07 MED ORDER — ACETAMINOPHEN 500 MG PO TABS
1000.0000 mg | ORAL_TABLET | ORAL | Status: AC
  Administered 2024-01-07: 1000 mg via ORAL

## 2024-01-07 MED ORDER — CHLORHEXIDINE GLUCONATE 0.12 % MT SOLN
OROMUCOSAL | Status: AC
  Filled 2024-01-07: qty 15

## 2024-01-07 MED ORDER — SODIUM CHLORIDE 0.9 % IR SOLN
Status: DC | PRN
Start: 2024-01-07 — End: 2024-01-07
  Administered 2024-01-07: 3000 mL

## 2024-01-07 MED ORDER — ORAL CARE MOUTH RINSE: 15.0000 mL | Freq: Once | OROMUCOSAL | Status: AC

## 2024-01-07 MED ORDER — PROPOFOL 10 MG/ML IV BOLUS
INTRAVENOUS | Status: DC | PRN
  Administered 2024-01-07 (×2): 50 mg via INTRAVENOUS

## 2024-01-07 MED ORDER — LACTATED RINGERS IV SOLN: INTRAVENOUS | Status: DC

## 2024-01-07 MED ORDER — ACETAMINOPHEN 500 MG PO TABS
ORAL_TABLET | ORAL | Status: AC
  Filled 2024-01-07: qty 2

## 2024-01-07 MED ORDER — FENTANYL CITRATE (PF) 250 MCG/5ML IJ SOLN
INTRAMUSCULAR | Status: DC | PRN
  Administered 2024-01-07: 100 ug via INTRAVENOUS

## 2024-01-07 MED ORDER — CHLORHEXIDINE GLUCONATE 0.12 % MT SOLN
15.0000 mL | Freq: Once | OROMUCOSAL | Status: AC
  Administered 2024-01-07: 15 mL via OROMUCOSAL

## 2024-01-07 MED ORDER — LIDOCAINE 2% (20 MG/ML) 5 ML SYRINGE
INTRAMUSCULAR | Status: DC | PRN
  Administered 2024-01-07: 100 mg via INTRAVENOUS

## 2024-01-07 MED ORDER — LIDOCAINE HCL (PF) 1 % IJ SOLN
INTRAMUSCULAR | Status: DC | PRN
  Administered 2024-01-07: 10 mL

## 2024-01-07 MED ORDER — PROPOFOL 500 MG/50ML IV EMUL
INTRAVENOUS | Status: DC | PRN
  Administered 2024-01-07: 150 ug/kg/min via INTRAVENOUS

## 2024-01-07 MED ORDER — MIDAZOLAM HCL 2 MG/2ML IJ SOLN
INTRAMUSCULAR | Status: AC
  Filled 2024-01-07: qty 2

## 2024-01-07 MED ORDER — DEXMEDETOMIDINE HCL IN NACL 80 MCG/20ML IV SOLN
INTRAVENOUS | Status: DC | PRN
  Administered 2024-01-07: 8 ug via INTRAVENOUS

## 2024-01-07 MED ORDER — GLYCOPYRROLATE 0.2 MG/ML IJ SOLN
INTRAMUSCULAR | Status: DC | PRN
  Administered 2024-01-07: .2 mg via INTRAVENOUS

## 2024-01-07 MED ORDER — ONDANSETRON HCL 4 MG/2ML IJ SOLN
INTRAMUSCULAR | Status: DC | PRN
  Administered 2024-01-07: 4 mg via INTRAVENOUS

## 2024-01-07 MED ORDER — MIDAZOLAM HCL 2 MG/2ML IJ SOLN
INTRAMUSCULAR | Status: DC | PRN
  Administered 2024-01-07: 2 mg via INTRAVENOUS

## 2024-01-07 MED ORDER — PROPOFOL 10 MG/ML IV BOLUS
INTRAVENOUS | Status: AC
  Filled 2024-01-07: qty 20

## 2024-01-07 MED ORDER — FENTANYL CITRATE (PF) 250 MCG/5ML IJ SOLN
INTRAMUSCULAR | Status: AC
  Filled 2024-01-07: qty 5

## 2024-01-07 MED ORDER — DEXAMETHASONE SODIUM PHOSPHATE 10 MG/ML IJ SOLN
INTRAMUSCULAR | Status: DC | PRN
  Administered 2024-01-07: 10 mg via INTRAVENOUS

## 2024-01-07 SURGICAL SUPPLY — 18 items
COVER MAYO STAND STRL (DRAPES) ×1 IMPLANT
DEVICE MYOSURE LITE (MISCELLANEOUS) IMPLANT
DEVICE MYOSURE REACH (MISCELLANEOUS) IMPLANT
DILATOR CANAL MILEX (MISCELLANEOUS) IMPLANT
GAUZE 4X4 16PLY ~~LOC~~+RFID DBL (SPONGE) IMPLANT
GLOVE BIO SURGEON STRL SZ7 (GLOVE) ×1 IMPLANT
GLOVE BIOGEL PI IND STRL 7.0 (GLOVE) ×1 IMPLANT
GOWN STRL REUS W/ TWL XL LVL3 (GOWN DISPOSABLE) ×1 IMPLANT
HIBICLENS CHG 4% 4OZ BTL (MISCELLANEOUS) ×1 IMPLANT
KIT PROCEDURE FLUENT (KITS) ×1 IMPLANT
KIT TURNOVER KIT B (KITS) ×1 IMPLANT
PACK VAGINAL MINOR WOMEN LF (CUSTOM PROCEDURE TRAY) ×1 IMPLANT
PAD OB MATERNITY 11 LF (PERSONAL CARE ITEMS) ×1 IMPLANT
PAD PREP 24X48 CUFFED NSTRL (MISCELLANEOUS) ×1 IMPLANT
SEAL ROD LENS SCOPE MYOSURE (ABLATOR) ×1 IMPLANT
SOL .9 NS 3000ML IRR UROMATIC (IV SOLUTION) ×1 IMPLANT
SYSTEM TISS REMOVAL MYOSURE XL (MISCELLANEOUS) IMPLANT
TOWEL OR 17X24 6PK STRL BLUE (TOWEL DISPOSABLE) ×1 IMPLANT

## 2024-01-07 NOTE — Discharge Instr - Supplementary Instructions (Signed)
 May take Tylenol  after 1:40pm if needed for discomfort.

## 2024-01-07 NOTE — Interval H&P Note (Signed)
 History and Physical Interval Note:  01/07/2024 8:33 AM  Tammie Solis  has presented today for surgery, with the diagnosis of PMB, endometrial polyp.  The various methods of treatment have been discussed with the patient and family. After consideration of risks, benefits and other options for treatment, the patient has consented to  Procedure(s) with comments: DILATATION & CURETTAGE/HYSTEROSCOPY WITH RESECTOCOPE (N/A) - MYOSURE MYOSURE RESECTION (N/A) as a surgical intervention.  The patient's history has been reviewed, patient examined, no change in status, stable for surgery.  I have reviewed the patient's chart and labs.  Questions were answered to the patient's satisfaction.     Jaeshaun Riva LULLA Pa

## 2024-01-07 NOTE — Discharge Instructions (Signed)
  Post Anesthesia Home Care Instructions  Activity: Get plenty of rest for the remainder of the day. A responsible adult should stay with you for 24 hours following the procedure.  For the next 24 hours, DO NOT: -Drive a car -Advertising copywriter -Drink alcoholic beverages -Take any medication unless instructed by your physician -Make any legal decisions or sign important papers.  Meals: Start with liquid foods such as gelatin or soup. Progress to regular foods as tolerated. Avoid greasy, spicy, heavy foods. If nausea and/or vomiting occur, drink only clear liquids until the nausea and/or vomiting subsides. Call your physician if vomiting continues.  Special Instructions/Symptoms: Your throat may feel dry or sore from the anesthesia or the breathing tube placed in your throat during surgery. If this causes discomfort, gargle with warm salt water. The discomfort should disappear within 24 hours. nd/or vomiting:

## 2024-01-07 NOTE — Transfer of Care (Addendum)
 Immediate Anesthesia Transfer of Care Note  Patient: Tammie Solis  Procedure(s) Performed: DILATATION & CURETTAGE/HYSTEROSCOPY WITH RESECTOCOPE (Uterus) MYOSURE RESECTION (Uterus)  Patient Location: PACU  Anesthesia Type:MAC  Level of Consciousness: awake, alert , and oriented  Airway & Oxygen Therapy: Patient Spontanous Breathing and Patient connected to face mask oxygen  Post-op Assessment: Report given to RN and Post -op Vital signs reviewed and stable  Post vital signs: Reviewed and stable  Last Vitals:  Vitals Value Taken Time  BP 100/49 01/07/24 09:46  Temp    Pulse 77 01/07/24 09:48  Resp 18 01/07/24 09:48  SpO2 99 % 01/07/24 09:48  Vitals shown include unfiled device data.  Last Pain:  Vitals:   01/07/24 0735  TempSrc: Oral  PainSc: 0-No pain      Patients Stated Pain Goal: 6 (01/07/24 0735)  Complications: No notable events documented.

## 2024-01-07 NOTE — Op Note (Signed)
 01/07/24 Tammie Solis [id number]  OPERATIVE REPORT  Preop Diagnosis: Pre-Op Diagnosis Codes:     * PMB (postmenopausal bleeding) [N95.0]    * Endometrial polyp [N84.0]   Post operative diagnosis: same  Procedure: Procedure(s): DILATATION & CURETTAGE/HYSTEROSCOPY WITH RESECTOCOPE MYOSURE RESECTION   Surgeon: Vera LULLA Pa, MD Assistant: none  Anesthesia: MAC Fluids: please see anesthesia report Fluid deficit: 90cc  Complications: None  Findings: Normal cervix and uterine cavity measuring 9cm with both ostia seen. Fundal polypoid tissue and lateral polyp noted.  Estimated blood loss: Minimal  Specimens:  ID Type Source Tests Collected by Time Destination  1 : endometrial polyp Tissue PATH Gyn biopsy SURGICAL PATHOLOGY Pa Vera LULLA, MD 01/07/2024 0932   2 : endometrial currettings Tissue PATH Gyn biopsy SURGICAL PATHOLOGY Pa Vera LULLA, MD 01/07/2024 0932     Disposition of specimen: Pathology    Procedure: Patient was taken to the OR where she was placed in dorsal lithotomy in stirrups. She voided prior to transport. SCDs were in place.  The patient was prepped and draped in the usual sterile fashion. An adequate timeout was obtained and everyone agreed. A bivalve speculum was placed inside the vagina and the cervix visualized. The cervix was grasped anteriorly with a single-tooth tenaculum. Paracervical block was performed with 1% lidocaine .  The uterus sounded to 9 cm. Sequential cervical dilation was done to 24fr, and the myosure hysteroscope was introduced into the uterine cavity. The cervix and endometrial lining appeared normal both ostia were seen. Findings as noted. A small polyp was seen and removed.  The myosure was also used to sample the entire cavity.  A sharp curettage was then performed to ensure complete sampling of the cavity and was sent to pathology. All instrument, sponge and lap counts were correct x2. The patient was awakened taken to recovery room in  stable condition.  Vera LULLA Pa, MD 01/07/24  9:39 AM

## 2024-01-10 ENCOUNTER — Encounter (HOSPITAL_COMMUNITY): Payer: Self-pay | Admitting: Obstetrics and Gynecology

## 2024-01-10 LAB — SURGICAL PATHOLOGY

## 2024-01-11 ENCOUNTER — Ambulatory Visit: Payer: Self-pay | Admitting: Obstetrics and Gynecology

## 2024-01-11 ENCOUNTER — Encounter (HOSPITAL_COMMUNITY): Payer: Self-pay | Admitting: Obstetrics and Gynecology

## 2024-01-14 NOTE — Anesthesia Preprocedure Evaluation (Signed)
 Anesthesia Evaluation  Patient identified by MRN, date of birth, ID band Patient awake    Reviewed: Allergy & Precautions, NPO status , Patient's Chart, lab work & pertinent test results  History of Anesthesia Complications Negative for: history of anesthetic complications  Airway Mallampati: III  TM Distance: >3 FB Neck ROM: Full    Dental  (+) Dental Advisory Given   Pulmonary neg shortness of breath, asthma , neg sleep apnea, neg recent URI   breath sounds clear to auscultation       Cardiovascular hypertension, Pt. on medications (-) angina (-) Past MI and (-) CHF  Rhythm:Regular     Neuro/Psych  Headaches  Neuromuscular disease    GI/Hepatic Neg liver ROS,GERD  Controlled,,  Endo/Other  negative endocrine ROS    Renal/GU negative Renal ROS     Musculoskeletal  (+) Arthritis ,    Abdominal   Peds  Hematology  (+) Blood dyscrasia, anemia   Anesthesia Other Findings   Reproductive/Obstetrics                              Anesthesia Physical Anesthesia Plan  ASA: 2  Anesthesia Plan: MAC   Post-op Pain Management:    Induction: Intravenous  PONV Risk Score and Plan: 2 and Propofol  infusion and Treatment may vary due to age or medical condition  Airway Management Planned: Nasal Cannula, Natural Airway and Simple Face Mask  Additional Equipment: None  Intra-op Plan:   Post-operative Plan:   Informed Consent: I have reviewed the patients History and Physical, chart, labs and discussed the procedure including the risks, benefits and alternatives for the proposed anesthesia with the patient or authorized representative who has indicated his/her understanding and acceptance.     Dental advisory given  Plan Discussed with: CRNA  Anesthesia Plan Comments:          Anesthesia Quick Evaluation

## 2024-01-14 NOTE — Anesthesia Postprocedure Evaluation (Signed)
 Anesthesia Post Note  Patient: Tammie Solis  Procedure(s) Performed: DILATATION & CURETTAGE/HYSTEROSCOPY WITH RESECTOCOPE (Uterus) MYOSURE RESECTION (Uterus)     Patient location during evaluation: PACU Anesthesia Type: MAC Level of consciousness: awake and alert Pain management: pain level controlled Vital Signs Assessment: post-procedure vital signs reviewed and stable Respiratory status: spontaneous breathing, nonlabored ventilation and respiratory function stable Cardiovascular status: stable and blood pressure returned to baseline Postop Assessment: no apparent nausea or vomiting Anesthetic complications: no   No notable events documented.  Last Vitals:  Vitals:   01/07/24 1025 01/07/24 1031  BP:  (!) 116/47  Pulse: 69 70  Resp: (!) 22 14  Temp: 36.4 C   SpO2: 98% 97%    Last Pain:  Vitals:   01/07/24 0735  TempSrc: Oral  PainSc: 0-No pain                 [name]

## 2024-01-17 ENCOUNTER — Other Ambulatory Visit (HOSPITAL_COMMUNITY): Payer: Self-pay

## 2024-01-18 ENCOUNTER — Other Ambulatory Visit (HOSPITAL_COMMUNITY): Payer: Self-pay

## 2024-01-18 ENCOUNTER — Telehealth (HOSPITAL_COMMUNITY): Payer: Self-pay

## 2024-01-19 ENCOUNTER — Other Ambulatory Visit (HOSPITAL_COMMUNITY): Payer: Self-pay

## 2024-01-19 ENCOUNTER — Telehealth (HOSPITAL_COMMUNITY): Payer: Self-pay

## 2024-01-19 NOTE — Telephone Encounter (Signed)
 Pharmacy Patient Advocate Encounter   Received notification from Pt Calls Messages that prior authorization for Wegovy   0.5mg / 0.5 ml pen is required/requested.   Insurance verification completed.   The patient is insured through [employer] .   Per test claim: Product not on formulary

## 2024-01-19 NOTE — Telephone Encounter (Signed)
 PA request has been Received. New Encounter has been or will be created for follow up. For additional info see Pharmacy Prior Auth telephone encounter from 01/19/24.

## 2024-01-21 ENCOUNTER — Other Ambulatory Visit (HOSPITAL_COMMUNITY): Payer: Self-pay

## 2024-01-24 ENCOUNTER — Encounter: Admitting: Obstetrics and Gynecology

## 2024-01-26 ENCOUNTER — Other Ambulatory Visit: Payer: Self-pay

## 2024-01-26 ENCOUNTER — Other Ambulatory Visit (HOSPITAL_COMMUNITY): Payer: Self-pay

## 2024-01-27 ENCOUNTER — Other Ambulatory Visit (HOSPITAL_COMMUNITY): Payer: Self-pay

## 2024-02-01 ENCOUNTER — Other Ambulatory Visit (HOSPITAL_COMMUNITY): Payer: Self-pay

## 2024-02-09 ENCOUNTER — Encounter: Payer: Self-pay | Admitting: Obstetrics and Gynecology

## 2024-02-09 ENCOUNTER — Other Ambulatory Visit (HOSPITAL_COMMUNITY): Payer: Self-pay

## 2024-02-09 ENCOUNTER — Ambulatory Visit (INDEPENDENT_AMBULATORY_CARE_PROVIDER_SITE_OTHER): Admitting: Obstetrics and Gynecology

## 2024-02-09 VITALS — BP 126/64 | HR 90 | Temp 99.0°F | Wt 265.0 lb

## 2024-02-09 DIAGNOSIS — N8500 Endometrial hyperplasia, unspecified: Secondary | ICD-10-CM | POA: Insufficient documentation

## 2024-02-09 DIAGNOSIS — Z09 Encounter for follow-up examination after completed treatment for conditions other than malignant neoplasm: Secondary | ICD-10-CM

## 2024-02-09 MED ORDER — MEGESTROL ACETATE 40 MG PO TABS
40.0000 mg | ORAL_TABLET | Freq: Every day | ORAL | 3 refills | Status: DC
  Filled 2024-02-09: qty 90, 90d supply, fill #0

## 2024-02-09 NOTE — Progress Notes (Signed)
 58 y.o. [id number] postmenopausal female with PMB/endometrial thickness/endometrial polyp, endometrial hyperplasia (diagnosed on 01/07/24  hysteroscopy), asthma, HTN (no meds), HLD here for 4-week postop exam - diagnostic hysteroscopy, dilation and curettage, polypectomy. Single.   Patient was initially evaluated by DOROTHA Bland, NP.  Patient's last menstrual period was 10/05/2016 (approximate).   Pt states she is doing well. Denies N/V, abdominal pain, VB, dysuria. Normal BM and voids.      Component Value Date/Time   DIAGPAP  06/21/2023 1605    - Negative for intraepithelial lesion or malignancy (NILM)   HPVHIGH Negative 06/21/2023 1605   ADEQPAP  06/21/2023 1605    Satisfactory for evaluation; transformation zone component PRESENT.   10/20/23 TVUS/SIS:  Indications: Postmenopausal bleeding, endometrial thickening Findings:  Uterus: Anteverted uterus Endometrial thickness: 4.1 mm.  11 mm filling defect noted on SIS. No free fluid. Impression:  11mm  filling defect noted, likely endometrial polyp  10/20/23 pathology: A. ENDOMETRIUM, BIOPSY:  Fragmented benign proliferative phase endometrium  Benign endocervical epithelium  Negative for polyp, breakdown, atypia, hyperplasia and carcinoma   01/07/24 pathology: . ENDOMETRIUM, POLYPECTOMY:  - Endometrial polyp with endometrial hyperplasia.  - Negative for atypia/EIN and malignancy.   B. ENDOMETRIUM, CURETTAGE:  - Fragments of poorly preserved proliferative endometrium.  - Negative for atypia/EIN and malignancy.   GYN HISTORY: Prior CS  OB History  Gravida Para Term Preterm AB Living  5 2 2  3 2   SAB IAB Ectopic Multiple Live Births  2  1  2     # Outcome Date GA Lbr Len/2nd Weight Sex Type Anes PTL Lv  5 SAB           4 SAB           3 Ectopic           2 Term      CS-Unspec   LIV  1 Term      CS-Unspec   LIV    Past Medical History:  Diagnosis Date   Allergy    Anemia    Arthritis    knees    Asthma     GERD (gastroesophageal reflux disease)    occasionally    Hypertension    Neuromuscular disorder (HCC)    sciatica left leg   Obesity    Prediabetes     Past Surgical History:  Procedure Laterality Date   CESAREAN SECTION     x2   COLONOSCOPY  05/02/2018   DILATATION & CURRETTAGE/HYSTEROSCOPY WITH RESECTOCOPE N/A 01/07/2024   Procedure: DILATATION & CURETTAGE/HYSTEROSCOPY WITH RESECTOCOPE;  Surgeon: Dallie Vera GAILS, MD;  Location: [facility] OR;  Service: Gynecology;  Laterality: N/AMERL MELINDA MELINDA RESECTION N/A 01/07/2024   Procedure: MELINDA RESECTION;  Surgeon: Dallie Vera GAILS, MD;  Location: [facility] OR;  Service: Gynecology;  Laterality: N/A;    Current Outpatient Medications on File Prior to Visit  Medication Sig Dispense Refill   albuterol  (VENTOLIN  HFA) 108 (90 Base) MCG/ACT inhaler Inhale 1-2 puffs into the lungs every 6 (six) hours as needed. 18 g 2   budesonide -formoterol  (SYMBICORT ) 80-4.5 MCG/ACT inhaler Inhale 2 puffs into the lungs 2 (two) times daily. 10.2 g 6   lisinopril -hydrochlorothiazide  (ZESTORETIC ) 20-12.5 MG tablet Take 1 tablet by mouth daily. 90 tablet 3   Loratadine 10 MG CAPS Take by mouth.     Multiple Vitamins-Minerals (CENTRUM SILVER 50+WOMEN PO) Take 1 each by mouth daily.     rosuvastatin  (CRESTOR ) 20 MG tablet Take 1 tablet (20  mg total) by mouth daily. 90 tablet 3   semaglutide -weight management (WEGOVY ) 0.5 MG/0.5ML SOAJ SQ injection Inject 0.5 mg into the skin once a week. (Patient not taking: Reported on 02/09/2024) 2 mL 1   No current facility-administered medications on file prior to visit.    Allergies  Allergen Reactions   Penicillins     Freezes up my system- gets the shakes and feels cold      PE Today's Vitals   02/09/24 1130  BP: 126/64  Pulse: 90  Temp: 99 F (37.2 C)  TempSrc: Oral  SpO2: 98%  Weight: 265 lb (120.2 kg)    Body mass index is 48.08 kg/m.  Physical Exam Vitals reviewed.  Constitutional:      General: She is  not in acute distress.    Appearance: Normal appearance.  HENT:     Head: Normocephalic and atraumatic.     Nose: Nose normal.  Eyes:     Extraocular Movements: Extraocular movements intact.     Conjunctiva/sclera: Conjunctivae normal.  Pulmonary:     Effort: Pulmonary effort is normal.  Abdominal:     Tenderness: There is no abdominal tenderness.  Musculoskeletal:        General: Normal range of motion.     Cervical back: Normal range of motion.  Neurological:     General: No focal deficit present.     Mental Status: She is alert.  Psychiatric:        Mood and Affect: Mood normal.        Behavior: Behavior normal.    Assessment and Plan:        Endometrial hyperplasia Assessment & Plan: Discussed increased risk for atypia and uterine cancer based on pathology, menopause, obesity. Reviewed medical vs surgical management Desires medical management. Start with megace  40mg  at bedtime. Plan for EMB in 3-6 month during year 1 of surveillance All questions answered.   Orders: -     Endometrial biopsy; Future -     Megestrol  Acetate; Take 1 tablet (40 mg total) by mouth at bedtime.  Dispense: 90 tablet; Refill: 3  Postop check  Normal postop exam.  Pathology reviewed with patient. Cleared to return to work. All questions answered.    Vera LULLA Pa, MD

## 2024-02-09 NOTE — Assessment & Plan Note (Addendum)
 Discussed increased risk for atypia and uterine cancer based on pathology, menopause, obesity. Reviewed medical vs surgical management Desires medical management. Start with megace  40mg  at bedtime. Plan for EMB in 3-6 month during year 1 of surveillance All questions answered.

## 2024-02-14 ENCOUNTER — Other Ambulatory Visit (HOSPITAL_COMMUNITY): Payer: Self-pay

## 2024-02-14 ENCOUNTER — Telehealth: Payer: Self-pay | Admitting: *Deleted

## 2024-02-14 NOTE — Telephone Encounter (Signed)
 Patient her questions about her Wegovy .  Wegovy  was covered by her previous insurance but not by her current insurance.  Call to [facility] to verify.  Patient has a Medicaid Part D type of Insurance which does not cover a lot of weight loss medications.  May cover Phentermine pharmacist was not sure.  Patient will await call to see what she can be changed to.  Wants prescription to go to the [facility] on [address].

## 2024-02-16 NOTE — Telephone Encounter (Signed)
 Wegovy  is not covered as patient's insurance does not cover weight loss meds.   Pharmacy was just stsing a medication that may be covered.  Zepbound is covered only if used for sleep apnea which I do not see listed as a problem for patient.

## 2024-02-17 NOTE — Telephone Encounter (Signed)
 RTC to patient message was left that her insurance does not cover weight loss medications.  There are no patient Assistance programs available for her either.

## 2024-03-14 ENCOUNTER — Ambulatory Visit (INDEPENDENT_AMBULATORY_CARE_PROVIDER_SITE_OTHER): Payer: Medicare (Managed Care) | Admitting: Student

## 2024-03-14 ENCOUNTER — Encounter: Payer: Self-pay | Admitting: Student

## 2024-03-14 VITALS — BP 137/83 | HR 87 | Temp 99.2°F | Ht 62.25 in | Wt 265.4 lb

## 2024-03-14 DIAGNOSIS — Z23 Encounter for immunization: Secondary | ICD-10-CM | POA: Diagnosis not present

## 2024-03-14 DIAGNOSIS — G629 Polyneuropathy, unspecified: Secondary | ICD-10-CM | POA: Diagnosis not present

## 2024-03-14 DIAGNOSIS — I10 Essential (primary) hypertension: Secondary | ICD-10-CM

## 2024-03-14 DIAGNOSIS — R7303 Prediabetes: Secondary | ICD-10-CM

## 2024-03-14 DIAGNOSIS — H00011 Hordeolum externum right upper eyelid: Secondary | ICD-10-CM

## 2024-03-14 DIAGNOSIS — E041 Nontoxic single thyroid nodule: Secondary | ICD-10-CM

## 2024-03-14 NOTE — Progress Notes (Signed)
 CC: follow up visit  HPI: [name] is a 58 y.o. female living with a history stated below and presents today for follow up visit. Please see problem based assessment and plan for additional details.  Past Medical History:  Diagnosis Date   Allergy    Anemia    Arthritis    knees    Asthma    GERD (gastroesophageal reflux disease)    occasionally    Hypertension    Neuromuscular disorder (HCC)    sciatica left leg   Obesity    Prediabetes     Current Outpatient Medications on File Prior to Visit  Medication Sig Dispense Refill   albuterol  (VENTOLIN  HFA) 108 (90 Base) MCG/ACT inhaler Inhale 1-2 puffs into the lungs every 6 (six) hours as needed. 18 g 2   budesonide -formoterol  (SYMBICORT ) 80-4.5 MCG/ACT inhaler Inhale 2 puffs into the lungs 2 (two) times daily. 10.2 g 6   lisinopril -hydrochlorothiazide  (ZESTORETIC ) 20-12.5 MG tablet Take 1 tablet by mouth daily. 90 tablet 3   Loratadine 10 MG CAPS Take by mouth.     megestrol  (MEGACE ) 40 MG tablet Take 1 tablet (40 mg total) by mouth at bedtime. 90 tablet 3   Multiple Vitamins-Minerals (CENTRUM SILVER 50+WOMEN PO) Take 1 each by mouth daily.     rosuvastatin  (CRESTOR ) 20 MG tablet Take 1 tablet (20 mg total) by mouth daily. 90 tablet 3   semaglutide -weight management (WEGOVY ) 0.5 MG/0.5ML SOAJ SQ injection Inject 0.5 mg into the skin once a week. (Patient not taking: Reported on 02/09/2024) 2 mL 1   No current facility-administered medications on file prior to visit.    Family History  Problem Relation Age of Onset   Diabetes Mother    Kidney failure Mother    Cancer Father        prostate   Prostate cancer Father    Colon cancer Neg Hx    Colon polyps Neg Hx    Esophageal cancer Neg Hx    Rectal cancer Neg Hx    Stomach cancer Neg Hx    Pancreatic cancer Neg Hx     Social History   Socioeconomic History   Marital status: Single    Spouse name: Not on file   Number of children: Not on file   Years of  education: Not on file   Highest education level: Not on file  Occupational History   Not on file  Tobacco Use   Smoking status: Never    Passive exposure: Never   Smokeless tobacco: Never  Vaping Use   Vaping status: Never Used  Substance and Sexual Activity   Alcohol use: No   Drug use: No   Sexual activity: Not Currently    Partners: Male    Birth control/protection: Abstinence, Post-menopausal    Comment: menarche 58yo, sexual debut 58yo  Other Topics Concern   Not on file  Social History Narrative   Not on file   Social Drivers of Health   Financial Resource Strain: Low Risk  (03/14/2024)   Overall Financial Resource Strain (CARDIA)    Difficulty of Paying Living Expenses: Not very hard  Food Insecurity: No Food Insecurity (03/14/2024)   Hunger Vital Sign    Worried About Running Out of Food in the Last Year: Never true    Ran Out of Food in the Last Year: Never true  Transportation Needs: Unmet Transportation Needs (03/14/2024)   PRAPARE - Administrator, Civil Service (Medical): Yes  Lack of Transportation (Non-Medical): No  Physical Activity: Sufficiently Active (03/14/2024)   Exercise Vital Sign    Days of Exercise per Week: 7 days    Minutes of Exercise per Session: 30 min  Stress: No Stress Concern Present (03/14/2024)   [employer] of Occupational Health - Occupational Stress Questionnaire    Feeling of Stress: Not at all  Social Connections: Socially Isolated (03/14/2024)   Social Connection and Isolation Panel    Frequency of Communication with Friends and Family: More than three times a week    Frequency of Social Gatherings with Friends and Family: More than three times a week    Attends Religious Services: Never    Database administrator or Organizations: No    Attends Banker Meetings: Never    Marital Status: Never married  Intimate Partner Violence: Not At Risk (03/14/2024)   Humiliation, Afraid, Rape, and Kick  questionnaire    Fear of Current or Ex-Partner: No    Emotionally Abused: No    Physically Abused: No    Sexually Abused: No    Review of Systems: ROS negative except for what is noted on the assessment and plan.  Vitals:   03/14/24 0941 03/14/24 1013  BP: (!) 144/72 137/83  Pulse: 91 87  Temp: 99.2 F (37.3 C)   TempSrc: Oral   SpO2: 96%   Weight: 265 lb 6.4 oz (120.4 kg)   Height: 5' 2.25 (1.581 m)    Physical Exam: Constitutional: alert, sitting up in chair, in no acute distress Eyes: R upper eyelid stye w/o signs of infection or drainage, non-tender Cardiovascular: regular rate and rhythm Pulmonary/Chest: normal work of breathing on room air MSK: monofilament testing of feet with certain points of decreased sensation of b/l plantar surface Neurological: alert & oriented x 3,  Skin: warm and dry  Assessment & Plan:   Assessment & Plan Primary hypertension Status: stable. BP today 137/83 on repeat. Reports taking lisinopril  HCTZ 20-12.5 mg. Did NOT take medication this AM.  Last BMP in 12/2023 was normal.   Plan -Continue: lisinopril -hydrochlorothiazide  (advised patient to take before next OV) -BMP at future visit (~06/2024) Morbid obesity (HCC) Body mass index is 48.15 kg/m. No longer on Wegovy  due to coverage. Weight fairly unchanged at 265 lbs. Walk everyday with grandson. Will check with pharmacy about coverage with Medicaid or check about Zepbound. Not good candidate for phentermine given HTN. Will repeat A1c today.  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 Able to sensate foot and toes. Ambulating w/o difficulty. States had monofilament test by [facility] RN. Patient only reports plantar pain with prolonged standing. No other tingling, pins or needles or calf pain. Not affecting her daily activities.   Plan -Check TSH and B12 again -Monitor symptoms  -Provided info to obtain properly fitted shoes  Prediabetes Last A1c 5.6 in 05/2023. Repeat A1c today.   Hordeolum externum of right upper eyelid Noted R upper eyelid stye. No signs of infection. States no pain or TTP. No eye pain or vision changes. Continue warm compresses.  Encounter for immunization Received flu and PCV 20 vaccination today.    Return in about 5 months (around 08/14/2024) for routine visit with Dr. Drayden Lukas.   Patient discussed with Dr. Machen  Keyston Ardolino, D.O. [facility] Internal Medicine, PGY-3 Phone: [phone] Date 03/14/2024 Time 6:01 PM

## 2024-03-14 NOTE — Assessment & Plan Note (Addendum)
 Received flu and PCV 20 vaccination today.

## 2024-03-14 NOTE — Patient Instructions (Addendum)
 Thank you, Tammie Solis for allowing us  to provide your care today. Today we discussed:  -Make sure to take your blood pressure medicine (lisinopril -hydrochlorothiazide ) before office visit next time -Will do blood work today -Glad you got your flu and pneumonia shot  -The stye on your upper right eyelid will improve, continue with warm compresses   Follow up: 3-6 months    Should you have any questions or concerns please call the internal medicine clinic at [phone].    [name], D.O. [facility]

## 2024-03-14 NOTE — Assessment & Plan Note (Signed)
 Status: stable. BP today 137/83 on repeat. Reports taking lisinopril  HCTZ 20-12.5 mg. Did NOT take medication this AM.  Last BMP in 12/2023 was normal.   Plan -Continue: lisinopril -hydrochlorothiazide  (advised patient to take before next OV) -BMP at future visit (~06/2024)

## 2024-03-14 NOTE — Assessment & Plan Note (Signed)
 Last A1c 5.6 in 05/2023. Repeat A1c today.

## 2024-03-14 NOTE — Assessment & Plan Note (Signed)
 Body mass index is 48.15 kg/m. No longer on Wegovy  due to coverage. Weight fairly unchanged at 265 lbs. Walk everyday with grandson. Will check with pharmacy about coverage with Medicaid or check about Zepbound. Not good candidate for phentermine given HTN. Will repeat A1c today.

## 2024-03-14 NOTE — Assessment & Plan Note (Signed)
 Reports hx of this for both feet. Able to sensate foot and toes. Ambulating w/o difficulty. States had monofilament test by [facility] RN. Patient only reports plantar pain with prolonged standing. No other tingling, pins or needles or calf pain. Not affecting her daily activities.   Plan -Check TSH and B12 again -Monitor symptoms  -Provided info to obtain properly fitted shoes

## 2024-03-15 ENCOUNTER — Other Ambulatory Visit (HOSPITAL_COMMUNITY): Payer: Self-pay

## 2024-03-15 ENCOUNTER — Ambulatory Visit: Payer: Self-pay | Admitting: Student

## 2024-03-15 LAB — HEMOGLOBIN A1C
Est. average glucose Bld gHb Est-mCnc: 123 mg/dL
Hgb A1c MFr Bld: 5.9 % — ABNORMAL HIGH (ref 4.8–5.6)

## 2024-03-15 LAB — TSH: TSH: 1.54 u[IU]/mL (ref 0.450–4.500)

## 2024-03-15 LAB — VITAMIN B12: Vitamin B-12: 552 pg/mL (ref 232–1245)

## 2024-03-15 NOTE — Progress Notes (Signed)
 Internal Medicine Clinic Attending  Case discussed with the resident at the time of the visit.  We reviewed the resident's history and exam and pertinent patient test results.  I agree with the assessment, diagnosis, and plan of care documented in the resident's note.

## 2024-03-22 DIAGNOSIS — H00011 Hordeolum externum right upper eyelid: Secondary | ICD-10-CM | POA: Insufficient documentation

## 2024-03-22 NOTE — Assessment & Plan Note (Signed)
 Noted R upper eyelid stye. No signs of infection. States no pain or TTP. No eye pain or vision changes. Continue warm compresses.

## 2024-04-10 ENCOUNTER — Encounter: Payer: Self-pay | Admitting: Obstetrics and Gynecology

## 2024-04-10 ENCOUNTER — Other Ambulatory Visit (HOSPITAL_COMMUNITY): Payer: Self-pay

## 2024-04-10 ENCOUNTER — Other Ambulatory Visit (HOSPITAL_COMMUNITY)
Admission: RE | Admit: 2024-04-10 | Discharge: 2024-04-10 | Disposition: A | Discharge status: Home / Self Care | Payer: Medicare (Managed Care) | Source: Ambulatory Visit | Attending: Obstetrics and Gynecology | Admitting: Obstetrics and Gynecology

## 2024-04-10 ENCOUNTER — Ambulatory Visit: Payer: Self-pay | Admitting: Obstetrics and Gynecology

## 2024-04-10 ENCOUNTER — Ambulatory Visit (INDEPENDENT_AMBULATORY_CARE_PROVIDER_SITE_OTHER): Payer: Medicare (Managed Care) | Admitting: Obstetrics and Gynecology

## 2024-04-10 VITALS — BP 122/76 | HR 85 | Temp 98.4°F | Wt 265.0 lb

## 2024-04-10 DIAGNOSIS — N8500 Endometrial hyperplasia, unspecified: Secondary | ICD-10-CM | POA: Diagnosis not present

## 2024-04-10 DIAGNOSIS — N898 Other specified noninflammatory disorders of vagina: Secondary | ICD-10-CM

## 2024-04-10 DIAGNOSIS — Z01812 Encounter for preprocedural laboratory examination: Secondary | ICD-10-CM

## 2024-04-10 DIAGNOSIS — B9689 Other specified bacterial agents as the cause of diseases classified elsewhere: Secondary | ICD-10-CM

## 2024-04-10 LAB — WET PREP FOR TRICH, YEAST, CLUE

## 2024-04-10 LAB — PREGNANCY, URINE: Preg Test, Ur: NEGATIVE

## 2024-04-10 MED ORDER — MEGESTROL ACETATE 40 MG PO TABS
40.0000 mg | ORAL_TABLET | Freq: Two times a day (BID) | ORAL | 3 refills | Status: DC
  Filled 2024-04-10: qty 180, 90d supply, fill #0

## 2024-04-10 MED ORDER — METRONIDAZOLE 500 MG PO TABS
500.0000 mg | ORAL_TABLET | Freq: Two times a day (BID) | ORAL | 0 refills | Status: AC
  Filled 2024-04-10: qty 14, 7d supply, fill #0

## 2024-04-10 NOTE — Assessment & Plan Note (Signed)
 Patient noting spotting despite consistent Megace  use Increase megace : 40mg  twice daily. Plan for EMB in 3-6 month during year 1 of surveillance Return office in 3 months for EMB. To call if bleeding persists.

## 2024-04-10 NOTE — Progress Notes (Signed)
 58 y.o. [id number] postmenopausal female with PMB/endometrial thickness/endometrial polyp, endometrial hyperplasia (diagnosed on 01/07/24  hysteroscopy), asthma, HTN (no meds), HLD here for surveillance with EMB. Single.   Patient was initially evaluated by DOROTHA Bland, NP.  Patient's last menstrual period was 10/05/2016 (approximate).   She is on megace . She reports some spotting every day for the past week. She also notes a vaginal odor.     Component Value Date/Time   DIAGPAP  06/21/2023 1605    - Negative for intraepithelial lesion or malignancy (NILM)   HPVHIGH Negative 06/21/2023 1605   ADEQPAP  06/21/2023 1605    Satisfactory for evaluation; transformation zone component PRESENT.    01/07/24 pathology: . ENDOMETRIUM, POLYPECTOMY:  - Endometrial polyp with endometrial hyperplasia.  - Negative for atypia/EIN and malignancy.   B. ENDOMETRIUM, CURETTAGE:  - Fragments of poorly preserved proliferative endometrium.  - Negative for atypia/EIN and malignancy.   GYN HISTORY: Prior CS  OB History  Gravida Para Term Preterm AB Living  5 2 2  3 2   SAB IAB Ectopic Multiple Live Births  2  1  2     # Outcome Date GA Lbr Len/2nd Weight Sex Type Anes PTL Lv  5 SAB           4 SAB           3 Ectopic           2 Term      CS-Unspec   LIV  1 Term      CS-Unspec   LIV    Past Medical History:  Diagnosis Date   Allergy    Anemia    Arthritis    knees    Asthma    GERD (gastroesophageal reflux disease)    occasionally    Hypertension    Neuromuscular disorder (HCC)    sciatica left leg   Obesity    Prediabetes     Past Surgical History:  Procedure Laterality Date   CESAREAN SECTION     x2   COLONOSCOPY  05/02/2018   DILATATION & CURRETTAGE/HYSTEROSCOPY WITH RESECTOCOPE N/A 01/07/2024   Procedure: DILATATION & CURETTAGE/HYSTEROSCOPY WITH RESECTOCOPE;  Surgeon: Dallie Vera GAILS, MD;  Location: [facility] OR;  Service: Gynecology;  Laterality: N/AMERL MELINDA MELINDA  RESECTION N/A 01/07/2024   Procedure: MELINDA RESECTION;  Surgeon: Dallie Vera GAILS, MD;  Location: [facility] OR;  Service: Gynecology;  Laterality: N/A;    Current Outpatient Medications on File Prior to Visit  Medication Sig Dispense Refill   albuterol  (VENTOLIN  HFA) 108 (90 Base) MCG/ACT inhaler Inhale 1-2 puffs into the lungs every 6 (six) hours as needed. 18 g 2   budesonide -formoterol  (SYMBICORT ) 80-4.5 MCG/ACT inhaler Inhale 2 puffs into the lungs 2 (two) times daily. 10.2 g 6   lisinopril -hydrochlorothiazide  (ZESTORETIC ) 20-12.5 MG tablet Take 1 tablet by mouth daily. 90 tablet 3   Loratadine 10 MG CAPS Take by mouth.     MAGNESIUM PO Take by mouth.     Multiple Vitamins-Minerals (CENTRUM SILVER 50+WOMEN PO) Take 1 each by mouth daily.     rosuvastatin  (CRESTOR ) 20 MG tablet Take 1 tablet (20 mg total) by mouth daily. 90 tablet 3   semaglutide -weight management (WEGOVY ) 0.5 MG/0.5ML SOAJ SQ injection Inject 0.5 mg into the skin once a week. (Patient not taking: Reported on 04/10/2024) 2 mL 1   No current facility-administered medications on file prior to visit.    Allergies  Allergen Reactions   Penicillins  Freezes up my system- gets the shakes and feels cold      PE Today's Vitals   04/10/24 1011  BP: 122/76  Pulse: 85  Temp: 98.4 F (36.9 C)  TempSrc: Oral  SpO2: 99%  Weight: 265 lb (120.2 kg)    Body mass index is 48.08 kg/m.  Physical Exam Vitals reviewed. Exam conducted with a chaperone present.  Constitutional:      General: She is not in acute distress.    Appearance: Normal appearance.  HENT:     Head: Normocephalic and atraumatic.     Nose: Nose normal.  Eyes:     Extraocular Movements: Extraocular movements intact.     Conjunctiva/sclera: Conjunctivae normal.  Pulmonary:     Effort: Pulmonary effort is normal.  Abdominal:     Tenderness: There is no abdominal tenderness.  Genitourinary:    General: Normal vulva.     Exam position: Lithotomy  position.     Vagina: Normal. No vaginal discharge.     Cervix: Normal. No cervical motion tenderness, discharge or lesion.     Uterus: Normal. Not enlarged and not tender.      Adnexa: Right adnexa normal and left adnexa normal.  Musculoskeletal:        General: Normal range of motion.     Cervical back: Normal range of motion.  Neurological:     General: No focal deficit present.     Mental Status: She is alert.  Psychiatric:        Mood and Affect: Mood normal.        Behavior: Behavior normal.   Procedure Consent was signed. Timeout was performed. Speculum inserted into the vagina, cervix visualized and was prepped with Betadine.  Cervical block was declined. A single-toothed tenaculum was placed on the anterior lip of the cervix to stabilize it.  The 3 mm pipelle was introduced into the endometrial cavity without difficulty to a depth of 10cm, suction initiated and a moderate amount of tissue was obtained over 2 passes and sent to pathology.  The instruments were removed from the patient's vagina.  Minimal bleeding from the cervix was noted.  The patient tolerated the procedure well.    Assessment and Plan:        Endometrial hyperplasia Assessment & Plan: Patient noting spotting despite consistent Megace  use Increase megace : 40mg  twice daily. Plan for EMB in 3-6 month during year 1 of surveillance Return office in 3 months for EMB. To call if bleeding persists.   Orders: -     Megestrol  Acetate; Take 1 tablet (40 mg total) by mouth 2 (two) times daily.  Dispense: 180 tablet; Refill: 3 -     Surgical pathology -     Endometrial biopsy; Future  Pre-procedure lab exam -     Pregnancy, urine  Vaginal odor -     WET PREP FOR TRICH, YEAST, CLUE   Vera LULLA Pa, MD

## 2024-04-12 LAB — SURGICAL PATHOLOGY

## 2024-04-21 ENCOUNTER — Telehealth: Payer: Self-pay

## 2024-04-21 NOTE — Telephone Encounter (Signed)
 Pt called in stating that she is still experiencing bleeding. She has been taking the Megestrol  but it hasn't stopped the bleeding.   She had a EMB done on 04/10/24 which came back benign.   She states that she does think that since the EMB the bleeding has increased. She denies any fever, chills, nausea, or vomiting.   She denies abdominal pain. She did say she has a sharpe pain in her pelvic area that comes and goes.   She said she is only having to change her pad 2 times a day.   She wanted to know what needs to be done about this bleeding.   Please advise    Pt is aware that [facility] isn't in the office today.

## 2024-04-21 NOTE — Telephone Encounter (Signed)
 Pt has been scheduled on 04/25/24, and is aware of that if she experiences and fever, chills, vomiting, or starts having heavy bleeding that she needs to go to the ED to be evaluated.

## 2024-04-21 NOTE — Telephone Encounter (Signed)
 Schedule OV with GH next week.

## 2024-04-25 ENCOUNTER — Ambulatory Visit (INDEPENDENT_AMBULATORY_CARE_PROVIDER_SITE_OTHER): Admitting: Obstetrics and Gynecology

## 2024-04-25 ENCOUNTER — Encounter: Payer: Self-pay | Admitting: Obstetrics and Gynecology

## 2024-04-25 VITALS — BP 124/78 | HR 94 | Temp 99.2°F | Wt 267.0 lb

## 2024-04-25 DIAGNOSIS — N950 Postmenopausal bleeding: Secondary | ICD-10-CM

## 2024-04-25 DIAGNOSIS — N8500 Endometrial hyperplasia, unspecified: Secondary | ICD-10-CM | POA: Diagnosis not present

## 2024-04-25 NOTE — Progress Notes (Signed)
 58 y.o. [id number] postmenopausal female with PMB/endometrial thickness/endometrial polyp, endometrial hyperplasia (diagnosed on 01/07/24 hysteroscopy), asthma, HTN (no meds), HLD here for ongoing PMB. Single.   Patient was initially evaluated by DOROTHA Bland, NP.  Patient's last menstrual period was 10/05/2016 (approximate).      Component Value Date/Time   DIAGPAP  06/21/2023 1605    - Negative for intraepithelial lesion or malignancy (NILM)   HPVHIGH Negative 06/21/2023 1605   ADEQPAP  06/21/2023 1605    Satisfactory for evaluation; transformation zone component PRESENT.    01/07/24 pathology: . ENDOMETRIUM, POLYPECTOMY:  - Endometrial polyp with endometrial hyperplasia.  - Negative for atypia/EIN and malignancy.   B. ENDOMETRIUM, CURETTAGE:  - Fragments of poorly preserved proliferative endometrium.  - Negative for atypia/EIN and malignancy.   02/09/24 She was on megace  40mg  QD.  04/10/2024 EMB benign, however she reported daily spotting at this appointment.  Her Megace  was increased to 40 mg BID.  She was also treated for BV.  Today she reports HVB for 1 wk following EMB and light period for past week.  She wants her bleeding to stop.  GYN HISTORY: Prior CS  OB History  Gravida Para Term Preterm AB Living  5 2 2  3 2   SAB IAB Ectopic Multiple Live Births  2  1  2     # Outcome Date GA Lbr Len/2nd Weight Sex Type Anes PTL Lv  5 SAB           4 SAB           3 Ectopic           2 Term      CS-Unspec   LIV  1 Term      CS-Unspec   LIV    Past Medical History:  Diagnosis Date   Allergy    Anemia    Arthritis    knees    Asthma    GERD (gastroesophageal reflux disease)    occasionally    Hypertension    Neuromuscular disorder (HCC)    sciatica left leg   Obesity    Prediabetes     Past Surgical History:  Procedure Laterality Date   CESAREAN SECTION     x2   COLONOSCOPY  05/02/2018   DILATATION & CURRETTAGE/HYSTEROSCOPY WITH RESECTOCOPE N/A  01/07/2024   Procedure: DILATATION & CURETTAGE/HYSTEROSCOPY WITH RESECTOCOPE;  Surgeon: Dallie Vera GAILS, MD;  Location: [facility] OR;  Service: Gynecology;  Laterality: N/AMERL MELINDA MELINDA RESECTION N/A 01/07/2024   Procedure: MELINDA RESECTION;  Surgeon: Dallie Vera GAILS, MD;  Location: [facility] OR;  Service: Gynecology;  Laterality: N/A;    Current Outpatient Medications on File Prior to Visit  Medication Sig Dispense Refill   albuterol  (VENTOLIN  HFA) 108 (90 Base) MCG/ACT inhaler Inhale 1-2 puffs into the lungs every 6 (six) hours as needed. 18 g 2   budesonide -formoterol  (SYMBICORT ) 80-4.5 MCG/ACT inhaler Inhale 2 puffs into the lungs 2 (two) times daily. 10.2 g 6   lisinopril -hydrochlorothiazide  (ZESTORETIC ) 20-12.5 MG tablet Take 1 tablet by mouth daily. 90 tablet 3   Loratadine 10 MG CAPS Take by mouth.     MAGNESIUM PO Take by mouth.     megestrol  (MEGACE ) 40 MG tablet Take 1 tablet (40 mg total) by mouth 2 (two) times daily. 180 tablet 3   Multiple Vitamins-Minerals (CENTRUM SILVER 50+WOMEN PO) Take 1 each by mouth daily.     rosuvastatin  (CRESTOR ) 20 MG tablet Take 1 tablet (20 mg  total) by mouth daily. 90 tablet 3   No current facility-administered medications on file prior to visit.    Allergies  Allergen Reactions   Penicillins     Freezes up my system- gets the shakes and feels cold      PE Today's Vitals   04/25/24 0907  BP: 124/78  Pulse: 94  Temp: 99.2 F (37.3 C)  TempSrc: Oral  SpO2: 98%  Weight: 267 lb (121.1 kg)     Body mass index is 48.44 kg/m.  Physical Exam Vitals reviewed.  Constitutional:      General: She is not in acute distress.    Appearance: Normal appearance.  HENT:     Head: Normocephalic and atraumatic.     Nose: Nose normal.  Eyes:     Extraocular Movements: Extraocular movements intact.     Conjunctiva/sclera: Conjunctivae normal.  Pulmonary:     Effort: Pulmonary effort is normal.  Genitourinary:    Comments: Declines pelvic  exam Musculoskeletal:        General: Normal range of motion.     Cervical back: Normal range of motion.  Neurological:     General: No focal deficit present.     Mental Status: She is alert.  Psychiatric:        Mood and Affect: Mood normal.        Behavior: Behavior normal.     Assessment and Plan:        PMB (postmenopausal bleeding)  Endometrial hyperplasia Assessment & Plan: 02/09/24 Started megace  40mg  QD. 04/10/2024 EMB benign, however she reported daily spotting. Megace  was increased to 40 mg BID Will increase megace  to 80mg  BID She is to check in after 1 week if bleeding has not improved.  Will increase to 120 mg twice a day at that time.  Patient is aware of appetite stimulation at this dose for Megace . Return office for q3 months EMB as scheduled     Vera LULLA Pa, MD

## 2024-04-25 NOTE — Assessment & Plan Note (Signed)
 02/09/24 Started megace  40mg  QD. 04/10/2024 EMB benign, however she reported daily spotting. Megace  was increased to 40 mg BID Will increase megace  to 80mg  BID She is to check in after 1 week if bleeding has not improved.  Will increase to 120 mg twice a day at that time.  Patient is aware of appetite stimulation at this dose for Megace . Return office for q3 months EMB as scheduled

## 2024-04-25 NOTE — Patient Instructions (Signed)
 Increased megace  to 2 tablets (80mg ) twice a day.

## 2024-05-01 ENCOUNTER — Telehealth: Payer: Self-pay

## 2024-05-01 DIAGNOSIS — N8500 Endometrial hyperplasia, unspecified: Secondary | ICD-10-CM

## 2024-05-01 DIAGNOSIS — N950 Postmenopausal bleeding: Secondary | ICD-10-CM

## 2024-05-01 NOTE — Telephone Encounter (Signed)
 Patient agreeable to try to increase megace  to 120 mg bid for one week. She requested if this does not work could she try something else.

## 2024-05-01 NOTE — Telephone Encounter (Signed)
 Patient called this morning stating that nothing has changed with the increase of the megace  to 80mg . Per office note from 04/25/24 patient was to check in, in one week and if spotting persisted patient was to increase Megace  to 120mg   bid. Ok to proceed with this plan?

## 2024-05-10 ENCOUNTER — Other Ambulatory Visit (HOSPITAL_COMMUNITY): Payer: Self-pay

## 2024-05-10 MED ORDER — MEDROXYPROGESTERONE ACETATE 10 MG PO TABS
20.0000 mg | ORAL_TABLET | Freq: Every day | ORAL | 3 refills | Status: AC
  Filled 2024-05-10 – 2024-05-11 (×2): qty 180, 90d supply, fill #0

## 2024-05-10 NOTE — Telephone Encounter (Signed)
 Given symptoms and high dose, let's switch to MPA 20mg  every day. Stop megace  Agree with ED precautions if symptoms return.  She may benefit from Mirena IUD if symptoms are not controlled within 30d.

## 2024-05-10 NOTE — Telephone Encounter (Signed)
 Spoke with patient, calling to provide update. Patient increased Megace  to 120 mg bid as previously instructed, started on 05/01/24.   Patient reports on Thursday night (05/04/24) she experienced headache, nausea, room spinning and heart going to beat out of her chest. States she laid down and calmed down, symptoms resolved. States she did not take Megace  Friday, Saturday or Sunday. States she was uncertain if symptoms were medication related. ER precautions advised for future if symptoms return.   She restarted Megace  on 05/08/24.  Reports bleeding continues, flow varies with activity. Currently changing pad 1-2 times per day. Advised starting and stopping medication could also cause some irregular bleeding. Advised I will provide update to Dr. Dallie and our office will f/u with recommendations, patient agreeable.  Routing to Dr. Dallie

## 2024-05-10 NOTE — Addendum Note (Signed)
 Addended by: DALLIE VERA GAILS on: 05/10/2024 05:23 PM   Modules accepted: Orders

## 2024-05-11 ENCOUNTER — Other Ambulatory Visit (HOSPITAL_COMMUNITY): Payer: Self-pay

## 2024-05-11 ENCOUNTER — Other Ambulatory Visit: Payer: Self-pay

## 2024-05-11 NOTE — Telephone Encounter (Signed)
 Spoke with patient, advised as seen below per Dr. Dallie. Patient verbalizes understanding and is agreeable.   Encounter closed.

## 2024-07-11 ENCOUNTER — Ambulatory Visit: Payer: Medicare (Managed Care) | Admitting: Obstetrics and Gynecology

## 2024-07-26 ENCOUNTER — Telehealth: Payer: Self-pay | Admitting: *Deleted

## 2024-07-26 NOTE — Telephone Encounter (Signed)
-----   Message from [place] [facility] sent at 07/11/2024  8:37 AM EST ----- She cancelled her procedure for today and will call the office back to reschedule.

## 2024-07-26 NOTE — Telephone Encounter (Signed)
 Spoke with patient, advised per Dr. Dallie.  Patient agreeable to proceed with EMB in office, request morning appt, after weather cleared. Scheduled for 08/14/24 at 0830 with Dr. Dallie. Instructed to take Motrin  800 mg with food and water 30 min before procedure.   Routing to provider for final review. Patient is agreeable to disposition. Will close encounter.

## 2024-07-26 NOTE — Telephone Encounter (Signed)
 Spoke with patient. Patient states barriers to scheduling are the weather and concerns about bleeding after procedure.   Patient states bleeding after previous EMB lasted for months after, flow varied from spotting to continuous flow. Patient asking if EMB is necessary? Patient asking if there are other options?   Advised I will forward concerns to Dr. Dallie to review. Patient agreeable.

## 2024-08-14 ENCOUNTER — Ambulatory Visit: Admitting: Obstetrics and Gynecology
